# Patient Record
Sex: Female | Born: 1970 | Race: Black or African American | Hispanic: No | Marital: Married | Smoking: Never smoker
Health system: Southern US, Community
[De-identification: ages and names within clinical notes are randomized; demographics above are authoritative.]

## PROBLEM LIST (undated history)

## (undated) DIAGNOSIS — N201 Calculus of ureter: Secondary | ICD-10-CM

## (undated) DIAGNOSIS — F419 Anxiety disorder, unspecified: Secondary | ICD-10-CM

## (undated) DIAGNOSIS — K5900 Constipation, unspecified: Secondary | ICD-10-CM

## (undated) DIAGNOSIS — K589 Irritable bowel syndrome without diarrhea: Secondary | ICD-10-CM

## (undated) DIAGNOSIS — R0600 Dyspnea, unspecified: Secondary | ICD-10-CM

## (undated) DIAGNOSIS — R350 Frequency of micturition: Secondary | ICD-10-CM

## (undated) DIAGNOSIS — Z8619 Personal history of other infectious and parasitic diseases: Secondary | ICD-10-CM

## (undated) DIAGNOSIS — K219 Gastro-esophageal reflux disease without esophagitis: Secondary | ICD-10-CM

## (undated) DIAGNOSIS — T7840XA Allergy, unspecified, initial encounter: Secondary | ICD-10-CM

## (undated) DIAGNOSIS — K5909 Other constipation: Secondary | ICD-10-CM

## (undated) DIAGNOSIS — J302 Other seasonal allergic rhinitis: Secondary | ICD-10-CM

## (undated) DIAGNOSIS — D649 Anemia, unspecified: Secondary | ICD-10-CM

## (undated) DIAGNOSIS — N189 Chronic kidney disease, unspecified: Secondary | ICD-10-CM

## (undated) DIAGNOSIS — D509 Iron deficiency anemia, unspecified: Secondary | ICD-10-CM

## (undated) HISTORY — PX: OTHER SURGICAL HISTORY: SHX169

## (undated) HISTORY — DX: Chronic kidney disease, unspecified: N18.9

## (undated) HISTORY — PX: TUBAL LIGATION: SHX77

## (undated) HISTORY — PX: DILATION AND CURETTAGE OF UTERUS: SHX78

## (undated) HISTORY — DX: Anxiety disorder, unspecified: F41.9

## (undated) HISTORY — DX: Irritable bowel syndrome, unspecified: K58.9

## (undated) HISTORY — DX: Allergy, unspecified, initial encounter: T78.40XA

## (undated) HISTORY — DX: Constipation, unspecified: K59.00

## (undated) HISTORY — DX: Anemia, unspecified: D64.9

## (undated) HISTORY — PX: BREAST LUMPECTOMY: SHX2

## (undated) HISTORY — DX: Gastro-esophageal reflux disease without esophagitis: K21.9

---
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
## 1997-07-23 ENCOUNTER — Other Ambulatory Visit: Admission: RE | Admit: 1997-07-23 | Discharge: 1997-07-23 | Payer: Self-pay | Admitting: Obstetrics and Gynecology

## 1997-07-23 ENCOUNTER — Inpatient Hospital Stay (HOSPITAL_COMMUNITY): Admission: AD | Admit: 1997-07-23 | Discharge: 1997-07-23 | Payer: Self-pay | Admitting: *Deleted

## 1997-08-07 ENCOUNTER — Other Ambulatory Visit: Admission: RE | Admit: 1997-08-07 | Discharge: 1997-08-07 | Payer: Self-pay | Admitting: Obstetrics & Gynecology

## 1997-11-29 ENCOUNTER — Inpatient Hospital Stay (HOSPITAL_COMMUNITY): Admission: AD | Admit: 1997-11-29 | Discharge: 1997-11-29 | Payer: Self-pay | Admitting: Obstetrics and Gynecology

## 1998-01-22 ENCOUNTER — Encounter: Payer: Self-pay | Admitting: Obstetrics & Gynecology

## 1998-01-22 ENCOUNTER — Ambulatory Visit (HOSPITAL_COMMUNITY): Admission: RE | Admit: 1998-01-22 | Discharge: 1998-01-22 | Payer: Self-pay | Admitting: Obstetrics & Gynecology

## 1998-02-16 ENCOUNTER — Inpatient Hospital Stay (HOSPITAL_COMMUNITY): Admission: AD | Admit: 1998-02-16 | Discharge: 1998-02-16 | Payer: Self-pay | Admitting: Obstetrics & Gynecology

## 1998-02-17 ENCOUNTER — Inpatient Hospital Stay (HOSPITAL_COMMUNITY): Admission: AD | Admit: 1998-02-17 | Discharge: 1998-02-19 | Payer: Self-pay | Admitting: Obstetrics & Gynecology

## 1998-03-10 ENCOUNTER — Encounter (HOSPITAL_BASED_OUTPATIENT_CLINIC_OR_DEPARTMENT_OTHER): Payer: Self-pay | Admitting: General Surgery

## 1998-03-12 ENCOUNTER — Ambulatory Visit (HOSPITAL_COMMUNITY): Admission: RE | Admit: 1998-03-12 | Discharge: 1998-03-12 | Payer: Self-pay | Admitting: General Surgery

## 1998-08-19 ENCOUNTER — Emergency Department (HOSPITAL_COMMUNITY): Admission: EM | Admit: 1998-08-19 | Discharge: 1998-08-19 | Payer: Self-pay

## 1998-09-09 ENCOUNTER — Encounter: Payer: Self-pay | Admitting: Emergency Medicine

## 1998-09-09 ENCOUNTER — Emergency Department (HOSPITAL_COMMUNITY): Admission: EM | Admit: 1998-09-09 | Discharge: 1998-09-09 | Payer: Self-pay | Admitting: Emergency Medicine

## 1998-10-15 ENCOUNTER — Encounter: Admission: RE | Admit: 1998-10-15 | Discharge: 1998-10-15 | Payer: Self-pay | Admitting: Family Medicine

## 1998-10-28 ENCOUNTER — Encounter: Admission: RE | Admit: 1998-10-28 | Discharge: 1998-10-28 | Payer: Self-pay | Admitting: Family Medicine

## 1998-12-19 ENCOUNTER — Encounter: Admission: RE | Admit: 1998-12-19 | Discharge: 1998-12-19 | Payer: Self-pay | Admitting: Family Medicine

## 1999-01-13 ENCOUNTER — Encounter: Admission: RE | Admit: 1999-01-13 | Discharge: 1999-01-13 | Payer: Self-pay | Admitting: Sports Medicine

## 1999-02-04 ENCOUNTER — Encounter: Admission: RE | Admit: 1999-02-04 | Discharge: 1999-02-04 | Payer: Self-pay | Admitting: Family Medicine

## 1999-02-04 ENCOUNTER — Encounter: Payer: Self-pay | Admitting: Sports Medicine

## 1999-02-11 ENCOUNTER — Encounter: Admission: RE | Admit: 1999-02-11 | Discharge: 1999-02-11 | Payer: Self-pay | Admitting: Family Medicine

## 1999-08-13 ENCOUNTER — Encounter: Admission: RE | Admit: 1999-08-13 | Discharge: 1999-08-13 | Payer: Self-pay | Admitting: Family Medicine

## 2000-02-26 ENCOUNTER — Encounter: Payer: Self-pay | Admitting: Obstetrics and Gynecology

## 2000-02-26 ENCOUNTER — Inpatient Hospital Stay (HOSPITAL_COMMUNITY): Admission: AD | Admit: 2000-02-26 | Discharge: 2000-02-26 | Payer: Self-pay | Admitting: Obstetrics & Gynecology

## 2000-04-05 ENCOUNTER — Other Ambulatory Visit: Admission: RE | Admit: 2000-04-05 | Discharge: 2000-04-05 | Payer: Self-pay | Admitting: Obstetrics and Gynecology

## 2000-09-05 ENCOUNTER — Inpatient Hospital Stay (HOSPITAL_COMMUNITY): Admission: AD | Admit: 2000-09-05 | Discharge: 2000-09-05 | Payer: Self-pay | Admitting: Obstetrics and Gynecology

## 2000-09-09 ENCOUNTER — Inpatient Hospital Stay (HOSPITAL_COMMUNITY): Admission: AD | Admit: 2000-09-09 | Discharge: 2000-09-11 | Payer: Self-pay | Admitting: Obstetrics and Gynecology

## 2000-09-09 ENCOUNTER — Encounter (INDEPENDENT_AMBULATORY_CARE_PROVIDER_SITE_OTHER): Payer: Self-pay

## 2001-08-21 ENCOUNTER — Encounter: Admission: RE | Admit: 2001-08-21 | Discharge: 2001-08-21 | Payer: Self-pay | Admitting: Family Medicine

## 2001-10-06 ENCOUNTER — Encounter: Admission: RE | Admit: 2001-10-06 | Discharge: 2001-10-06 | Payer: Self-pay | Admitting: Family Medicine

## 2002-01-16 ENCOUNTER — Emergency Department (HOSPITAL_COMMUNITY): Admission: EM | Admit: 2002-01-16 | Discharge: 2002-01-16 | Payer: Self-pay | Admitting: Emergency Medicine

## 2002-01-16 ENCOUNTER — Encounter: Payer: Self-pay | Admitting: *Deleted

## 2002-05-16 ENCOUNTER — Encounter: Admission: RE | Admit: 2002-05-16 | Discharge: 2002-05-16 | Payer: Self-pay | Admitting: Sports Medicine

## 2002-09-03 ENCOUNTER — Encounter: Admission: RE | Admit: 2002-09-03 | Discharge: 2002-09-03 | Payer: Self-pay | Admitting: Family Medicine

## 2002-11-29 ENCOUNTER — Encounter: Admission: RE | Admit: 2002-11-29 | Discharge: 2002-11-29 | Payer: Self-pay | Admitting: Family Medicine

## 2003-03-11 ENCOUNTER — Ambulatory Visit (HOSPITAL_COMMUNITY): Admission: RE | Admit: 2003-03-11 | Discharge: 2003-03-11 | Payer: Self-pay | Admitting: Family Medicine

## 2003-03-11 ENCOUNTER — Encounter: Admission: RE | Admit: 2003-03-11 | Discharge: 2003-03-11 | Payer: Self-pay | Admitting: Family Medicine

## 2003-06-28 ENCOUNTER — Encounter: Admission: RE | Admit: 2003-06-28 | Discharge: 2003-06-28 | Payer: Self-pay | Admitting: Family Medicine

## 2004-03-25 ENCOUNTER — Ambulatory Visit: Payer: Self-pay | Admitting: Sports Medicine

## 2004-05-09 ENCOUNTER — Emergency Department (HOSPITAL_COMMUNITY): Admission: EM | Admit: 2004-05-09 | Discharge: 2004-05-09 | Payer: Self-pay | Admitting: Emergency Medicine

## 2004-05-18 ENCOUNTER — Ambulatory Visit: Payer: Self-pay | Admitting: Family Medicine

## 2005-04-01 ENCOUNTER — Encounter (INDEPENDENT_AMBULATORY_CARE_PROVIDER_SITE_OTHER): Payer: Self-pay | Admitting: *Deleted

## 2005-04-28 ENCOUNTER — Ambulatory Visit: Payer: Self-pay | Admitting: Family Medicine

## 2005-06-01 ENCOUNTER — Ambulatory Visit: Payer: Self-pay | Admitting: Family Medicine

## 2006-02-21 ENCOUNTER — Ambulatory Visit: Payer: Self-pay

## 2006-03-31 DIAGNOSIS — F411 Generalized anxiety disorder: Secondary | ICD-10-CM | POA: Insufficient documentation

## 2006-04-01 ENCOUNTER — Encounter (INDEPENDENT_AMBULATORY_CARE_PROVIDER_SITE_OTHER): Payer: Self-pay | Admitting: *Deleted

## 2006-06-08 ENCOUNTER — Ambulatory Visit: Payer: Self-pay | Admitting: Family Medicine

## 2006-06-08 ENCOUNTER — Encounter: Payer: Self-pay | Admitting: Family Medicine

## 2006-06-08 ENCOUNTER — Telehealth: Payer: Self-pay | Admitting: *Deleted

## 2006-06-08 DIAGNOSIS — N946 Dysmenorrhea, unspecified: Secondary | ICD-10-CM | POA: Insufficient documentation

## 2006-06-08 DIAGNOSIS — E669 Obesity, unspecified: Secondary | ICD-10-CM | POA: Insufficient documentation

## 2006-06-08 LAB — CONVERTED CEMR LAB
Cholesterol: 163 mg/dL (ref 0–200)
HCT: 35.9 %
Hemoglobin: 12 g/dL
MCV: 82.7 fL
Platelets: 212 10*3/uL
RBC: 4.34 M/uL

## 2006-06-15 ENCOUNTER — Encounter: Payer: Self-pay | Admitting: Family Medicine

## 2006-07-06 ENCOUNTER — Telehealth: Payer: Self-pay | Admitting: *Deleted

## 2006-07-11 ENCOUNTER — Ambulatory Visit: Payer: Self-pay | Admitting: Sports Medicine

## 2006-07-11 ENCOUNTER — Encounter: Payer: Self-pay | Admitting: Family Medicine

## 2006-07-11 LAB — CONVERTED CEMR LAB: TSH: 1.621 microintl units/mL (ref 0.350–5.50)

## 2007-06-16 ENCOUNTER — Encounter (INDEPENDENT_AMBULATORY_CARE_PROVIDER_SITE_OTHER): Payer: Self-pay | Admitting: Family Medicine

## 2007-06-16 ENCOUNTER — Encounter: Payer: Self-pay | Admitting: Family Medicine

## 2007-06-16 ENCOUNTER — Ambulatory Visit: Payer: Self-pay | Admitting: Family Medicine

## 2007-06-16 DIAGNOSIS — L659 Nonscarring hair loss, unspecified: Secondary | ICD-10-CM | POA: Insufficient documentation

## 2007-06-16 DIAGNOSIS — N920 Excessive and frequent menstruation with regular cycle: Secondary | ICD-10-CM

## 2007-06-16 LAB — CONVERTED CEMR LAB
Hemoglobin: 12.2 g/dL
TSH: 1.886 microintl units/mL (ref 0.350–5.50)

## 2007-06-20 ENCOUNTER — Encounter: Payer: Self-pay | Admitting: Family Medicine

## 2007-06-20 ENCOUNTER — Ambulatory Visit (HOSPITAL_COMMUNITY): Admission: RE | Admit: 2007-06-20 | Discharge: 2007-06-20 | Payer: Self-pay | Admitting: Family Medicine

## 2007-06-22 ENCOUNTER — Telehealth: Payer: Self-pay | Admitting: Family Medicine

## 2007-10-30 ENCOUNTER — Telehealth: Payer: Self-pay | Admitting: *Deleted

## 2007-11-02 ENCOUNTER — Ambulatory Visit: Payer: Self-pay | Admitting: Family Medicine

## 2008-07-03 ENCOUNTER — Ambulatory Visit: Payer: Self-pay | Admitting: Family Medicine

## 2008-07-03 ENCOUNTER — Encounter (INDEPENDENT_AMBULATORY_CARE_PROVIDER_SITE_OTHER): Payer: Self-pay | Admitting: Family Medicine

## 2008-07-03 ENCOUNTER — Encounter: Payer: Self-pay | Admitting: Family Medicine

## 2008-07-08 ENCOUNTER — Ambulatory Visit (HOSPITAL_COMMUNITY): Admission: RE | Admit: 2008-07-08 | Discharge: 2008-07-08 | Payer: Self-pay | Admitting: Family Medicine

## 2008-07-08 ENCOUNTER — Encounter: Payer: Self-pay | Admitting: Family Medicine

## 2008-07-09 ENCOUNTER — Ambulatory Visit: Payer: Self-pay | Admitting: Family Medicine

## 2008-07-09 ENCOUNTER — Encounter: Payer: Self-pay | Admitting: Family Medicine

## 2008-07-11 ENCOUNTER — Telehealth: Payer: Self-pay | Admitting: *Deleted

## 2008-07-24 ENCOUNTER — Encounter: Admission: RE | Admit: 2008-07-24 | Discharge: 2008-07-24 | Payer: Self-pay | Admitting: Family Medicine

## 2008-08-12 ENCOUNTER — Encounter: Payer: Self-pay | Admitting: Family Medicine

## 2008-08-29 ENCOUNTER — Telehealth (INDEPENDENT_AMBULATORY_CARE_PROVIDER_SITE_OTHER): Payer: Self-pay | Admitting: Family Medicine

## 2009-04-12 ENCOUNTER — Emergency Department (HOSPITAL_COMMUNITY): Admission: EM | Admit: 2009-04-12 | Discharge: 2009-04-12 | Payer: Self-pay | Admitting: Emergency Medicine

## 2009-04-12 ENCOUNTER — Telehealth: Payer: Self-pay | Admitting: Family Medicine

## 2009-04-23 ENCOUNTER — Ambulatory Visit: Payer: Self-pay | Admitting: Family Medicine

## 2009-04-23 ENCOUNTER — Ambulatory Visit (HOSPITAL_COMMUNITY): Admission: RE | Admit: 2009-04-23 | Discharge: 2009-04-23 | Payer: Self-pay | Admitting: Family Medicine

## 2009-04-23 DIAGNOSIS — R002 Palpitations: Secondary | ICD-10-CM | POA: Insufficient documentation

## 2009-04-29 ENCOUNTER — Encounter: Payer: Self-pay | Admitting: Family Medicine

## 2009-07-24 ENCOUNTER — Ambulatory Visit: Payer: Self-pay | Admitting: Family Medicine

## 2009-07-24 ENCOUNTER — Encounter: Payer: Self-pay | Admitting: Family Medicine

## 2009-08-14 ENCOUNTER — Ambulatory Visit: Payer: Self-pay | Admitting: Family Medicine

## 2009-08-14 ENCOUNTER — Encounter: Payer: Self-pay | Admitting: Family Medicine

## 2010-03-05 NOTE — Letter (Signed)
Summary: Generic Letter  [facility]  [address]   [place], [place] [address]   Phone: [phone]  Fax: [phone]    08/14/2009  MERRILLYN ACKERLEY [address] [place], [place]  [address]  To Whom It May Concern Re: Ms Giamarie Bueche Tom-Jonhson:  Ms Laural Benes may return to work at full duty on July 25th, 2011 which is earlier than expected from previous FMLA.  If you need further information please do not hesitate to contact Ms Tom-Johnson or the [facility].     Sincerely,   Ancil Boozer  MD

## 2010-03-05 NOTE — Assessment & Plan Note (Signed)
Summary: fmla   Vital Signs:  Patient profile:   40 year old female Height:      64.5 inches Weight:      196.5 pounds BMI:     33.33 Temp:     98.6 degrees F oral Pulse rate:   83 / minute BP sitting:   108 / 70  (left arm) Cuff size:   regular  Vitals Entered By: Garen Grams LPN (July 24, 2009 8:45 AM) CC: depression, anxiety Is Patient Diabetic? No Pain Assessment Patient in pain? no        Primary Care Provider:  Ancil Boozer  MD  CC:  depression and anxiety.  History of Present Illness: depression/anxiety: has worsened recently with several stressors: husband has had to go back to [place] until end of july due to illness in his father Tamara "Daphne"'s father in law) and she now is home alone with the kids and has no one to watch them when she is suppsed to work.  she also has nursing boards coming up soon.  as a result she's had increase in her panic attacks which consisted of chest pain primarily - this is relieved with xanax use periodically but she prefers to not use it.  she also has noticed down mood, decreased energy, early fatigue, only fair sleep (and requiries a glass of wine to get to sleep) and she has been doing nothing for herself that she is interested in. she denies guilt just feels very overwhelmed.  she denies suicidal or homocidal ideation. she requests FMLA paperwork completed today to get some time off to start medication, potentially get counseling and to rest so that her depression/anxiety can calm down some.   Habits & Providers  Alcohol-Tobacco-Diet     Tobacco Status: never  Current Medications (verified): 1)  Fluticasone Propionate 50 Mcg/act Susp (Fluticasone Propionate) .... 2 Sprays Each Nostril Daily For Allergies/congestion 2)  Cetirizine Hcl 10 Mg Tabs (Cetirizine Hcl) .Marland Kitchen.. 1 By Mouth Once Daily For Allergies. 3)  Alprazolam 0.5 Mg Tabs (Alprazolam) .Marland Kitchen.. 1 By Mouth Three Times A Day As Needed Severe Anxiety/panic Attack 4)  Citalopram  Hydrobromide 40 Mg Tabs (Citalopram Hydrobromide) .... Titrate Up As Directed To Goal 1 Tablet Once Daily  Allergies (verified): No Known Drug Allergies  Past History:  Past Medical History: cervical mass 1X1cm at 3 o`clock 03/07,  h/o anal fissure,  H/o chronic pelvic pain,  SVD x 3, SAB x 2 ALOPECIA (ICD-704.00) MENORRHAGIA (ICD-626.2) OBESITY (ICD-278.00) ANXIETY (ICD-300.00) with panic attack NONSPEC REACT TUBERCULIN SKN TEST W/O ACTV TB (ICD-795.5)   h/o med trials for depression/anxiety: prozac, lexapro, effexor.  discontinued due to side effects  Review of Systems       per HPI  Physical Exam  General:  Well appearing, overweight.  VS noted.  anxious and overwhelmed appearing   Impression & Recommendations:  Problem # 1:  ANXIETY (ICD-300.00) Assessment Deteriorated  FMLA completed for being off until 08/31/09.   start citalopram today encouraged counseling (card given to schedule with Dr Pascal Lux) f/u 3-4 weeks or sooner if worsening  of a 20 minute visit 17 minutes spent on counseling and coordination of care   Her updated medication list for this problem includes:    Alprazolam 0.5 Mg Tabs (Alprazolam) .Marland Kitchen... 1 by mouth three times a day as needed severe anxiety/panic attack    Citalopram Hydrobromide 40 Mg Tabs (Citalopram hydrobromide) .Marland Kitchen... Titrate up as directed to goal 1 tablet once daily  Orders: [facility]- Est  Level  3 ([address]213)  Complete Medication List: 1)  Fluticasone Propionate 50 Mcg/act Susp (Fluticasone propionate) .... 2 sprays each nostril daily for allergies/congestion 2)  Cetirizine Hcl 10 Mg Tabs (Cetirizine hcl) .Marland Kitchen.. 1 by mouth once daily for allergies. 3)  Alprazolam 0.5 Mg Tabs (Alprazolam) .Marland Kitchen.. 1 by mouth three times a day as needed severe anxiety/panic attack 4)  Citalopram Hydrobromide 40 Mg Tabs (Citalopram hydrobromide) .... Titrate up as directed to goal 1 tablet once daily  Patient Instructions: 1)  Please get counseling with Dr Pascal Lux.   Her card is attached.  2)  Start the medicine as prescribed. 3)  Please follow up with me in 3-4 weeks to see how your mood is doing.  Prescriptions: CITALOPRAM HYDROBROMIDE 40 MG TABS (CITALOPRAM HYDROBROMIDE) titrate up as directed to goal 1 tablet once daily  #30 x 2   Entered and Authorized by:   Ancil Boozer  MD   Signed by:   Ancil Boozer  MD on 07/24/2009   Method used:   Handwritten   RxID:   [id number]

## 2010-03-05 NOTE — Assessment & Plan Note (Signed)
Summary: F/U/KH   Vital Signs:  Patient profile:   40 year old female Weight:      197 pounds BMI:     33.41 Temp:     98.4 degrees F oral Pulse rate:   80 / minute BP sitting:   111 / 70  (left arm) Cuff size:   large  Vitals Entered By: Jimmy Footman, CMA (August 14, 2009 9:35 AM) CC: f/u mood Is Patient Diabetic? No Pain Assessment Patient in pain? no        Primary Care Provider:  Ancil Boozer  MD  CC:  f/u mood.  History of Present Illness: depression/anxiety: overall improving.  husband is now back.  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  she never took prescribed medication.  she still is having some problems sleeping but these have been chronic.  she does report that she is often watching TV in bed or sleeping with the TV on.  she denies SI/HI. denies feeling guilty over anything.    Habits & Providers  Alcohol-Tobacco-Diet     Tobacco Status: never  Current Medications (verified): 1)  Fluticasone Propionate 50 Mcg/act Susp (Fluticasone Propionate) .... 2 Sprays Each Nostril Daily For Allergies/congestion 2)  Cetirizine Hcl 10 Mg Tabs (Cetirizine Hcl) .Marland Kitchen.. 1 By Mouth Once Daily For Allergies. 3)  Alprazolam 0.5 Mg Tabs (Alprazolam) .Marland Kitchen.. 1 By Mouth Three Times A Day As Needed Severe Anxiety/panic Attack  Allergies (verified): No Known Drug Allergies  Past History:  Past medical, surgical, family and social histories (including risk factors) reviewed for relevance to current acute and chronic problems.  Past Medical History: cervical mass 1X1cm at 3 o`clock 03/07,  h/o anal fissure,  H/o chronic pelvic pain,  SVD x 3, SAB x 2 ALOPECIA (ICD-704.00) MENORRHAGIA (ICD-626.2) OBESITY (ICD-278.00) ANXIETY (ICD-300.00) with panic attack, palpitations NONSPEC REACT TUBERCULIN SKN TEST W/O ACTV TB (ICD-795.5)   h/o med trials for depression/anxiety: prozac, lexapro, effexor.  discontinued due to side effects.    Past Surgical History: Reviewed  history from 03/31/2006 and no changes required. BTL -, Cr = 0.9 - 03/04/2004, ECG: normal - 03/05/2003, Excision of L breast lump--benign -, Mandible surgery -  Family History: Reviewed history from 03/31/2006 and no changes required. 1 half-brother, 1 half-sister on Mom`s side--healthy, 2 half-brother, 1 half-sister on Dad`s side--healthy, Children--healthy, Dad--glaucoma, Maternal aunts--1 died of breast cancer at 6; 1 died of ovarian cancer at 43, Mom--glaucoma, No FHx of CAD, PGM--died of pancreatic cancer  Social History: Reviewed history from 06/16/2007 and no changes required. Lives with husband, 3 children (born in 2002, 2000, 1998); Works full-time in [employer]; Goes to school full-time for nursing [employer]); From [place] in 1995--Mother now in the states with a visa, no tobacco or illicit drugs.  daily glass of red wine to help with sleep  Review of Systems       per HPI  Physical Exam  General:  Well appearing, overweight.  VS noted.  less anxious appearing.   Psych:  Oriented X3, normally interactive, good eye contact, and mildly anxious   Impression & Recommendations:  Problem # 1:  ANXIETY (ICD-300.00) Assessment Improved  continued to encourage counseling.  discussed good sleep hygiene.    completed note for return to work 1 week earlier than anticipated on previous FMLA paperwork.   of a 15 minute visit entire visit was spent on counseling and coordiation of care.   Her updated medication list for this problem includes:  Alprazolam 0.5 Mg Tabs (Alprazolam) .Marland Kitchen... 1 by mouth three times a day as needed severe anxiety/panic attack  Orders: [facility]- Est Level  3 ([address])  Complete Medication List: 1)  Fluticasone Propionate 50 Mcg/act Susp (Fluticasone propionate) .... 2 sprays each nostril daily for allergies/congestion 2)  Cetirizine Hcl 10 Mg Tabs (Cetirizine hcl) .Marland Kitchen.. 1 by mouth once daily for allergies. 3)  Alprazolam 0.5 Mg Tabs (Alprazolam) .Marland Kitchen.. 1  by mouth three times a day as needed severe anxiety/panic attack  Patient Instructions: 1)  I am glad many of your stressors are winding down.  It will be good for you to get back to work.  2)  For your sleep: be sure you are in a quiet dark room.  No TV or reading in the bedroom. - let your mind rest! 3)  Your new doctor is Dr Alvia Grove.

## 2010-03-05 NOTE — Progress Notes (Signed)
Summary: ED visit   Phone Note Other Incoming   Caller: Irving Burton PA at [facility] ED Summary of Call: Pt presented to ED today with c/o chest pain x 1 week and palpataions.  ED doc thought c/p prob due to anxiety, but pt did have a brief, witnessed (less than 10 sec) run of tachycardia with HR  ~150's.  ED doc did not want to admit pt, but thought f/u and possibly holter monitor was appropriate.  All labs and PE WNL.  Pt insttructed to f/u with PCP.

## 2010-03-05 NOTE — Assessment & Plan Note (Signed)
Summary: chest discomfort,df   Vital Signs:  Patient profile:   40 year old female Height:      64.5 inches Weight:      203.2 pounds BMI:     34.46 Temp:     98.9 degrees F oral Pulse rate:   103 / minute BP sitting:   104 / 68  (right arm) Cuff size:   regular  Vitals Entered By: Garen Grams LPN (April 23, 2009 11:13 AM) CC: chest discomfort on/off Is Patient Diabetic? No Pain Assessment Patient in pain? no        Primary Care Provider:  Ancil Boozer  MD  CC:  chest discomfort on/off.  History of Present Illness: chest discomfort: intermittent.  has been going on now for about 2 months that he is noticed.  has happened about 3-4 times in total.  lasts only a few minutes.  symptoms consist of palpitations, lightheadedness, chest pain that radiates to left scapula and is stabbing in nature, shortness of breath, left arm numbness and tingling.  she denies nausea.  she has noticed palpitations with exercise but no chest pain.  seems to occur with worsening stress - she is under a great deal between work, school and family but states she is safe at home.  to help her pain she has tried aspirin, relaxation techniques such as deep breathing.  she has been seen once in ER and report and lab work has been reviewed - all of which was normal and she was dx with anxiety/panic.  she was prescribed xanax which has helped.  she denies family history of heart attacks or cardiac disease but does endorce family history of anxiety/depression  Habits & Providers  Alcohol-Tobacco-Diet     Tobacco Status: never  Current Medications (verified): 1)  Fluticasone Propionate 50 Mcg/act Susp (Fluticasone Propionate) .... 2 Sprays Each Nostril Daily For Allergies/congestion 2)  Cetirizine Hcl 10 Mg Tabs (Cetirizine Hcl) .Marland Kitchen.. 1 By Mouth Once Daily For Allergies. 3)  Alprazolam 0.5 Mg Tabs (Alprazolam) .Marland Kitchen.. 1 By Mouth Three Times A Day As Needed Severe Anxiety/panic Attack  Allergies (verified): No  Known Drug Allergies  Past History:  Past medical, surgical, family and social histories (including risk factors) reviewed for relevance to current acute and chronic problems.  Review of Systems       per HPI.  no abdominal pain.,  no changes in bowel habits.  no fevers.   Physical Exam  General:  Well appearing, overweight.  VS noted - very mild tachycardia Lungs:  normal respiratory effort and no intercostal retractions.   Heart:  normal rate, regular rhythm, and no gallop.     Impression & Recommendations:  Problem # 1:  PALPITATIONS (ICD-785.1) Assessment New discussed that i think most of her symptoms are related to anxiety and not heart problems.  to complete workup will have patient come in for FLP one morning.  otherwise only risk factor is being overweight.  see #2   Orders: [facility]KG- [facility] (EKG) [facility]- Est  Level 4 ([address]40)  Problem # 2:  ANXIETY (ICD-300.00) Assessment: Deteriorated will plan on continuing the as needed xanax for now.  encouraged her to try to get appt with dr Pascal Lux for some cognitive behavorial therapy but she declined.  see patient instructions.   Her updated medication list for this problem includes:    Alprazolam 0.5 Mg Tabs (Alprazolam) .Marland Kitchen... 1 by mouth three times a day as needed severe anxiety/panic attack  Orders: [facility]- Est  Level 4 (  [address])  Complete Medication List: 1)  Fluticasone Propionate 50 Mcg/act Susp (Fluticasone propionate) .... 2 sprays each nostril daily for allergies/congestion 2)  Cetirizine Hcl 10 Mg Tabs (Cetirizine hcl) .Marland Kitchen.. 1 by mouth once daily for allergies. 3)  Alprazolam 0.5 Mg Tabs (Alprazolam) .Marland Kitchen.. 1 by mouth three times a day as needed severe anxiety/panic attack  Other Orders: Future Orders: Lipid-[facility] ([id number]) ... 04/02/2010  Patient Instructions: 1)  Please make a lab appointment one morning when you have had nothing to eat or drink except water or black coffee to get your choleserol checked.  2)  Please make a  follow up appointment with me in 1 month. 3)  Check your pulse when you have an episode and write it down with the day and time and bring this to your next visit. 4)  Consider calling Dr Pascal Lux for an appointment to learn techniques to help get you through stressful situations. Prescriptions: ALPRAZOLAM 0.5 MG TABS (ALPRAZOLAM) 1 by mouth three times a day as needed severe anxiety/panic attack  #45 x 0   Entered and Authorized by:   Ancil Boozer  MD   Signed by:   Ancil Boozer  MD on 04/23/2009   Method used:   Print then Give to Patient   RxID:   [id number]

## 2010-03-05 NOTE — Letter (Signed)
Summary: FMLA  FMLA   Imported By: Clydell Hakim 07/30/2009 16:53:44  _____________________________________________________________________  External Attachment:    Type:   Image     Comment:   External Document

## 2010-03-13 ENCOUNTER — Encounter: Payer: Self-pay | Admitting: *Deleted

## 2010-04-24 LAB — URINE MICROSCOPIC-ADD ON

## 2010-04-24 LAB — CBC
HCT: 35.7 % — ABNORMAL LOW (ref 36.0–46.0)
Hemoglobin: 12 g/dL (ref 12.0–15.0)
RDW: 14.6 % (ref 11.5–15.5)

## 2010-04-24 LAB — BASIC METABOLIC PANEL
CO2: 23 mEq/L (ref 19–32)
GFR calc non Af Amer: >60 mL/min (ref >60)
Glucose, Bld: 90 mg/dL (ref 70–99)
Potassium: 3.7 mEq/L (ref 3.5–5.1)
Sodium: 134 mEq/L — ABNORMAL LOW (ref 135–145)

## 2010-04-24 LAB — URINALYSIS, ROUTINE W REFLEX MICROSCOPIC
Bilirubin Urine: NEGATIVE
Glucose, UA: NEGATIVE mg/dL
Ketones, ur: NEGATIVE mg/dL
pH: 7 (ref 5.0–8.0)

## 2010-04-24 LAB — POCT CARDIAC MARKERS
CKMB, poc: <1 ng/mL — ABNORMAL LOW (ref 1.0–8.0)
Myoglobin, poc: 60.9 ng/mL (ref 12–200)

## 2010-04-24 LAB — DIFFERENTIAL
Basophils Absolute: 0.1 10*3/uL (ref 0.0–0.1)
Eosinophils Relative: 1 % (ref 0–5)
Lymphocytes Relative: 46 % (ref 12–46)
Monocytes Absolute: 0.5 10*3/uL (ref 0.1–1.0)

## 2010-05-21 ENCOUNTER — Ambulatory Visit: Payer: Self-pay | Admitting: Family Medicine

## 2010-06-19 NOTE — H&P (Signed)
[facility]  Patient:    Tamara, Watson                   MRN: [id number] Adm. Date:  [id number] Disc. Date: [id number] Attending:  Shaune Spittle Dictator:   Nigel Bridgeman, C.N.M.                         History and Physical  HISTORY OF PRESENT ILLNESS:   Ms. Tamara Watson is a 40 year old gravida 5, para 2-0-2-2 at 39-5/7 weeks, who presents with bleeding at home upon awakening this morning.  She reports irregular uterine contractions, positive fetal movement and no leaking.  Cervix has been 1 cm in the office.  She denies any trauma.  PRENATAL LABORATORY DATA:     Blood type is B-positive, Rh-antibody negative. VDRL nonreactive.  Rubella titer positive.  Hepatitis B surface antigen negative.  HIV nonreactive.  Sickle cell test was negative.  GC and Chlamydia cultures were negative in the first trimester.  Pap was normal.  Glucose challenge was normal.  AFP was declined.  Hemoglobin upon entry into practice was 12.7; it was 12.5 at 28 weeks.  EDC of September 11, 2000 was established by ultrasound in the first trimester and confirmed at approximately 18 weeks. Patient did not enter care until approximately 17 weeks.  HISTORY OF PRESENT PREGNANCY:  Patient entered care at 17 weeks.  She had first and second trimester bleeding.  She fell in the shower at 17-1/2 weeks and was evaluated at [facility].  She had been placed on bedrest for bleeding.  She returned to work part-time at 21 weeks.  She had a positive urine culture at 29 weeks.  She was out of work beginning at approximately 28 weeks secondary to preterm labor.  She decided that she would like to have a tubal ligation during the course of this subsequent delivery.  The rest of her pregnancy was uncomplicated.  In 1996, she had a spontaneous miscarriage in the first trimester.  In 1997, she had a spontaneous miscarriage in the first trimester; with both of these, she had a D&C.   In 1998, she had a vaginal delivery of a female infant, weight 6 pounds 6 ounces, at 39-1/[redacted] weeks gestation.  She was in labor 12 hours.  She did have a delayed postpartum hemorrhage.  In January of 2000, she had a vaginal birth of a female infant, weight 6 pounds 3 ounces, at 40-2/7 weeks.  She had an epidural anesthesia with that baby.  She had no complications.  MEDICAL HISTORY:              She had an IUD after her last child until August of 2000.  Her only other hospitalizations were for childbirth x 2.  ALLERGIES:                    She has no known medication allergies.  FAMILY HISTORY:               Unremarkable for any medical problems.  SOCIAL HISTORY:               Patient is married to the father of the baby; he is involved and supportive.  His name is Casimiro Needle Tom-Johnson.  Patient is of African ethnicity.  She has been employed as a Education administrator. Her husband is also employed at ______ [employer].  She has been followed by the certified  nurse midwife service at [facility]  She denies any alcohol, drug or tobacco use during this pregnancy.  PHYSICAL EXAMINATION:  VITAL SIGNS:                  Stable.  Patient is afebrile.  HEENT:                        Within normal limits.  LUNGS:                        Bilateral breath sounds are clear.  HEART:                        Regular rate and rhythm without murmur.  BREASTS:                      Soft and nontender.  ABDOMEN:                      Fundal height is approximately 38 cm.  Estimated fetal weight is 6-1/2 to 7 pounds.  Uterine contractions are very irregular and mild with some irritability in between.  Fetal heart rate is reactive with no decelerations.  PELVIC:                       Speculum exam reveals a moderate amount of bright red blood in the vault.  Cervix is 1 cm, 80%, vertex at a -2 station.  EXTREMITIES:                  Deep tendon reflexes are 2+ without clonus. There is a  trace edema noted.  IMPRESSION:                   1. Intrauterine pregnancy at 39-5/7 weeks.                               2. Early labor with show versus third trimester                                  bleeding.                               3. Reassuring fetal heart rate status.  PLAN:                         1. Admitted to the [facility]                                  [facility] for 23-hour observation per                                  consult with Dr. Janine Limbo secondary                                  to third trimester bleeding.  2. Continuous electronic fetal monitoring.                               3. Will reevaluate for labor and other findings                                  later today or as needed, based on patients                                  circumstances. DD:  09/09/00 TD:  09/09/00 Job: [id number]

## 2010-06-19 NOTE — Discharge Summary (Signed)
[facility]  Patient:    Tamara Watson, Tamara Watson                   MRN: [id number] Adm. Date:  [id number] Disc. Date: 09/11/00 Attending:  Leonard Schwartz Dictator:   Philipp Deputy, C.N.M.                           Discharge Summary  DATE OF BIRTH:                12/13/1970.  ADMITTING DIAGNOSES:          1. Intrauterine pregnancy at term.                               2. Desires postpartum bilateral tubal ligation.  DISCHARGE DIAGNOSES:          1. Intrauterine pregnancy at term.                               2. Normal spontaneous vaginal birth.                               3. Postpartum sterilization.  PROCEDURES:                   1. Normal spontaneous vaginal birth.                               2. Postpartum bilateral tubal ligation.  HISTORY:                      [name] is a 40 year old, gravida 5, para 2-0-2-2, who presented with vaginal bleeding on September 09, 2000. She was initially admitted for observation of early labor with bloody show versus third trimester bleeding. While she was here, she progressed in dilatation from 1 cm to 3-4 cm and was admitted for early labor. Her pregnancy had been remarkable for (1) two SABs, (2) first and second trimester bleeding,      (3) questionable last menstrual period, (4) desires bilateral tubal ligation, (5) preterm labor this pregnancy without cervical change, and (6) group B strep negative.  HOSPITAL COURSE:              The patient progressed to a normal spontaneous vaginal birth over an intact perineum. The infant was a viable female "Melanee Spry". His weight was 7 pounds 7 ounces with Apgars of 9 and 9. The patient was bottle feeding. Hemoglobin was 11.2 on day #1 postpartum. Patient desires postpartum bilateral tubal ligation. This was performed by Dr. Stefano Gaul on day #1 postpartum without complications. By day #2 postpartum the patient was doing well and was deemed to have received the full  benefit of her hospital stay. She was discharged home.  DISCHARGE INSTRUCTIONS:       Instructions are per [facility] OB/GYN handout.  DISCHARGE MEDICATIONS:        1. Motrin 600 mg p.o. q.6h. p.r.n. pain.                               2. Tylox one to two p.o. q.3-4h. p.r.n. pain.  DISCHARGE FOLLOWUP:  Followup will occur at six weeks postpartum at [facility]. DD:  09/11/00 TD:  09/11/00 Job: [id number]

## 2011-05-06 ENCOUNTER — Encounter: Payer: Self-pay | Admitting: Family Medicine

## 2011-05-06 ENCOUNTER — Ambulatory Visit (INDEPENDENT_AMBULATORY_CARE_PROVIDER_SITE_OTHER): Payer: Managed Care, Other (non HMO) | Admitting: Family Medicine

## 2011-05-06 ENCOUNTER — Other Ambulatory Visit (HOSPITAL_COMMUNITY)
Admission: RE | Admit: 2011-05-06 | Discharge: 2011-05-06 | Disposition: A | Discharge status: Home / Self Care | Payer: Managed Care, Other (non HMO) | Source: Ambulatory Visit | Attending: Family Medicine | Admitting: Family Medicine

## 2011-05-06 VITALS — BP 104/63 | HR 101 | Temp 98.5°F | Ht 64.5 in | Wt 214.0 lb

## 2011-05-06 DIAGNOSIS — Z01419 Encounter for gynecological examination (general) (routine) without abnormal findings: Secondary | ICD-10-CM | POA: Insufficient documentation

## 2011-05-06 DIAGNOSIS — Z124 Encounter for screening for malignant neoplasm of cervix: Secondary | ICD-10-CM

## 2011-05-06 DIAGNOSIS — Z0000 Encounter for general adult medical examination without abnormal findings: Secondary | ICD-10-CM

## 2011-05-06 DIAGNOSIS — E669 Obesity, unspecified: Secondary | ICD-10-CM

## 2011-05-06 DIAGNOSIS — D509 Iron deficiency anemia, unspecified: Secondary | ICD-10-CM

## 2011-05-06 DIAGNOSIS — N946 Dysmenorrhea, unspecified: Secondary | ICD-10-CM

## 2011-05-06 NOTE — Progress Notes (Signed)
  Subjective:    Patient ID: Tamara Watson, female    DOB: [birth year], 41 y.o.   MRN: [id number]  HPI  Tamara Watson comes in for her annual exam.  She has not been here since 2010.    Obesity- gaining weight, feeling tired all the time.  Has recently started going to Bariatric clinic, found to be anemic.  Started Iron, as well as phentermine.   Headaches- Patient is a Engineer, civil (consulting), works night shifts.  She says she has headaches at the end of her shift in the morning a few days a week. They are frontal headaches, sometimes associated with nausea and light sensitivity, but not all the time.  No aura.  She does not like to take medications, so she has only taken tylenol for the headaches once or twice.    Dysfunctional Uterine Bleeding- Been worse past few months (also found to be anemic).  She says periods are heavy with clots, and a few days after her period ends she has a sensation of fullness and heaviness in her pelvis.   Review of Systems     Objective:   Physical Exam  BP 104/63  Pulse 101  Temp(Src) 98.5 F (36.9 C) (Oral)  Ht 5' 4.5" (1.638 m)  Wt 214 lb (97.07 kg)  BMI 36.17 kg/m2  LMP 04/15/2011 General appearance: alert, cooperative and no distress Head: Normocephalic, without obvious abnormality, atraumatic Lungs: clear to auscultation bilaterally Heart: regular rate and rhythm, S1, S2 normal, no murmur, click, rub or gallop Abdomen: soft, non-tender; bowel sounds normal; no masses,  no organomegaly Pelvic: cervix normal in appearance, external genitalia normal, no adnexal masses or tenderness, no cervical motion tenderness, rectovaginal septum normal, uterus normal size, shape, and consistency and vagina normal without discharge Extremities: extremities normal, atraumatic, no cyanosis or edema Pulses: 2+ and symmetric Skin: Skin color, texture, turgor normal. No rashes or lesions      Assessment & Plan:

## 2011-05-06 NOTE — Patient Instructions (Signed)
It was nice to meet you.  Please ask the Bariatric clinic to send me their notes.  I will send you a letter with your pap smear.  The office will contact you with the GYN referral.

## 2011-05-07 DIAGNOSIS — D509 Iron deficiency anemia, unspecified: Secondary | ICD-10-CM | POA: Insufficient documentation

## 2011-05-07 DIAGNOSIS — Z0000 Encounter for general adult medical examination without abnormal findings: Secondary | ICD-10-CM | POA: Insufficient documentation

## 2011-05-07 NOTE — Assessment & Plan Note (Signed)
Pap done today, patient up to date on tetanus and flu shots.

## 2011-05-07 NOTE — Assessment & Plan Note (Signed)
Dx by Bariatric clinic, on iron replacement.  Possibly due to heavy menstrual bleeding.

## 2011-05-07 NOTE — Assessment & Plan Note (Signed)
Patient with symptomatic anemia- possibly due to heavy bleeding.  She has had this problem before, but it is worse lately.  Will obtain pelvic US to eval for fibroids and uterine lining.  Will also refer her to her previous OB/GYN as that is her preference for evaluation.

## 2011-05-07 NOTE — Assessment & Plan Note (Signed)
Patient on phentermine per Bariatric clinic- will ask them to send records.

## 2011-05-11 ENCOUNTER — Ambulatory Visit (HOSPITAL_COMMUNITY): Payer: Managed Care, Other (non HMO)

## 2011-05-13 ENCOUNTER — Encounter: Payer: Self-pay | Admitting: Family Medicine

## 2011-05-14 ENCOUNTER — Ambulatory Visit (HOSPITAL_COMMUNITY)
Admission: RE | Admit: 2011-05-14 | Discharge: 2011-05-14 | Disposition: A | Discharge status: Home / Self Care | Payer: Managed Care, Other (non HMO) | Source: Ambulatory Visit | Attending: Family Medicine | Admitting: Family Medicine

## 2011-05-14 DIAGNOSIS — N946 Dysmenorrhea, unspecified: Secondary | ICD-10-CM | POA: Insufficient documentation

## 2011-05-14 DIAGNOSIS — N949 Unspecified condition associated with female genital organs and menstrual cycle: Secondary | ICD-10-CM | POA: Insufficient documentation

## 2011-05-14 DIAGNOSIS — D251 Intramural leiomyoma of uterus: Secondary | ICD-10-CM | POA: Insufficient documentation

## 2011-05-17 ENCOUNTER — Telehealth: Payer: Self-pay | Admitting: Family Medicine

## 2011-05-17 DIAGNOSIS — N946 Dysmenorrhea, unspecified: Secondary | ICD-10-CM

## 2011-05-17 NOTE — Telephone Encounter (Signed)
Called to notify patient that US showed a few small fibroids and some thickening of uterine wall.  She wants to see Dr. Stefano Gaul for further work up for her symptoms.  I let her know I would check with staff about the status of that referral.

## 2011-05-17 NOTE — Telephone Encounter (Signed)
No order placed. Will route back to MD Shaletta Hinostroza, Maryjo Rochester

## 2011-05-18 NOTE — Telephone Encounter (Signed)
Orders completed and referral letter written.

## 2011-05-18 NOTE — Telephone Encounter (Signed)
Addended by: Ardyth Gal on: 05/18/2011 08:37 AM   Modules accepted: Orders

## 2011-06-07 ENCOUNTER — Telehealth: Payer: Self-pay | Admitting: Family Medicine

## 2011-06-07 NOTE — Telephone Encounter (Signed)
Is asking for results of her ultrasound -

## 2011-06-08 NOTE — Telephone Encounter (Signed)
I had called and left message already- but called and discussed with Tamara Watson that her US showed some thickening of her uterus and some small fibroids.  Told her she needs to have endometrial biopsy.  She used to see Dr. Stefano Gaul and we had made referral for this.  When our office made the referral, they said she needed to contact their office before scheduling an appointment. She has not been able to get in touch with his office yet, but says she has left a message.  I told her this may mean she has a balance at his office.  I advised her to try to get a hold of their office, but if she was unable to get into his office, I told her to let us know as she does need an endometrial biopsy.

## 2012-05-04 ENCOUNTER — Ambulatory Visit: Payer: Self-pay

## 2012-05-04 ENCOUNTER — Other Ambulatory Visit: Payer: Self-pay | Admitting: Occupational Medicine

## 2012-05-04 DIAGNOSIS — R7612 Nonspecific reaction to cell mediated immunity measurement of gamma interferon antigen response without active tuberculosis: Secondary | ICD-10-CM

## 2012-12-08 ENCOUNTER — Encounter: Payer: Self-pay | Admitting: Family Medicine

## 2012-12-11 ENCOUNTER — Encounter: Payer: Self-pay | Admitting: Family Medicine

## 2013-10-19 ENCOUNTER — Ambulatory Visit (INDEPENDENT_AMBULATORY_CARE_PROVIDER_SITE_OTHER): Payer: Managed Care, Other (non HMO) | Admitting: *Deleted

## 2013-10-19 DIAGNOSIS — Z23 Encounter for immunization: Secondary | ICD-10-CM

## 2013-12-13 ENCOUNTER — Ambulatory Visit (INDEPENDENT_AMBULATORY_CARE_PROVIDER_SITE_OTHER): Payer: Managed Care, Other (non HMO) | Admitting: Family Medicine

## 2013-12-13 VITALS — BP 95/79 | HR 98 | Temp 99.2°F | Resp 18 | Wt 204.0 lb

## 2013-12-13 DIAGNOSIS — D509 Iron deficiency anemia, unspecified: Secondary | ICD-10-CM

## 2013-12-13 DIAGNOSIS — J069 Acute upper respiratory infection, unspecified: Secondary | ICD-10-CM

## 2013-12-13 MED ORDER — HYDROCODONE-HOMATROPINE 5-1.5 MG/5ML PO SYRP: 5.0000 mL | ORAL_SOLUTION | Freq: Three times a day (TID) | ORAL | Status: DC | PRN

## 2013-12-13 MED ORDER — BENZONATATE 100 MG PO CAPS: 100.0000 mg | ORAL_CAPSULE | Freq: Three times a day (TID) | ORAL | Status: DC | PRN

## 2013-12-13 MED ORDER — FERROUS SULFATE 325 (65 FE) MG PO TABS: 325.0000 mg | ORAL_TABLET | Freq: Three times a day (TID) | ORAL | Status: DC

## 2013-12-13 NOTE — Assessment & Plan Note (Signed)
Patient brought in blood work from 12/07/2013.  Blood work was negative for ferritin of 5 indicative of iron deficiency anemia. Will start patient on iron therapy.  I advised her to follow-up closely with her OB/GYN for discussion regarding treatment options for menorrhagia (this is the the contributing factor of iron deficiency anemia).

## 2013-12-13 NOTE — Assessment & Plan Note (Signed)
Patient symptoms likely viral in origin. Physical exam unremarkable. No indication for antibiotics at this time. Will treat symptomatically with Hycodan/Tessalon Perles. Advised increasing fluid intake and getting adequate sleep.

## 2013-12-13 NOTE — Patient Instructions (Signed)
Upper Respiratory Infection Treatment - Use the cough syrup and/or tessalon as needed. Increase your fluid intake and get plenty of rest. You should be better in: ~ 1 week.  Call us if you have severe shortness of breath, high fever or are not better in 2 weeks  I have also sent in the iron for you.  Take care  Dr. Lacinda Axon

## 2013-12-13 NOTE — Progress Notes (Signed)
   Subjective:    Patient ID: Tamara Watson, female    DOB: Sep 22, 1970, 43 y.o.   MRN: [id number]  CC: URI symptoms  HPI  URI Has been sick for 4 days. Nasal discharge: Yes; Clear, tan in colr.  Medications tried: Robitussin/claritin. Sick contacts: Yes (patient works in [employer]).   Symptoms Fever: No Headache or face pain: No Tooth pain: No Sneezing: Yes Scratchy throat: Yes Allergies: Yes Muscle aches: No Severe fatigue: Yes; patient with Iron deficiency anemia as well.  Stiff neck: No. Shortness of breath: No Rash: No Sore throat or swollen glands: No  ROS see HPI Patient is a non smoker.  Review of Systems Per HPI    Objective:   Physical Exam Filed Vitals:   12/13/13 1405  BP: 95/79  Pulse: 98  Temp: 99.2 F (37.3 C)  Resp: 18   Exam: General: well appearing female in NAD.  HEENT: NCAT. Normal TM's bilateral. Oropharynx mildly erythematous. No exudates.  Neck: No adenopathy.  Cardiovascular: RRR. No murmurs, rubs, or gallops. Respiratory: CTAB. No rales, rhonchi, or wheeze. Skin: No rash.     Assessment & Plan:  See Problem List

## 2014-02-28 ENCOUNTER — Other Ambulatory Visit: Payer: Self-pay | Admitting: Family Medicine

## 2014-02-28 NOTE — Telephone Encounter (Signed)
Pt is requesting refills on hydrocodone and tessalon pearls. Call when ready.

## 2014-03-01 ENCOUNTER — Ambulatory Visit (INDEPENDENT_AMBULATORY_CARE_PROVIDER_SITE_OTHER): Payer: Managed Care, Other (non HMO) | Admitting: Family Medicine

## 2014-03-01 ENCOUNTER — Encounter: Payer: Self-pay | Admitting: Family Medicine

## 2014-03-01 VITALS — BP 123/69 | HR 84 | Temp 97.9°F | Ht 66.0 in | Wt 199.3 lb

## 2014-03-01 DIAGNOSIS — J309 Allergic rhinitis, unspecified: Secondary | ICD-10-CM

## 2014-03-01 MED ORDER — BENZONATATE 100 MG PO CAPS: 100.0000 mg | ORAL_CAPSULE | Freq: Three times a day (TID) | ORAL | Status: DC | PRN

## 2014-03-01 MED ORDER — FLUTICASONE PROPIONATE 50 MCG/ACT NA SUSP: 2.0000 | Freq: Every day | NASAL | Status: DC

## 2014-03-01 MED ORDER — HYDROCODONE-HOMATROPINE 5-1.5 MG/5ML PO SYRP: 5.0000 mL | ORAL_SOLUTION | Freq: Every evening | ORAL | Status: DC | PRN

## 2014-03-01 NOTE — Patient Instructions (Signed)
Nice to meet you. You likely have allergies that are causing your symptoms. Please take the claritin every day. Start using the flonase daily.  You can use the hycodan at night as needed for cough and the tessalon during the day as needed for cough.  If you develop fever or shortness of breath seek medical attention. If you are not improving with the above regimen please follow-up.   Allergic Rhinitis Allergic rhinitis is when the mucous membranes in the nose respond to allergens. Allergens are particles in the air that cause your body to have an allergic reaction. This causes you to release allergic antibodies. Through a chain of events, these eventually cause you to release histamine into the blood stream. Although meant to protect the body, it is this release of histamine that causes your discomfort, such as frequent sneezing, congestion, and an itchy, runny nose.  CAUSES  Seasonal allergic rhinitis (hay fever) is caused by pollen allergens that may come from grasses, trees, and weeds. Year-round allergic rhinitis (perennial allergic rhinitis) is caused by allergens such as house dust mites, pet dander, and mold spores.  SYMPTOMS   Nasal stuffiness (congestion).  Itchy, runny nose with sneezing and tearing of the eyes. DIAGNOSIS  Your health care provider can help you determine the allergen or allergens that trigger your symptoms. If you and your health care provider are unable to determine the allergen, skin or blood testing may be used. TREATMENT  Allergic rhinitis does not have a cure, but it can be controlled by:  Medicines and allergy shots (immunotherapy).  Avoiding the allergen. Hay fever may often be treated with antihistamines in pill or nasal spray forms. Antihistamines block the effects of histamine. There are over-the-counter medicines that may help with nasal congestion and swelling around the eyes. Check with your health care provider before taking or giving this medicine.   If avoiding the allergen or the medicine prescribed do not work, there are many new medicines your health care provider can prescribe. Stronger medicine may be used if initial measures are ineffective. Desensitizing injections can be used if medicine and avoidance does not work. Desensitization is when a patient is given ongoing shots until the body becomes less sensitive to the allergen. Make sure you follow up with your health care provider if problems continue. HOME CARE INSTRUCTIONS It is not possible to completely avoid allergens, but you can reduce your symptoms by taking steps to limit your exposure to them. It helps to know exactly what you are allergic to so that you can avoid your specific triggers. SEEK MEDICAL CARE IF:   You have a fever.  You develop a cough that does not stop easily (persistent).  You have shortness of breath.  You start wheezing.  Symptoms interfere with normal daily activities. Document Released: 10/13/2000 Document Revised: 01/23/2013 Document Reviewed: 09/25/2012 [facility] Patient Information 2015 [place], [place]. This information is not intended to replace advice given to you by your health care provider. Make sure you discuss any questions you have with your health care provider.

## 2014-03-01 NOTE — Assessment & Plan Note (Signed)
Patient with signs and symptoms of allergic rhinitis with post nasal drip. Well appearing at this time. No signs of bacterial infection. Doubt viral URI at this time given lack of congestion. Will treat with claritin, flonase, tessalon during the day, and hycodan at night. Given return precautions.

## 2014-03-01 NOTE — Progress Notes (Signed)
Patient ID: Tamara Watson, female   DOB: Jun 20, 1970, 44 y.o.   MRN: [id number]  Tommi Rumps, MD Phone: [phone]  Tamara Watson is a 44 y.o. female who presents today for same day appointment.  Patient reports cough, dry, itchy, scratchy throat, post nasal drip, and some congestion over the past week. Initially she had some fatigue, though this is improved. She is also sneezing. She has no fever or night sweats or dyspnea. She notes the hycodan and benadryl help with her symptoms. She has also taken claritin for this intermittently.    ROS: Per HPI   Physical Exam Filed Vitals:   03/01/14 1638  BP: 123/69  Pulse: 84  Temp: 97.9 F (36.6 C)    Gen: Well NAD HEENT: PERRL,  MMM, mild erythema of OP, no exudate, normal TM bilaterally, mild erythema of bilateral nasal passages, no cervical LAD Lungs: CTABL Nl WOB Heart: RRR  Exts: Non edematous BL  LE, warm and well perfused.    Assessment/Plan: Please see individual problem list.  Tommi Rumps, MD [place] PGY-3

## 2014-05-12 ENCOUNTER — Emergency Department (HOSPITAL_COMMUNITY): Payer: Managed Care, Other (non HMO)

## 2014-05-12 ENCOUNTER — Encounter (HOSPITAL_COMMUNITY): Payer: Self-pay | Admitting: *Deleted

## 2014-05-12 ENCOUNTER — Emergency Department (HOSPITAL_COMMUNITY)
Admission: EM | Admit: 2014-05-12 | Discharge: 2014-05-13 | Disposition: A | Discharge status: Home / Self Care | Payer: Managed Care, Other (non HMO) | Attending: Emergency Medicine | Admitting: Emergency Medicine

## 2014-05-12 DIAGNOSIS — K2970 Gastritis, unspecified, without bleeding: Secondary | ICD-10-CM | POA: Insufficient documentation

## 2014-05-12 DIAGNOSIS — Z7951 Long term (current) use of inhaled steroids: Secondary | ICD-10-CM | POA: Insufficient documentation

## 2014-05-12 DIAGNOSIS — R1010 Upper abdominal pain, unspecified: Secondary | ICD-10-CM | POA: Diagnosis present

## 2014-05-12 DIAGNOSIS — Z3202 Encounter for pregnancy test, result negative: Secondary | ICD-10-CM | POA: Diagnosis not present

## 2014-05-12 DIAGNOSIS — Z79899 Other long term (current) drug therapy: Secondary | ICD-10-CM | POA: Diagnosis not present

## 2014-05-12 DIAGNOSIS — Z8659 Personal history of other mental and behavioral disorders: Secondary | ICD-10-CM | POA: Diagnosis not present

## 2014-05-12 LAB — URINALYSIS, ROUTINE W REFLEX MICROSCOPIC
BILIRUBIN URINE: NEGATIVE
Glucose, UA: NEGATIVE mg/dL
Hgb urine dipstick: NEGATIVE
Ketones, ur: NEGATIVE mg/dL
Leukocytes, UA: NEGATIVE
NITRITE: NEGATIVE
PH: 7.5 (ref 5.0–8.0)
Protein, ur: NEGATIVE mg/dL
Specific Gravity, Urine: 1.023 (ref 1.005–1.030)
Urobilinogen, UA: 1 mg/dL (ref 0.0–1.0)

## 2014-05-12 LAB — CBC WITH DIFFERENTIAL/PLATELET
BASOS PCT: 1 % (ref 0–1)
Basophils Absolute: 0 10*3/uL (ref 0.0–0.1)
EOS PCT: 1 % (ref 0–5)
Eosinophils Absolute: 0 10*3/uL (ref 0.0–0.7)
HEMATOCRIT: 37 % (ref 36.0–46.0)
Hemoglobin: 12 g/dL (ref 12.0–15.0)
LYMPHS PCT: 50 % — AB (ref 12–46)
Lymphs Abs: 2.5 10*3/uL (ref 0.7–4.0)
MCH: 26.8 pg (ref 26.0–34.0)
MCHC: 32.4 g/dL (ref 30.0–36.0)
MCV: 82.6 fL (ref 78.0–100.0)
MONOS PCT: 6 % (ref 3–12)
Monocytes Absolute: 0.3 10*3/uL (ref 0.1–1.0)
NEUTROS ABS: 2.1 10*3/uL (ref 1.7–7.7)
NEUTROS PCT: 42 % — AB (ref 43–77)
Platelets: 268 10*3/uL (ref 150–400)
RBC: 4.48 MIL/uL (ref 3.87–5.11)
RDW: 14.5 % (ref 11.5–15.5)
WBC: 5 10*3/uL (ref 4.0–10.5)

## 2014-05-12 LAB — COMPREHENSIVE METABOLIC PANEL
ALT: 12 U/L (ref 0–35)
AST: 15 U/L (ref 0–37)
Albumin: 3.6 g/dL (ref 3.5–5.2)
Alkaline Phosphatase: 44 U/L (ref 39–117)
Anion gap: 4 — ABNORMAL LOW (ref 5–15)
BUN: 11 mg/dL (ref 6–23)
CALCIUM: 9 mg/dL (ref 8.4–10.5)
CO2: 30 mmol/L (ref 19–32)
Chloride: 102 mmol/L (ref 96–112)
Creatinine, Ser: 1 mg/dL (ref 0.50–1.10)
GFR calc Af Amer: 80 mL/min — ABNORMAL LOW (ref >90)
GFR calc non Af Amer: 69 mL/min — ABNORMAL LOW (ref >90)
GLUCOSE: 102 mg/dL — AB (ref 70–99)
Potassium: 3.4 mmol/L — ABNORMAL LOW (ref 3.5–5.1)
SODIUM: 136 mmol/L (ref 135–145)
Total Bilirubin: 0.5 mg/dL (ref 0.3–1.2)
Total Protein: 6.8 g/dL (ref 6.0–8.3)

## 2014-05-12 LAB — POC URINE PREG, ED: Preg Test, Ur: NEGATIVE

## 2014-05-12 LAB — LIPASE, BLOOD: LIPASE: 33 U/L (ref 11–59)

## 2014-05-12 MED ORDER — HYDROMORPHONE HCL 1 MG/ML IJ SOLN
0.5000 mg | Freq: Once | INTRAMUSCULAR | Status: AC
  Administered 2014-05-12: 0.5 mg via INTRAVENOUS
  Filled 2014-05-12: qty 1

## 2014-05-12 MED ORDER — PANTOPRAZOLE SODIUM 40 MG IV SOLR
40.0000 mg | Freq: Once | INTRAVENOUS | Status: AC
  Administered 2014-05-12: 40 mg via INTRAVENOUS
  Filled 2014-05-12: qty 40

## 2014-05-12 NOTE — ED Provider Notes (Signed)
CSN: [id number]     Arrival date & time 05/12/14  1903 History   First MD Initiated Contact with Patient 05/12/14 1928     Chief Complaint  Patient presents with  . Abdominal Pain     (Consider location/radiation/quality/duration/timing/severity/associated sxs/prior Treatment) Patient is a 44 y.o. female presenting with abdominal pain. The history is provided by the patient.  Abdominal Pain Associated symptoms: no chest pain, no chills, no cough, no diarrhea, no dysuria, no fever, no shortness of breath, no sore throat and no vomiting   pt c/o epigastric pain and bil upper quadrant pain, that occasionally radiates to mid back for the past day. Pain is constant. Dull. Mod-severe. Waxes and wanes in intensity. Worse w eating. No nv. Having normal bms incl today. No abd distension. Only prior abd surgery is tubal ligation. No lower abd pain. No vaginal discharge or bleeding. No hematuria or dysuria. No hx kidney stones or gallstones. No fam hx gallstones. No hx pud.  Pt notes hx similar, milder pain in past, that she thought was due to gas, would have relief w ibuprofen and maalox. No chest pain or discomfort. No dyspnea or unusual doe or fatigue. No cough or uri c/o. No fever or chills.       Past Medical History  Diagnosis Date  . Anxiety    Past Surgical History  Procedure Laterality Date  . Tubal ligation    . Breast surgery     Family History  Problem Relation Age of Onset  . Hyperlipidemia Mother   . Diabetes Mother   . Hyperlipidemia Father   . Cancer Maternal Aunt   . Cancer Paternal Grandmother    History  Substance Use Topics  . Smoking status: Never Smoker   . Smokeless tobacco: Not on file  . Alcohol Use: 0.6 oz/week    1 Glasses of wine per week   OB History    No data available     Review of Systems  Constitutional: Negative for fever and chills.  HENT: Negative for sore throat.   Eyes: Negative for redness.  Respiratory: Negative for cough and shortness  of breath.   Cardiovascular: Negative for chest pain and leg swelling.  Gastrointestinal: Positive for abdominal pain. Negative for vomiting, diarrhea and abdominal distention.  Endocrine: Negative for polyuria.  Genitourinary: Negative for dysuria and flank pain.  Musculoskeletal: Negative for back pain and neck pain.  Skin: Negative for rash.  Neurological: Negative for headaches.  Hematological: Does not bruise/bleed easily.  Psychiatric/Behavioral: Negative for confusion.      Allergies  Review of patient's allergies indicates not on file.  Home Medications   Prior to Admission medications   Medication Sig Start Date End Date Taking? Authorizing Provider  benzonatate (TESSALON) 100 MG capsule Take 1 capsule (100 mg total) by mouth 3 (three) times daily as needed for cough. 03/01/14   Leone Haven, MD  ferrous sulfate 325 (65 FE) MG tablet Take 1 tablet (325 mg total) by mouth 3 (three) times daily. 12/13/13   Barnie Del Cook, DO  fluticasone (FLONASE) 50 MCG/ACT nasal spray Place 2 sprays into both nostrils daily. 03/01/14   Leone Haven, MD  HYDROcodone-homatropine [facility]) 5-1.5 MG/5ML syrup Take 5 mLs by mouth at bedtime as needed for cough. 03/01/14   Leone Haven, MD  phentermine 15 MG capsule Take 15 mg by mouth every morning.    Historical Provider, MD   BP 123/93 mmHg  Pulse 108  Temp(Src) 99 F (  37.2 C)  Resp 18  SpO2 96%  LMP 04/28/2014 Physical Exam  Constitutional: She appears well-developed and well-nourished. No distress.  HENT:  Mouth/Throat: Oropharynx is clear and moist.  Eyes: Conjunctivae are normal. No scleral icterus.  Neck: Neck supple. No tracheal deviation present.  Cardiovascular: Normal rate, regular rhythm, normal heart sounds and intact distal pulses.  Exam reveals no gallop and no friction rub.   No murmur heard. Pulmonary/Chest: Effort normal and breath sounds normal. No respiratory distress.  Abdominal: Soft. Normal appearance  and bowel sounds are normal. She exhibits no distension and no mass. There is no rebound and no guarding.  Mild upper abd tenderness.   Genitourinary:  No cva tenderness  Musculoskeletal: She exhibits no edema.  Neurological: She is alert.  Skin: Skin is warm and dry. No rash noted. She is not diaphoretic.  Psychiatric: She has a normal mood and affect.  Nursing note and vitals reviewed.   ED Course  Procedures (including critical care time) Labs Review  Results for orders placed or performed during the hospital encounter of 05/12/14  CBC with Differential  Result Value Ref Range   WBC 5.0 4.0 - 10.5 K/uL   RBC 4.48 3.87 - 5.11 MIL/uL   Hemoglobin 12.0 12.0 - 15.0 g/dL   HCT 37.0 36.0 - 46.0 %   MCV 82.6 78.0 - 100.0 fL   MCH 26.8 26.0 - 34.0 pg   MCHC 32.4 30.0 - 36.0 g/dL   RDW 14.5 11.5 - 15.5 %   Platelets 268 150 - 400 K/uL   Neutrophils Relative % 42 (L) 43 - 77 %   Neutro Abs 2.1 1.7 - 7.7 K/uL   Lymphocytes Relative 50 (H) 12 - 46 %   Lymphs Abs 2.5 0.7 - 4.0 K/uL   Monocytes Relative 6 3 - 12 %   Monocytes Absolute 0.3 0.1 - 1.0 K/uL   Eosinophils Relative 1 0 - 5 %   Eosinophils Absolute 0.0 0.0 - 0.7 K/uL   Basophils Relative 1 0 - 1 %   Basophils Absolute 0.0 0.0 - 0.1 K/uL  Comprehensive metabolic panel  Result Value Ref Range   Sodium 136 135 - 145 mmol/L   Potassium 3.4 (L) 3.5 - 5.1 mmol/L   Chloride 102 96 - 112 mmol/L   CO2 30 19 - 32 mmol/L   Glucose, Bld 102 (H) 70 - 99 mg/dL   BUN 11 6 - 23 mg/dL   Creatinine, Ser 1.00 0.50 - 1.10 mg/dL   Calcium 9.0 8.4 - 10.5 mg/dL   Total Protein 6.8 6.0 - 8.3 g/dL   Albumin 3.6 3.5 - 5.2 g/dL   AST 15 0 - 37 U/L   ALT 12 0 - 35 U/L   Alkaline Phosphatase 44 39 - 117 U/L   Total Bilirubin 0.5 0.3 - 1.2 mg/dL   GFR calc non Af Amer 69 (L) >90 mL/min   GFR calc Af Amer 80 (L) >90 mL/min   Anion gap 4 (L) 5 - 15  Lipase, blood  Result Value Ref Range   Lipase 33 11 - 59 U/L  Urinalysis, Routine w reflex  microscopic  Result Value Ref Range   Color, Urine YELLOW YELLOW   APPearance CLEAR CLEAR   Specific Gravity, Urine 1.023 1.005 - 1.030   pH 7.5 5.0 - 8.0   Glucose, UA NEGATIVE NEGATIVE mg/dL   Hgb urine dipstick NEGATIVE NEGATIVE   Bilirubin Urine NEGATIVE NEGATIVE   Ketones, ur NEGATIVE NEGATIVE mg/dL  Protein, ur NEGATIVE NEGATIVE mg/dL   Urobilinogen, UA 1.0 0.0 - 1.0 mg/dL   Nitrite NEGATIVE NEGATIVE   Leukocytes, UA NEGATIVE NEGATIVE  POC Urine Pregnancy, ED  (If Pre-menopausal female) - do not order at [facility]  Result Value Ref Range   Preg Test, Ur NEGATIVE NEGATIVE   US Abdomen Complete  05/13/2014   CLINICAL DATA:  Upper abdominal and epigastric pain for 2 days.  EXAM: ULTRASOUND ABDOMEN COMPLETE  COMPARISON:  None.  FINDINGS: Gallbladder: No gallstones or wall thickening visualized. No sonographic Murphy sign noted.  Common bile duct: Diameter: 5-6 mm, normal.  Liver: No focal lesion identified. Within normal limits in parenchymal echogenicity. Normal directional flow in the main portal vein.  IVC: No abnormality visualized.  Pancreas: Visualized portion unremarkable, distal body and tail obscured.  Spleen: Size and appearance within normal limits.  Right Kidney: Length: 11.4 cm. Echogenicity within normal limits. No mass or hydronephrosis visualized.  Left Kidney: Length: 10.3 cm. Echogenicity within normal limits. No mass or hydronephrosis visualized.  Abdominal aorta: No aneurysm visualized.  Other findings: None.  No ascites.  IMPRESSION: Normal abdominal ultrasound.   Electronically Signed   By: Jeb Levering M.D.   On: 05/13/2014 01:03      MDM   Iv ns. Dilaudid .5 mg iv.  protonix iv.  Labs. Ultrasound.  Reviewed nursing notes and prior charts for additional history.   Recheck pain improved.   U/s neg acute.  abd soft nt.  No abd pain. No cp or sob.   Afeb.  Pt currently appears stable for d/c.   rec close pcp follow up. Will rec acid blocker, maalox  prn.   Return precautions provided.     Lajean Saver, MD 05/13/14 [phone]

## 2014-05-12 NOTE — ED Notes (Signed)
The pt is c/o epigastric pain since last pm no n v or diarrhea.  lmp  2 weeks ago.  The pain was last night went away then came back tonight stronger.  She has a history of constipation

## 2014-05-13 MED ORDER — HYDROCODONE-ACETAMINOPHEN 5-325 MG PO TABS: 1.0000 | ORAL_TABLET | Freq: Four times a day (QID) | ORAL | Status: DC | PRN

## 2014-05-13 MED ORDER — PANTOPRAZOLE SODIUM 40 MG PO TBEC: 40.0000 mg | DELAYED_RELEASE_TABLET | Freq: Every day | ORAL | Status: DC

## 2014-05-13 NOTE — Discharge Instructions (Signed)
It was our pleasure to provide your ER care today - we hope that you feel better.  Rest. Drink plenty of fluids.  Take protonix (acid blocker medication).  You may also try maalox, mylanta, or gas-x as need for symptom relief.   You may take hydrocodone as need for pain. No driving when taking hydrocodone. Also, do not take tylenol or acetaminophen containing medication when taking hydrocodone.  Follow up with primary care doctor/gi doctor in coming week - see referral -  Call office to arrange appointment.   Return to ER if worse, new symptoms, fevers, worsening or severe abdominal pain, persistent vomiting, chest pain, trouble breathing, other concern.  You were given pain medication in the ER - no driving for the next 4 hours.     Abdominal Pain Many things can cause abdominal pain. Usually, abdominal pain is not caused by a disease and will improve without treatment. It can often be observed and treated at home. Your health care provider will do a physical exam and possibly order blood tests and X-rays to help determine the seriousness of your pain. However, in many cases, more time must pass before a clear cause of the pain can be found. Before that point, your health care provider may not know if you need more testing or further treatment. HOME CARE INSTRUCTIONS  Monitor your abdominal pain for any changes. The following actions may help to alleviate any discomfort you are experiencing:  Only take over-the-counter or prescription medicines as directed by your health care provider.  Do not take laxatives unless directed to do so by your health care provider.  Try a clear liquid diet (broth, tea, or water) as directed by your health care provider. Slowly move to a bland diet as tolerated. SEEK MEDICAL CARE IF:  You have unexplained abdominal pain.  You have abdominal pain associated with nausea or diarrhea.  You have pain when you urinate or have a bowel movement.  You  experience abdominal pain that wakes you in the night.  You have abdominal pain that is worsened or improved by eating food.  You have abdominal pain that is worsened with eating fatty foods.  You have a fever. SEEK IMMEDIATE MEDICAL CARE IF:   Your pain does not go away within 2 hours.  You keep throwing up (vomiting).  Your pain is felt only in portions of the abdomen, such as the right side or the left lower portion of the abdomen.  You pass bloody or black tarry stools. MAKE SURE YOU:  Understand these instructions.   Will watch your condition.   Will get help right away if you are not doing well or get worse.  Document Released: 10/28/2004 Document Revised: 01/23/2013 Document Reviewed: 09/27/2012 [facility] Patient Information 2015 [place], [place]. This information is not intended to replace advice given to you by your health care provider. Make sure you discuss any questions you have with your health care provider.    Gastritis, Adult Gastritis is soreness and swelling (inflammation) of the lining of the stomach. Gastritis can develop as a sudden onset (acute) or long-term (chronic) condition. If gastritis is not treated, it can lead to stomach bleeding and ulcers. CAUSES  Gastritis occurs when the stomach lining is weak or damaged. Digestive juices from the stomach then inflame the weakened stomach lining. The stomach lining may be weak or damaged due to viral or bacterial infections. One common bacterial infection is the Helicobacter pylori infection. Gastritis can also result from excessive alcohol  consumption, taking certain medicines, or having too much acid in the stomach.  SYMPTOMS  In some cases, there are no symptoms. When symptoms are present, they may include:  Pain or a burning sensation in the upper abdomen.  Nausea.  Vomiting.  An uncomfortable feeling of fullness after eating. DIAGNOSIS  Your caregiver may suspect you have gastritis based on your  symptoms and a physical exam. To determine the cause of your gastritis, your caregiver may perform the following:  Blood or stool tests to check for the H pylori bacterium.  Gastroscopy. A thin, flexible tube (endoscope) is passed down the esophagus and into the stomach. The endoscope has a light and camera on the end. Your caregiver uses the endoscope to view the inside of the stomach.  Taking a tissue sample (biopsy) from the stomach to examine under a microscope. TREATMENT  Depending on the cause of your gastritis, medicines may be prescribed. If you have a bacterial infection, such as an H pylori infection, antibiotics may be given. If your gastritis is caused by too much acid in the stomach, H2 blockers or antacids may be given. Your caregiver may recommend that you stop taking aspirin, ibuprofen, or other nonsteroidal anti-inflammatory drugs (NSAIDs). HOME CARE INSTRUCTIONS  Only take over-the-counter or prescription medicines as directed by your caregiver.  If you were given antibiotic medicines, take them as directed. Finish them even if you start to feel better.  Drink enough fluids to keep your urine clear or pale yellow.  Avoid foods and drinks that make your symptoms worse, such as:  Caffeine or alcoholic drinks.  Chocolate.  Peppermint or mint flavorings.  Garlic and onions.  Spicy foods.  Citrus fruits, such as oranges, lemons, or limes.  Tomato-based foods such as sauce, chili, salsa, and pizza.  Fried and fatty foods.  Eat small, frequent meals instead of large meals. SEEK IMMEDIATE MEDICAL CARE IF:   You have black or dark red stools.  You vomit blood or material that looks like coffee grounds.  You are unable to keep fluids down.  Your abdominal pain gets worse.  You have a fever.  You do not feel better after 1 week.  You have any other questions or concerns. MAKE SURE YOU:  Understand these instructions.  Will watch your condition.  Will  get help right away if you are not doing well or get worse. Document Released: 01/12/2001 Document Revised: 07/20/2011 Document Reviewed: 03/03/2011 [facility] Patient Information 2015 [place], [place]. This information is not intended to replace advice given to you by your health care provider. Make sure you discuss any questions you have with your health care provider.

## 2016-03-01 ENCOUNTER — Ambulatory Visit (INDEPENDENT_AMBULATORY_CARE_PROVIDER_SITE_OTHER): Payer: Managed Care, Other (non HMO) | Admitting: Internal Medicine

## 2016-03-01 VITALS — BP 120/68 | HR 91 | Temp 98.5°F | Ht 66.0 in | Wt 206.6 lb

## 2016-03-01 DIAGNOSIS — K219 Gastro-esophageal reflux disease without esophagitis: Secondary | ICD-10-CM

## 2016-03-01 DIAGNOSIS — R1013 Epigastric pain: Secondary | ICD-10-CM

## 2016-03-01 DIAGNOSIS — Z862 Personal history of diseases of the blood and blood-forming organs and certain disorders involving the immune mechanism: Secondary | ICD-10-CM

## 2016-03-01 DIAGNOSIS — R05 Cough: Secondary | ICD-10-CM

## 2016-03-01 DIAGNOSIS — D649 Anemia, unspecified: Secondary | ICD-10-CM | POA: Diagnosis not present

## 2016-03-01 DIAGNOSIS — G8929 Other chronic pain: Secondary | ICD-10-CM

## 2016-03-01 DIAGNOSIS — R059 Cough, unspecified: Secondary | ICD-10-CM

## 2016-03-01 DIAGNOSIS — E663 Overweight: Secondary | ICD-10-CM

## 2016-03-01 LAB — COMPLETE METABOLIC PANEL WITH GFR
ALBUMIN: 3.7 g/dL (ref 3.6–5.1)
ALK PHOS: 41 U/L (ref 33–115)
ALT: 10 U/L (ref 6–29)
AST: 12 U/L (ref 10–30)
BILIRUBIN TOTAL: 0.3 mg/dL (ref 0.2–1.2)
BUN: 10 mg/dL (ref 7–25)
CO2: 25 mmol/L (ref 20–31)
CREATININE: 0.9 mg/dL (ref 0.50–1.10)
Calcium: 8.7 mg/dL (ref 8.6–10.2)
Chloride: 107 mmol/L (ref 98–110)
GFR, Est African American: >89 mL/min (ref >=60)
GFR, Est Non African American: 79 mL/min (ref >=60)
GLUCOSE: 98 mg/dL (ref 65–99)
Potassium: 4 mmol/L (ref 3.5–5.3)
Sodium: 139 mmol/L (ref 135–146)
TOTAL PROTEIN: 6.9 g/dL (ref 6.1–8.1)

## 2016-03-01 LAB — CBC
HCT: 30.3 % — ABNORMAL LOW (ref 35.0–45.0)
HEMOGLOBIN: 9 g/dL — AB (ref 11.7–15.5)
MCH: 19.9 pg — ABNORMAL LOW (ref 27.0–33.0)
MCHC: 29.7 g/dL — ABNORMAL LOW (ref 32.0–36.0)
MCV: 67 fL — ABNORMAL LOW (ref 80.0–100.0)
MPV: 9.9 fL (ref 7.5–12.5)
Platelets: 342 10*3/uL (ref 140–400)
RBC: 4.52 MIL/uL (ref 3.80–5.10)
RDW: 18 % — ABNORMAL HIGH (ref 11.0–15.0)
WBC: 3.8 10*3/uL (ref 3.8–10.8)

## 2016-03-01 LAB — LIPID PANEL
Cholesterol: 151 mg/dL (ref <200)
HDL: 43 mg/dL — ABNORMAL LOW (ref >50)
LDL CALC: 97 mg/dL (ref <100)
Total CHOL/HDL Ratio: 3.5 Ratio (ref <5.0)
Triglycerides: 57 mg/dL (ref <150)
VLDL: 11 mg/dL (ref <30)

## 2016-03-01 LAB — POCT H PYLORI SCREEN: H PYLORI SCREEN, POC: NEGATIVE

## 2016-03-01 MED ORDER — ESOMEPRAZOLE MAGNESIUM 40 MG PO CPDR: 40.0000 mg | DELAYED_RELEASE_CAPSULE | Freq: Every day | ORAL | Status: DC

## 2016-03-01 MED ORDER — BENZONATATE 100 MG PO CAPS: 100.0000 mg | ORAL_CAPSULE | Freq: Three times a day (TID) | ORAL | 0 refills | Status: DC | PRN

## 2016-03-01 NOTE — Patient Instructions (Signed)
Tamara Watson,  I would recommend trying double the dose of nexium if you are only taking 20 mg daily (increase to 40 mg). If this does not help, please let our clinic know, and I will refer you to Gastroenterology.  Please make an appointment for pap smear at your earliest convenience.  I will call  You with your lab results.  Best, Dr. Ola Spurr   Food Choices for Gastroesophageal Reflux Disease, Adult When you have gastroesophageal reflux disease (GERD), the foods you eat and your eating habits are very important. Choosing the right foods can help ease your discomfort. What guidelines do I need to follow?  Choose fruits, vegetables, whole grains, and low-fat dairy products.  Choose low-fat meat, fish, and poultry.  Limit fats such as oils, salad dressings, butter, nuts, and avocado.  Keep a food diary. This helps you identify foods that cause symptoms.  Avoid foods that cause symptoms. These may be different for everyone.  Eat small meals often instead of 3 large meals a day.  Eat your meals slowly, in a place where you are relaxed.  Limit fried foods.  Cook foods using methods other than frying.  Avoid drinking alcohol.  Avoid drinking large amounts of liquids with your meals.  Avoid bending over or lying down until 2-3 hours after eating. What foods are not recommended? These are some foods and drinks that may make your symptoms worse: Vegetables  Tomatoes. Tomato juice. Tomato and spaghetti sauce. Chili peppers. Onion and garlic. Horseradish. Fruits  Oranges, grapefruit, and lemon (fruit and juice). Meats  High-fat meats, fish, and poultry. This includes hot dogs, ribs, ham, sausage, salami, and bacon. Dairy  Whole milk and chocolate milk. Sour cream. Cream. Butter. Ice cream. Cream cheese. Drinks  Coffee and tea. Bubbly (carbonated) drinks or energy drinks. Condiments  Hot sauce. Barbecue sauce. Sweets/Desserts  Chocolate and cocoa. Donuts. Peppermint  and spearmint. Fats and Oils  High-fat foods. This includes [place] fries and potato chips. Other  Vinegar. Strong spices. This includes black pepper, white pepper, red pepper, cayenne, curry powder, cloves, ginger, and chili powder. The items listed above may not be a complete list of foods and drinks to avoid. Contact your dietitian for more information.  This information is not intended to replace advice given to you by your health care provider. Make sure you discuss any questions you have with your health care provider. Document Released: 07/20/2011 Document Revised: 06/26/2015 Document Reviewed: 11/22/2012 Elsevier Interactive Patient Education  2017 [employer].

## 2016-03-01 NOTE — Progress Notes (Signed)
[name] Progress Note  Subjective:  Tamara Watson is a 46 y.o. female who presents for check-up and concern for abdominal pain.  Abdominal pain: - Occurs most days. Ongoing for about 1 year. Mostly located in epigastric area. Is worsened by certain foods like cabbage and beans. Not worsened by position.  - Improved some by nexium and gas-x. Tried protonix for about 2 weeks after being seen for this in the ED 05/2014 but thinks nexium works better. Also thinks drinking sprite helps some.  - No stones or thickening of gallbladder wall on U/S last April and normal lipase and LFTs at that time - No decreased appetite - Has BMs about every day to every other day. Drinks green tea and takes senna if she feels she is constipated.  - Did have emesis for about 1.5 days 2 weeks ago that resolved on its own. Vomitus was clear with some food.  ROS: No nausea, no diarrhea, no dark stools, complains of "tickle" in her throat  Hx of anemia: - Used to take OTC liquid iron supplementation for iron deficiency anemia but stopped a few months ago - Still with heavy, painful periods but says are tolerable. Says she was seen by Gynecology and told she had fibroids. Read of pelvic U/S from 2013 showed two small intramural fibroids and suspected adenomyosis.  ROS: increased fatigue  Health Maintenance: Due for pap smear, HIV screening. 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   Social: Does not exercise regularly. Never smoker. Drinks EtOH about 2-4 times a month, and has only 1-2 drinks at a time. Denies > 6 drinks at a time. Never has used drugs. Feels safe in her relationship.    Past Medical History:  Diagnosis Date  . Anxiety     No Known Allergies  Objective: Blood pressure 120/68, pulse 91, temperature 98.5 F (36.9 C), temperature source Oral, height 5\' 6"  (1.676 m), weight 206 lb 9.6 oz (93.7 kg), last menstrual period 02/16/2016, SpO2 100 %. Body mass  index is 33.35 kg/m. Constitutional: Obese female in NAD.  HENT: Mildly swollen nasal turbinates, normal posterior oropharynx. No goiter noted.  Cardiovascular: RRR, S1, S2, no m/r/g.  Pulmonary/Chest: Effort normal and breath sounds normal. No respiratory distress.  Abdominal: Soft. +BS, mild epigastric TTP, ND, no rebound or guarding.  Musculoskeletal: No LE edema Neurological: AOx3, no focal deficits. Skin: Skin is warm and dry. No rash noted. No erythema.  Psychiatric: Normal mood and affect.  Vitals reviewed  Assessment/Plan: Abdominal pain, chronic, epigastric - Suspect GERD - Recommended trialing increased dose of nexium (40 mg) - Will obtain CMP to check for normal liver function - Will obtain POC H. pylori testing - If no improvement with increased nexium, would refer to GI - Provided handout about food choices with GERD  History of anemia - Will obtain CBC, as patient feeling increased fatigue - Pt wishes to defer hysterectomy until symptoms become worse  Health maintenance: Pt wished to defer pap smear. Declined HIV screening. Obtained lipid panel.   Follow-up at earliest convenience for pap smear.   [name], MD [place], PGY-2

## 2016-03-03 DIAGNOSIS — G8929 Other chronic pain: Secondary | ICD-10-CM | POA: Insufficient documentation

## 2016-03-03 DIAGNOSIS — Z862 Personal history of diseases of the blood and blood-forming organs and certain disorders involving the immune mechanism: Secondary | ICD-10-CM | POA: Insufficient documentation

## 2016-03-03 DIAGNOSIS — R1013 Epigastric pain: Secondary | ICD-10-CM

## 2016-03-03 NOTE — Assessment & Plan Note (Signed)
-   Will obtain CBC, as patient feeling increased fatigue - Pt wishes to defer hysterectomy until symptoms become worse

## 2016-03-03 NOTE — Assessment & Plan Note (Addendum)
-   Suspect GERD - Recommended trialing increased dose of nexium (40 mg) - Will obtain CMP to check for normal liver function - Will obtain POC H. pylori testing - If no improvement with increased nexium, would refer to GI - Provided handout about food choices with GERD

## 2016-03-04 ENCOUNTER — Encounter: Payer: Self-pay | Admitting: Internal Medicine

## 2016-07-02 ENCOUNTER — Telehealth: Payer: Self-pay | Admitting: Internal Medicine

## 2016-07-02 ENCOUNTER — Encounter: Payer: Self-pay | Admitting: Nurse Practitioner

## 2016-07-02 DIAGNOSIS — G8929 Other chronic pain: Secondary | ICD-10-CM

## 2016-07-02 DIAGNOSIS — R1013 Epigastric pain: Principal | ICD-10-CM

## 2016-07-02 NOTE — Telephone Encounter (Signed)
Pt is calling because she continues to have the lower stomach pain. It is not getting better. She was told to call back if this doesn't improve and we would refer her to a GI doctor. Please let patient know when this is done. jw

## 2016-07-02 NOTE — Telephone Encounter (Signed)
Called patient, no answer, no voicemail

## 2016-07-02 NOTE — Telephone Encounter (Signed)
Placed GI referral. Please let patient know she should expect a call to make an appointment in about a week.

## 2016-07-21 ENCOUNTER — Ambulatory Visit (INDEPENDENT_AMBULATORY_CARE_PROVIDER_SITE_OTHER): Payer: Managed Care, Other (non HMO) | Admitting: Nurse Practitioner

## 2016-07-21 ENCOUNTER — Encounter: Payer: Self-pay | Admitting: Nurse Practitioner

## 2016-07-21 ENCOUNTER — Encounter: Payer: Self-pay | Admitting: Gastroenterology

## 2016-07-21 ENCOUNTER — Other Ambulatory Visit (INDEPENDENT_AMBULATORY_CARE_PROVIDER_SITE_OTHER): Payer: Managed Care, Other (non HMO)

## 2016-07-21 ENCOUNTER — Encounter (INDEPENDENT_AMBULATORY_CARE_PROVIDER_SITE_OTHER): Payer: Self-pay

## 2016-07-21 VITALS — BP 118/72 | Ht 66.0 in | Wt 213.0 lb

## 2016-07-21 DIAGNOSIS — R140 Abdominal distension (gaseous): Secondary | ICD-10-CM

## 2016-07-21 DIAGNOSIS — D509 Iron deficiency anemia, unspecified: Secondary | ICD-10-CM | POA: Diagnosis not present

## 2016-07-21 DIAGNOSIS — K5900 Constipation, unspecified: Secondary | ICD-10-CM | POA: Diagnosis not present

## 2016-07-21 DIAGNOSIS — R1013 Epigastric pain: Secondary | ICD-10-CM

## 2016-07-21 LAB — CBC WITH DIFFERENTIAL/PLATELET
BASOS ABS: 0.1 10*3/uL (ref 0.0–0.1)
Basophils Relative: 1.4 % (ref 0.0–3.0)
Eosinophils Absolute: 0 10*3/uL (ref 0.0–0.7)
Eosinophils Relative: 0.7 % (ref 0.0–5.0)
HEMATOCRIT: 31.5 % — AB (ref 36.0–46.0)
Hemoglobin: 9.6 g/dL — ABNORMAL LOW (ref 12.0–15.0)
LYMPHS PCT: 41.9 % (ref 12.0–46.0)
Lymphs Abs: 2.4 10*3/uL (ref 0.7–4.0)
MCHC: 30.5 g/dL (ref 30.0–36.0)
MONOS PCT: 9.2 % (ref 3.0–12.0)
Monocytes Absolute: 0.5 10*3/uL (ref 0.1–1.0)
NEUTROS ABS: 2.6 10*3/uL (ref 1.4–7.7)
Neutrophils Relative %: 46.8 % (ref 43.0–77.0)
Platelets: 290 10*3/uL (ref 150.0–400.0)
RBC: 4.86 Mil/uL (ref 3.87–5.11)
RDW: 18.8 % — ABNORMAL HIGH (ref 11.5–15.5)
WBC: 5.6 10*3/uL (ref 4.0–10.5)

## 2016-07-21 LAB — COMPREHENSIVE METABOLIC PANEL
ALBUMIN: 4 g/dL (ref 3.5–5.2)
ALK PHOS: 46 U/L (ref 39–117)
ALT: 7 U/L (ref 0–35)
AST: 12 U/L (ref 0–37)
BILIRUBIN TOTAL: 0.4 mg/dL (ref 0.2–1.2)
BUN: 7 mg/dL (ref 6–23)
CO2: 28 meq/L (ref 19–32)
CREATININE: 0.76 mg/dL (ref 0.40–1.20)
Calcium: 9.3 mg/dL (ref 8.4–10.5)
Chloride: 103 mEq/L (ref 96–112)
GFR: 106.58 mL/min (ref >60.00)
Glucose, Bld: 94 mg/dL (ref 70–99)
POTASSIUM: 3.5 meq/L (ref 3.5–5.1)
SODIUM: 138 meq/L (ref 135–145)
Total Protein: 7.1 g/dL (ref 6.0–8.3)

## 2016-07-21 LAB — VITAMIN B12: Vitamin B-12: 477 pg/mL (ref 211–911)

## 2016-07-21 LAB — FERRITIN: Ferritin: 3.8 ng/mL — ABNORMAL LOW (ref 10.0–291.0)

## 2016-07-21 LAB — IBC PANEL
Iron: 12 ug/dL — ABNORMAL LOW (ref 42–145)
Saturation Ratios: 2.1 % — ABNORMAL LOW (ref 20.0–50.0)
TRANSFERRIN: 403 mg/dL — AB (ref 212.0–360.0)

## 2016-07-21 LAB — FOLATE: Folate: 8.9 ng/mL (ref >5.9)

## 2016-07-21 LAB — LIPASE: Lipase: 33 U/L (ref 11.0–59.0)

## 2016-07-21 MED ORDER — POLYETHYLENE GLYCOL 3350 17 GM/SCOOP PO POWD: 1.0000 | Freq: Every day | ORAL | 3 refills | Status: DC

## 2016-07-21 NOTE — Patient Instructions (Signed)
If you are age 45 or older, your body mass index should be between 23-30. Your Body mass index is 34.38 kg/m. If this is out of the aforementioned range listed, please consider follow up with your Primary Care Provider.  If you are age 28 or younger, your body mass index should be between 19-25. Your Body mass index is 34.38 kg/m. If this is out of the aformentioned range listed, please consider follow up with your Primary Care Provider.   You have been scheduled for an endoscopy. Please follow written instructions given to you at your visit today. If you use inhalers (even only as needed), please bring them with you on the day of your procedure. Your physician has requested that you go to www.startemmi.com and enter the access code given to you at your visit today. This web site gives a general overview about your procedure. However, you should still follow specific instructions given to you by our office regarding your preparation for the procedure.  We have sent the following medications to your pharmacy for you to pick up at your convenience: [facility] has requested that you go to the basement for lab work before leaving today.  Try to avoid Advil and ALL NSAIDS until endoscopy.  Thank you for choosing me and [facility].   Tye Savoy, NP

## 2016-07-21 NOTE — Progress Notes (Signed)
HPI:  Patient is a 46 year old female, referred by PCP, Dr. Ola Spurr for evaluation of abdominal pain and vomiting. Her symptoms started several months ago, were initially infrequent (maybe once a month). Episodes start with a sensation of abdominal fullness this is quickly followed by belching, distention, and epigastric pain. Pain radiates through to her back, sometimes around both sides of upper abdomen, other times into right scapula. Symptoms eventually culminate with 2-3 episodes of projectile vomiting then she feels better.  These episodes are not related to eating and are happening several times a month now. Started on Protonix which did not help.  Changed to Nexium which works better. She recently doubled her Nexium dose and symptoms seem to be a little less severe. No hematemesis, no black stools. Patient takes ibuprofen 2-3 days out of the month for menstrual cramps. She takes stool softeners on a regular basis but still does not feel adequately evacuated. No rectal bleeding. Her weight is stable. Of note, she had an unremarkable abdominal u/s April 2016, done for upper abdominal pain   Most recent labs done in late January of this year: H. pylori screen negative . Hemoglobin 9, down from baseline of 12 ( 2016). MCV 67. CMET unremarkable.  Past Medical History:  Diagnosis Date  . Anxiety     Past Surgical History:  Procedure Laterality Date  . BREAST SURGERY    . TUBAL LIGATION     Family History  Problem Relation Age of Onset  . Cancer Maternal Aunt   . Ovarian cancer Maternal Aunt   . Cancer Paternal Grandmother   . Pancreatic cancer Paternal Grandmother   . Hyperlipidemia Mother   . Diabetes Mother   . Hyperlipidemia Father   . Breast cancer Maternal Aunt    Social History  Substance Use Topics  . Smoking status: Never Smoker  . Smokeless tobacco: Never Used  . Alcohol use 0.6 oz/week    1 Glasses of wine per week   Current Outpatient Prescriptions    Medication Sig Dispense Refill  . esomeprazole (NEXIUM) 40 MG capsule Take 1 capsule (40 mg total) by mouth daily at 12 noon.    . fluticasone (FLONASE) 50 MCG/ACT nasal spray Place 2 sprays into both nostrils daily. 16 g 1  . ibuprofen (ADVIL,MOTRIN) 200 MG tablet Take 600 mg by mouth every 6 (six) hours as needed for mild pain.    . benzonatate (TESSALON) 100 MG capsule Take 1 capsule (100 mg total) by mouth 3 (three) times daily as needed for cough. (Patient not taking: Reported on 07/21/2016) 30 capsule 0   No current facility-administered medications for this visit.    No Known Allergies   Review of Systems: Positive for anxiety, vision changes, and menstrual pain. All other systems reviewed and negative except where noted in HPI.    Physical Exam: BP 118/72   Ht 5\' 6"  (1.676 m)   Wt 213 lb (96.6 kg)   BMI 34.38 kg/m  Constitutional:  Well-developed, black female in no acute distress. Psychiatric: Normal mood and affect. Behavior is normal. EENT: Pupils normal.  Conjunctivae are normal. No scleral icterus. Neck supple.  Cardiovascular: Normal rate, regular rhythm. No edema Pulmonary/chest: Effort normal and breath sounds normal. No wheezing, rales or rhonchi. Abdominal: Soft, nondistended. Mild epigastric tenderness. Bowel sounds active throughout. There are no masses palpable. No hepatomegaly. Lymphadenopathy: No cervical adenopathy noted. Neurological: Alert and oriented to person place and time. Skin: Skin is warm and dry. No rashes  noted.   ASSESSMENT AND PLAN:  22. 46 year old female with several month history (possibly longer) of episodic epigastric pain and distention relieved with vomiting. Feels okay in between episodes. No excessive NSAID use.  Unremarkable abdominal u/s in 2016. Review of labs show that she had microcytic anemia in January 2019 (see #2) She does have heavy bleeding with cycles but only 2.5 days of month.  -For further evaluation of progressive  epigastric pain, nausea and vomiting patient will be scheduled for EGD. The risks and benefits of EGD were discussed and the patient agrees to proceed.  -continue Nexium -avoid NSAIDS until PUD excluded by EGD -cmet, lipase, cbc  2. Microcytic anemia with 3 g drop in hemoglobin between April 2016 and January of this year.  -Last labs were done in January. Will repeat CBC today.  -ferritin, tibc, b12  3. Chronic constipation. She takes stool softeners but never feels adequately evacuated  -Trial of MiraLAX 1 capful daily   Tye Savoy, NP  07/21/2016, 1:41 PM  Cc:  Larey Seat*

## 2016-07-23 NOTE — Progress Notes (Signed)
Reviewed and agree with documentation and assessment and plan. K. Veena Jahara Dail , MD   

## 2016-07-28 ENCOUNTER — Ambulatory Visit (AMBULATORY_SURGERY_CENTER): Payer: Managed Care, Other (non HMO) | Admitting: Gastroenterology

## 2016-07-28 ENCOUNTER — Encounter: Payer: Self-pay | Admitting: Gastroenterology

## 2016-07-28 VITALS — BP 113/74 | HR 85 | Temp 99.3°F | Resp 16 | Ht 66.0 in | Wt 213.0 lb

## 2016-07-28 DIAGNOSIS — R1013 Epigastric pain: Secondary | ICD-10-CM | POA: Diagnosis not present

## 2016-07-28 DIAGNOSIS — R140 Abdominal distension (gaseous): Secondary | ICD-10-CM

## 2016-07-28 LAB — HELICOBACTER PYLORI SCREEN-BIOPSY: UREASE: POSITIVE — AB

## 2016-07-28 MED ORDER — SODIUM CHLORIDE 0.9 % IV SOLN: 500.0000 mL | INTRAVENOUS | Status: DC

## 2016-07-28 NOTE — Progress Notes (Signed)
A and O x3. Report to RN. Tolerated MAC anesthesia well.Teeth unchanged after procedure. 

## 2016-07-28 NOTE — Op Note (Addendum)
[facility] Patient Name: Tamara Watson Procedure Date: 07/28/2016 3:17 PM MRN: [id number] Endoscopist: Mauri Pole , MD Age: 46 Referring MD:  Date of Birth: 25-Sep-1970 Gender: Female Account #: [id number] Procedure:                Upper GI endoscopy Indications:              Dyspepsia, Suspected gastro-esophageal reflux                            disease Medicines:                Monitored Anesthesia Care Procedure:                Pre-Anesthesia Assessment:                           - Prior to the procedure, a History and Physical                            was performed, and patient medications and                            allergies were reviewed. The patient's tolerance of                            previous anesthesia was also reviewed. The risks                            and benefits of the procedure and the sedation                            options and risks were discussed with the patient.                            All questions were answered, and informed consent                            was obtained. Prior Anticoagulants: The patient has                            taken no previous anticoagulant or antiplatelet                            agents. ASA Grade Assessment: II - A patient with                            mild systemic disease. After reviewing the risks                            and benefits, the patient was deemed in                            satisfactory condition to undergo the procedure.  After obtaining informed consent, the endoscope was                            passed under direct vision. Throughout the                            procedure, the patient's blood pressure, pulse, and                            oxygen saturations were monitored continuously. The                            Endoscope was introduced through the mouth, and                            advanced to the second part of duodenum.  The upper                            GI endoscopy was accomplished without difficulty.                            The patient tolerated the procedure well. Scope In: Scope Out: Findings:                 The esophagus was normal.                           No gross lesions were noted in the entire examined                            stomach. Biopsies were taken with a cold forceps                            for Helicobacter pylori testing using CLOtest.                           The examined duodenum was normal. Complications:            No immediate complications. Estimated Blood Loss:     Estimated blood loss was minimal. Impression:               - Normal esophagus.                           - No gross lesions in the stomach. Biopsied.                           - Normal examined duodenum. Recommendation:           - Patient has a contact number available for                            emergencies. The signs and symptoms of potential                            delayed complications were discussed with the  patient. Return to normal activities tomorrow.                            Written discharge instructions were provided to the                            patient.                           - Resume previous diet.                           - Continue present medications.                           - Return to GI clinic PRN.                           - Will need to consider colonoscopy for further                            evaluation if continues to have persistent iron                            deficiency anemia                           -Follow up CBC and ferritin in 3 months Mauri Pole, MD 07/28/2016 3:32:28 PM This report has been signed electronically.

## 2016-07-28 NOTE — Patient Instructions (Signed)
YOU HAD AN ENDOSCOPIC PROCEDURE TODAY AT [facility]:   Refer to the procedure report that was given to you for any specific questions about what was found during the examination.  If the procedure report does not answer your questions, please call your gastroenterologist to clarify.  If you requested that your care partner not be given the details of your procedure findings, then the procedure report has been included in a sealed envelope for you to review at your convenience later.  YOU SHOULD EXPECT: Some feelings of bloating in the abdomen. Passage of more gas than usual.  Walking can help get rid of the air that was put into your GI tract during the procedure and reduce the bloating. If you had a lower endoscopy (such as a colonoscopy or flexible sigmoidoscopy) you may notice spotting of blood in your stool or on the toilet paper. If you underwent a bowel prep for your procedure, you may not have a normal bowel movement for a few days.  Please Note:  You might notice some irritation and congestion in your nose or some drainage.  This is from the oxygen used during your procedure.  There is no need for concern and it should clear up in a day or so.  SYMPTOMS TO REPORT IMMEDIATELY:   Following upper endoscopy (EGD)  Vomiting of blood or coffee ground material  New chest pain or pain under the shoulder blades  Painful or persistently difficult swallowing  New shortness of breath  Fever of 100F or higher  Black, tarry-looking stools  For urgent or emergent issues, a gastroenterologist can be reached at any hour by calling [phone].   DIET:  We do recommend a small meal at first, but then you may proceed to your regular diet.  Drink plenty of fluids but you should avoid alcoholic beverages for 24 hours.  MEDICATIONS: Continue present medications.  Return to GI clinic as needed.  ACTIVITY:  You should plan to take it easy for the rest of today and you should NOT DRIVE  or use heavy machinery until tomorrow (because of the sedation medicines used during the test).    FOLLOW UP: Our staff will call the number listed on your records the next business day following your procedure to check on you and address any questions or concerns that you may have regarding the information given to you following your procedure. If we do not reach you, we will leave a message.  However, if you are feeling well and you are not experiencing any problems, there is no need to return our call.  We will assume that you have returned to your regular daily activities without incident.  If any biopsies were taken you will be contacted by phone or by letter within the next 1-3 weeks.  Please call us at [phone] if you have not heard about the biopsies in 3 weeks.   Thank you for allowing [place] to provide for your healthcare needs today.  SIGNATURES/CONFIDENTIALITY: You and/or your care partner have signed paperwork which will be entered into your electronic medical record.  These signatures attest to the fact that that the information above on your After Visit Summary has been reviewed and is understood.  Full responsibility of the confidentiality of this discharge information lies with you and/or your care-partner.

## 2016-07-28 NOTE — Progress Notes (Signed)
Called to room to assist during endoscopic procedure.  Patient ID and intended procedure confirmed with present staff. Received instructions for my participation in the procedure from the performing physician.  

## 2016-07-29 ENCOUNTER — Telehealth: Payer: Self-pay

## 2016-07-29 NOTE — Telephone Encounter (Signed)
  Follow up Call-  Call back number 07/28/2016  Post procedure Call Back phone  # [phone]  Permission to leave phone message Yes  Some recent data might be hidden     Patient questions:  Do you have a fever, pain , or abdominal swelling? No. Pain Score  0 *  Have you tolerated food without any problems? Yes.    Have you been able to return to your normal activities? Yes.    Do you have any questions about your discharge instructions: Diet   No. Medications  No. Follow up visit  No.  Do you have questions or concerns about your Care? No.  Actions: * If pain score is 4 or above: No action needed, pain <4.  No problems noted per pt. maw

## 2016-07-30 ENCOUNTER — Other Ambulatory Visit: Payer: Self-pay

## 2016-07-30 DIAGNOSIS — A048 Other specified bacterial intestinal infections: Secondary | ICD-10-CM

## 2016-07-30 MED ORDER — ESOMEPRAZOLE MAGNESIUM 40 MG PO PACK: 40.0000 mg | PACK | Freq: Every day | ORAL | 0 refills | Status: DC

## 2016-07-30 MED ORDER — BIS SUBCIT-METRONID-TETRACYC 140-125-125 MG PO CAPS: 3.0000 | ORAL_CAPSULE | Freq: Three times a day (TID) | ORAL | 0 refills | Status: DC

## 2016-10-11 ENCOUNTER — Other Ambulatory Visit: Payer: Self-pay

## 2016-10-11 ENCOUNTER — Telehealth: Payer: Self-pay | Admitting: Gastroenterology

## 2016-10-11 NOTE — Telephone Encounter (Signed)
Left a message to confirm pharmacy.

## 2016-10-11 NOTE — Telephone Encounter (Signed)
Yes, can re-try Dexilant once stool is submitted Followup with [place]

## 2016-10-11 NOTE — Telephone Encounter (Signed)
  Doc of Day Patient treated for H Pylori in June. She did not have her follow up testing for eradication of H Pylori. She is having bloating and stomach discomfort. She is not on any PPI. She will turn in her stool specimen for testing. She asks if she can try Dexilant after she submits her specimen. Please advise.

## 2016-10-12 ENCOUNTER — Other Ambulatory Visit: Payer: Self-pay

## 2016-10-12 MED ORDER — DEXLANSOPRAZOLE 60 MG PO CPDR: 60.0000 mg | DELAYED_RELEASE_CAPSULE | Freq: Every day | ORAL | 3 refills | Status: DC

## 2016-10-12 NOTE — Telephone Encounter (Signed)
rx transmitted to [facility]

## 2016-10-13 ENCOUNTER — Telehealth: Payer: Self-pay | Admitting: Gastroenterology

## 2016-10-13 NOTE — Telephone Encounter (Signed)
Dexilant approved through Cover My Meds today

## 2016-10-13 NOTE — Telephone Encounter (Signed)
Working on prior auth through cover my meds

## 2016-11-08 ENCOUNTER — Encounter: Payer: Self-pay | Admitting: Internal Medicine

## 2016-11-08 ENCOUNTER — Ambulatory Visit (INDEPENDENT_AMBULATORY_CARE_PROVIDER_SITE_OTHER): Payer: Managed Care, Other (non HMO) | Admitting: Internal Medicine

## 2016-11-08 VITALS — BP 108/62 | HR 86 | Temp 98.6°F | Ht 66.0 in | Wt 209.4 lb

## 2016-11-08 DIAGNOSIS — D500 Iron deficiency anemia secondary to blood loss (chronic): Secondary | ICD-10-CM

## 2016-11-08 DIAGNOSIS — R1310 Dysphagia, unspecified: Secondary | ICD-10-CM

## 2016-11-08 DIAGNOSIS — G8929 Other chronic pain: Secondary | ICD-10-CM | POA: Diagnosis not present

## 2016-11-08 DIAGNOSIS — A048 Other specified bacterial intestinal infections: Secondary | ICD-10-CM

## 2016-11-08 DIAGNOSIS — R1013 Epigastric pain: Secondary | ICD-10-CM | POA: Diagnosis not present

## 2016-11-08 MED ORDER — POLYETHYLENE GLYCOL 3350 17 GM/SCOOP PO POWD: 17.0000 g | Freq: Every day | ORAL | 3 refills | Status: DC

## 2016-11-08 MED ORDER — FLUTICASONE PROPIONATE 50 MCG/ACT NA SUSP: 2.0000 | Freq: Every day | NASAL | 1 refills | Status: DC

## 2016-11-08 MED ORDER — LORATADINE 10 MG PO TABS: 10.0000 mg | ORAL_TABLET | Freq: Every day | ORAL | 11 refills | Status: DC

## 2016-11-08 NOTE — Patient Instructions (Signed)
Ms. Altman,  For your sore throat, I think you have post-nasal drip from allergies.  Use 2 sprays of flonase daily and restart daily claritin. Honey and chloraseptic spray can help with sore throat. Robitussin-DM can help with cough and mucus. Drink plenty of water to help clear up mucus.  Please bring your stool sample to help [place] find out if you still have the H. Pylori infection, which causes ulcers. If still positive, I will prescribe another round of antibiotics. In the meantime, please get an appointment with gastroenterology.  Best, Dr. Ola Spurr

## 2016-11-09 LAB — CBC
HEMOGLOBIN: 8.6 g/dL — AB (ref 11.1–15.9)
Hematocrit: 29.5 % — ABNORMAL LOW (ref 34.0–46.6)
MCH: 18.8 pg — AB (ref 26.6–33.0)
MCHC: 29.2 g/dL — AB (ref 31.5–35.7)
MCV: 65 fL — ABNORMAL LOW (ref 79–97)
PLATELETS: 256 10*3/uL (ref 150–379)
RBC: 4.57 x10E6/uL (ref 3.77–5.28)
RDW: 17.4 % — ABNORMAL HIGH (ref 12.3–15.4)
WBC: 3.7 10*3/uL (ref 3.4–10.8)

## 2016-11-09 LAB — FERRITIN: FERRITIN: 5 ng/mL — AB (ref 15–150)

## 2016-11-09 NOTE — Progress Notes (Signed)
[name] Progress Note  Subjective:  Tamara Watson is a 46 y.o. female with history of iron deficiency anemia 2/2 menorrhagia (has not adhered to supplementation in past) and allergic rhinitis who presents for continued GI upset and new trouble swallowing.  #Trouble swallowing: - Present for 1 week - has noticed feeling like bread and meat gets stuck - has also noticed sensation of mucus building up in her upper chest - not regularly using flonase; ran out of claritin - symptoms not present before cold symptoms ROS: no fevers; reports cough  #GI upset: - Continues to struggle with feeling full early and having bloating and belching problems.  - Taking 2 prilosec at night and dexilant; does not feel this helps much - Had normal EGD this summer but positive biopsy for H. pylori; GI recommended repeating CBC and ferritin and if still low continuing work-up with colonoscopy and also recommended checking stool with antigen test for clearance of H. Pylori - Completed treatment course for H pylori with bismuth-metronidazole-tetracycline pill and protonix. Denies trouble taking medications.  - Patient not able to complete stool test yet - Needs to "take something" in order to have BM; using a green tea product. Otherwise goes a couple days between BMs - Denies dark stools   No Known Allergies  Objective: Blood pressure 108/62, pulse 86, temperature 98.6 F (37 C), temperature source Oral, height 5\' 6"  (1.676 m), weight 209 lb 6.4 oz (95 kg), SpO2 100 %. Body mass index is 33.8 kg/m. Constitutional: Obese female, pleasant, in NAD HENT: Swelling and erythema of nasal turbinates, mild erythema of posterior oropharynx Cardiovascular: RRR, S1, S2, no m/r/g.  Pulmonary/Chest: Effort normal and breath sounds normal. No respiratory distress.  Abdominal: +BS, soft, NT, ND Musculoskeletal: Normal tone and bulk Neurological: AOx3, no focal deficits. Normal elevation of  palate.  Skin: Skin is warm and dry. No rash noted.  Psychiatric: Normal mood and affect.  Vitals reviewed  Assessment/Plan: Dysphagia - Patient reports subjective trouble swallowing. Onset with nasal congestion. Recent normal EGD and no weight loss reassuring. Suspect symptoms from throat irritation due to post-nasal drip. - Recommended restarting claritin and increasing flonase to 2 sprays daily. Honey and chloraseptic spray to help with sore throat. Suggested robitussin-DM to help with cough and mucus, as well as staying well hydrated  Abdominal pain, chronic, epigastric - Will obtain labs recommended by GI (repeat CBC and ferritin; stool ag test for H. Pylori) - Recommended patient make follow-up with GI, as colonoscopy was to be next step of evaluation - If stool ag test still positive for H. pylori would repeat treatment - Recommended daily miralax to promote more regular BMs  Follow-up with GI.  [name], MD [place], PGY-3

## 2016-11-10 DIAGNOSIS — R1310 Dysphagia, unspecified: Secondary | ICD-10-CM | POA: Insufficient documentation

## 2016-11-10 NOTE — Assessment & Plan Note (Signed)
-   Patient reports subjective trouble swallowing. Onset with nasal congestion. Recent normal EGD and no weight loss reassuring. Suspect symptoms from throat irritation due to post-nasal drip. - Recommended restarting claritin and increasing flonase to 2 sprays daily. Honey and chloraseptic spray to help with sore throat. Suggested robitussin-DM to help with cough and mucus, as well as staying well hydrated

## 2016-11-10 NOTE — Assessment & Plan Note (Signed)
-   Will obtain labs recommended by GI (repeat CBC and ferritin; stool ag test for H. Pylori) - Recommended patient make follow-up with GI, as colonoscopy was to be next step of evaluation - If stool ag test still positive for H. pylori would repeat treatment - Recommended daily miralax to promote more regular BMs

## 2016-11-11 ENCOUNTER — Other Ambulatory Visit: Payer: Self-pay | Admitting: Internal Medicine

## 2016-11-11 DIAGNOSIS — R059 Cough, unspecified: Secondary | ICD-10-CM

## 2016-11-11 DIAGNOSIS — R05 Cough: Secondary | ICD-10-CM

## 2016-11-11 MED ORDER — BENZONATATE 100 MG PO CAPS: 100.0000 mg | ORAL_CAPSULE | Freq: Three times a day (TID) | ORAL | 0 refills | Status: DC | PRN

## 2016-11-11 MED ORDER — SENNA 8.6 MG PO TABS: 2.0000 | ORAL_TABLET | Freq: Every day | ORAL | 1 refills | Status: DC

## 2016-11-11 MED ORDER — SENNA 8.6 MG PO TABS
2.0000 | ORAL_TABLET | Freq: Every day | ORAL | 1 refills | Status: DC
Start: 2016-11-11 — End: 2016-11-17

## 2016-11-16 LAB — H. PYLORI ANTIGEN, STOOL

## 2016-11-17 ENCOUNTER — Other Ambulatory Visit: Payer: Self-pay | Admitting: *Deleted

## 2016-11-17 DIAGNOSIS — R059 Cough, unspecified: Secondary | ICD-10-CM

## 2016-11-17 DIAGNOSIS — R05 Cough: Secondary | ICD-10-CM

## 2016-11-17 MED ORDER — BENZONATATE 100 MG PO CAPS: 100.0000 mg | ORAL_CAPSULE | Freq: Three times a day (TID) | ORAL | 0 refills | Status: DC | PRN

## 2016-11-17 MED ORDER — SENNA 8.6 MG PO TABS: 2.0000 | ORAL_TABLET | Freq: Every day | ORAL | 1 refills | Status: DC

## 2016-11-17 NOTE — Telephone Encounter (Signed)
Pt states [facility] never received meds sent on 11/11/16.  Attempted to call but was on hold for 15 minutes.  Explained to patient that I resent electronically and to give [place] a call tomorrow if they still do not have. Nakeitha Milligan, Salome Spotted, CMA

## 2016-11-30 ENCOUNTER — Other Ambulatory Visit: Payer: Self-pay | Admitting: *Deleted

## 2016-11-30 DIAGNOSIS — R1013 Epigastric pain: Principal | ICD-10-CM

## 2016-11-30 DIAGNOSIS — G8929 Other chronic pain: Secondary | ICD-10-CM

## 2016-12-03 LAB — SPECIMEN STATUS REPORT

## 2016-12-07 LAB — H. PYLORI ANTIGEN, STOOL: H pylori Ag, Stl: NEGATIVE

## 2016-12-09 ENCOUNTER — Telehealth: Payer: Self-pay | Admitting: *Deleted

## 2016-12-09 MED ORDER — DEXLANSOPRAZOLE 60 MG PO CPDR: 60.0000 mg | DELAYED_RELEASE_CAPSULE | Freq: Every day | ORAL | 3 refills | Status: DC

## 2016-12-09 NOTE — Telephone Encounter (Signed)
Sent in 47 day supply of Dexilant as requested from pharmacy

## 2016-12-10 ENCOUNTER — Encounter: Payer: Self-pay | Admitting: Internal Medicine

## 2016-12-22 ENCOUNTER — Telehealth: Payer: Self-pay | Admitting: *Deleted

## 2016-12-22 MED ORDER — DEXLANSOPRAZOLE 60 MG PO CPDR: 60.0000 mg | DELAYED_RELEASE_CAPSULE | Freq: Every day | ORAL | 3 refills | Status: DC

## 2016-12-22 NOTE — Telephone Encounter (Signed)
Sent in request for 90 day supply of Dexilant as requested from [facility]

## 2017-02-03 ENCOUNTER — Other Ambulatory Visit: Payer: Self-pay | Admitting: *Deleted

## 2017-02-03 DIAGNOSIS — R05 Cough: Secondary | ICD-10-CM

## 2017-02-03 DIAGNOSIS — R059 Cough, unspecified: Secondary | ICD-10-CM

## 2017-02-03 MED ORDER — BENZONATATE 100 MG PO CAPS: 100.0000 mg | ORAL_CAPSULE | Freq: Three times a day (TID) | ORAL | 0 refills | Status: DC | PRN

## 2017-03-02 ENCOUNTER — Encounter: Payer: Self-pay | Admitting: Physician Assistant

## 2017-03-02 ENCOUNTER — Ambulatory Visit (INDEPENDENT_AMBULATORY_CARE_PROVIDER_SITE_OTHER): Payer: Managed Care, Other (non HMO) | Admitting: Physician Assistant

## 2017-03-02 VITALS — BP 90/58 | HR 100 | Ht 65.0 in | Wt 208.8 lb

## 2017-03-02 DIAGNOSIS — R140 Abdominal distension (gaseous): Secondary | ICD-10-CM | POA: Diagnosis not present

## 2017-03-02 DIAGNOSIS — R1310 Dysphagia, unspecified: Secondary | ICD-10-CM

## 2017-03-02 DIAGNOSIS — K219 Gastro-esophageal reflux disease without esophagitis: Secondary | ICD-10-CM

## 2017-03-02 MED ORDER — DEXLANSOPRAZOLE 60 MG PO CPDR: 60.0000 mg | DELAYED_RELEASE_CAPSULE | Freq: Every day | ORAL | 11 refills | Status: DC

## 2017-03-02 NOTE — Progress Notes (Signed)
Reviewed and agree with documentation and assessment and plan. K. Veena Nandigam , MD   

## 2017-03-02 NOTE — Patient Instructions (Addendum)
We have sent the following medications to your pharmacy for you to pick up at your convenience: [place].  Try FD Donald Prose, over the counter. Take 1 tab daily for bloating and gas.  W sent refills for Dexilant 60 mg.   You have been scheduled for a Barium Esophogram at [facility] (1st floor of the hospital) on Wednesday 03-16-2017 at 9:30 am. Please arrive 15 minutes prior to your appointment for registration. Make certain not to have anything to eat or drink 6 hours prior to your test. If you need to reschedule for any reason, please contact radiology at [phone] to do so. __________________________________________________________________ A barium swallow is an examination that concentrates on views of the esophagus. This tends to be a double contrast exam (barium and two liquids which, when combined, create a gas to distend the wall of the oesophagus) or single contrast (non-ionic iodine based). The study is usually tailored to your symptoms so a good history is essential. Attention is paid during the study to the form, structure and configuration of the esophagus, looking for functional disorders (such as aspiration, dysphagia, achalasia, motility and reflux) EXAMINATION You may be asked to change into a gown, depending on the type of swallow being performed. A radiologist and radiographer will perform the procedure. The radiologist will advise you of the type of contrast selected for your procedure and direct you during the exam. You will be asked to stand, sit or lie in several different positions and to hold a small amount of fluid in your mouth before being asked to swallow while the imaging is performed .In some instances you may be asked to swallow barium coated marshmallows to assess the motility of a solid food bolus. The exam can be recorded as a digital or video fluoroscopy procedure. POST PROCEDURE It will take 1-2 days for the barium to pass through your system. To  facilitate this, it is important, unless otherwise directed, to increase your fluids for the next 24-48hrs and to resume your normal diet.  This test typically takes about 30 minutes to perform. __________________________________________________________________________________

## 2017-03-02 NOTE — Progress Notes (Signed)
Subjective:    Patient ID: Tamara Watson, female    DOB: Feb 25, 1970, 47 y.o.   MRN: [id number]  HPI Tamara Watson is a pleasant 47 year old African female, known to Dr. Silverio Decamp with history of chronic GERD. She was last seen in our office in June 2018 and at that time underwent endoscopy with finding of a normal esophagus and otherwise normal exam. Biopsies were taken for H. Pylori, these were positive and she was subsequently treated with a course of Pylera. She has had H. Pylori stool antigen done as of October 2018 which was negative. Patient is currently on DEXA want 60 mg by mouth every morning and also taking omeprazole as needed at bedtime.She has no current complaints of heartburn but continues to complain of bloating and gas and belching. She has also had several month history of which she describes as difficulty swallowing and a feeling of fullness in her throat. She says the dysphagia symptoms, and go sometimes worse in the early mornings. She has had some difficulty with solids and liquids but more so with solids. She says food feels as if it sits in her esophagus for a while then gradually goes down. She has not had any episodes requiring regurgitation. She has been eating very small bites. Her appetite is been fine and weight has been stable. She does have mild to moderate problems with constipation and uses Senokot when necessary though doesn't require daily. She says she has adjusted her diet and cut back on spicy foods and stopped drinking wine but continues to have problems with gas and belching. She has tried Gas-X with some relief. Patient also has history of iron deficiency anemia which has been attributed to menorrhagia. She continues to have very heavy periods and says she's not ready for hysterectomy. Last labs done in October 2018 hemoglobin 8.6 hematocrit of 29.5 and ferritin of 5. She cannot tolerate oral iron because of constipation, is currently taking an iron tonic in  liquid form but admits she's not taking it daily.  Review of Systems Pertinent positive and negative review of systems were noted in the above HPI section.  All other review of systems was otherwise negative.  Outpatient Encounter Medications as of 03/02/2017  Medication Sig  . benzonatate (TESSALON) 100 MG capsule Take 1 capsule (100 mg total) by mouth 3 (three) times daily as needed for cough.  . dexlansoprazole (DEXILANT) 60 MG capsule Take 1 capsule (60 mg total) by mouth daily before breakfast.  . fluticasone (FLONASE) 50 MCG/ACT nasal spray Place 2 sprays into both nostrils daily.  Marland Kitchen ibuprofen (ADVIL,MOTRIN) 200 MG tablet Take 600 mg by mouth every 6 (six) hours as needed for mild pain.  Marland Kitchen loratadine (CLARITIN) 10 MG tablet Take 1 tablet (10 mg total) by mouth daily.  Marland Kitchen omeprazole (PRILOSEC) 40 MG capsule Take 40 mg by mouth at bedtime.  . polyethylene glycol powder (GLYCOLAX/MIRALAX) powder Take 17 g by mouth daily. One capful daily in 8 ounces of water.  . senna (SENOKOT) 8.6 MG TABS tablet Take 2 tablets (17.2 mg total) by mouth daily.  . [DISCONTINUED] dexlansoprazole (DEXILANT) 60 MG capsule Take 1 capsule (60 mg total) by mouth daily before breakfast.   Facility-Administered Encounter Medications as of 03/02/2017  Medication  . 0.9 %  sodium chloride infusion   No Known Allergies Patient Active Problem List   Diagnosis Date Noted  . Dysphagia 11/10/2016  . Abdominal pain, chronic, epigastric 03/03/2016  . Allergic rhinitis 03/01/2014  . Iron deficiency  anemia 05/07/2011  . PALPITATIONS 04/23/2009  . NONSPEC REACT TUBERCULIN SKN TEST W/O ACTV TB 07/03/2008  . MENORRHAGIA 06/16/2007  . ALOPECIA 06/16/2007  . OBESITY 06/08/2006  . DYSMENORRHEA 06/08/2006  . ANXIETY 03/31/2006   Social History   Socioeconomic History  . Marital status: Married    Spouse name: Not on file  . Number of children: 3  . Years of education: Not on file  . Highest education level: Not on file   Social Needs  . Financial resource strain: Not on file  . Food insecurity - worry: Not on file  . Food insecurity - inability: Not on file  . Transportation needs - medical: Not on file  . Transportation needs - non-medical: Not on file  Occupational History  . Not on file  Tobacco Use  . Smoking status: Never Smoker  . Smokeless tobacco: Never Used  Substance and Sexual Activity  . Alcohol use: Yes    Alcohol/week: 0.6 oz    Types: 1 Glasses of wine per week  . Drug use: No  . Sexual activity: Yes  Other Topics Concern  . Not on file  Social History Narrative  . Not on file    Tamara Watson's family history includes Breast cancer in her maternal aunt; Cancer in her maternal aunt and paternal grandmother; Diabetes in her mother; Hyperlipidemia in her father and mother; Ovarian cancer in her maternal aunt; Pancreatic cancer in her paternal grandmother.      Objective:    Vitals:   03/02/17 1405  BP: (!) 90/58  Pulse: 100    Physical Exam;well-developed African female in no acute distress, pleasant blood pressure 90/50 pulse 100, weight 208, height 5 foot 5, BMI 34.7. HEENT; nontraumatic normocephalic EOMI PERRLA sclera anicteric, Cardiovascular ;regular rate and rhythm with S1-S2 no murmur rub or gallop, Pulmonary; clear bilaterally, Abdomen soft, nontender; nondistended ;no palpable mass or hepatosplenomegaly bowel sounds are present, Rectal ;exam not done, Ext; no clubbing cyanosis or edema skin warm and dry, Neuropsych; mood and affect appropriate       Assessment & Plan:   #31 47 year old female with chronic GERD on Dexilant who comes in with several month history of dysphagia to solids and liquids and a constant sensation of fullness in the throat. Patient had negative EGD in June 2018. Doubt esophageal stricture or ring Will rule out esophageal dysmotility. Her symptoms may be manifestation of LPR. #2 dyspepsia with gas bloating and belching #3 lactose  intolerance #4 constipation #5 chronic iron deficiency anemia felt secondary to menorrhagia  Plan; continue strict antireflux regimen Continue Dexilant  60 mg by mouth every morning Patient will be scheduled for barium swallow with tablet She will continue Senokot on a when necessary basis Advise she try Gas-X with meals and also try FD guard 1 by mouth twice a day. Lactose avoidance. Patient has not been taking an iron supplement on a regular basis, advise she increase her liquid iron supplement to at least 1 dose daily (100 mg elemental iron ) Further plans pending results of barium swallow.  Tamara Lumbert S Keileigh Vahey PA-C 03/02/2017   Cc: Larey Seat*

## 2017-03-16 ENCOUNTER — Ambulatory Visit (HOSPITAL_COMMUNITY)
Admission: RE | Admit: 2017-03-16 | Discharge: 2017-03-16 | Disposition: A | Discharge status: Home / Self Care | Payer: Managed Care, Other (non HMO) | Source: Ambulatory Visit | Attending: Physician Assistant | Admitting: Physician Assistant

## 2017-03-16 DIAGNOSIS — K224 Dyskinesia of esophagus: Secondary | ICD-10-CM | POA: Insufficient documentation

## 2017-03-16 DIAGNOSIS — R140 Abdominal distension (gaseous): Secondary | ICD-10-CM | POA: Diagnosis present

## 2017-03-16 DIAGNOSIS — K219 Gastro-esophageal reflux disease without esophagitis: Secondary | ICD-10-CM | POA: Insufficient documentation

## 2017-03-16 DIAGNOSIS — R1310 Dysphagia, unspecified: Secondary | ICD-10-CM | POA: Diagnosis present

## 2017-03-30 ENCOUNTER — Encounter: Payer: Self-pay | Admitting: Gastroenterology

## 2017-03-30 ENCOUNTER — Other Ambulatory Visit: Payer: Self-pay

## 2017-03-30 ENCOUNTER — Ambulatory Visit (AMBULATORY_SURGERY_CENTER): Payer: Self-pay | Admitting: *Deleted

## 2017-03-30 VITALS — Ht 65.5 in | Wt 208.0 lb

## 2017-03-30 DIAGNOSIS — R1310 Dysphagia, unspecified: Secondary | ICD-10-CM

## 2017-03-30 NOTE — Progress Notes (Signed)
No egg or soy allergy known to patient  No issues with past sedation with any surgeries  or procedures, no intubation problems  No diet pills per patient No home 02 use per patient  No blood thinners per patient  Pt  issues with constipation - uses sennacot and miralax  No A fib or A flutter but has tachycardia  EMMI video sent to pt's e mail -

## 2017-04-05 ENCOUNTER — Encounter: Payer: Self-pay | Admitting: Gastroenterology

## 2017-04-13 ENCOUNTER — Ambulatory Visit (AMBULATORY_SURGERY_CENTER): Payer: Managed Care, Other (non HMO) | Admitting: Gastroenterology

## 2017-04-13 ENCOUNTER — Encounter: Payer: Self-pay | Admitting: Gastroenterology

## 2017-04-13 VITALS — BP 134/72 | HR 86 | Temp 98.6°F | Resp 14 | Ht 65.0 in | Wt 208.0 lb

## 2017-04-13 DIAGNOSIS — R1310 Dysphagia, unspecified: Secondary | ICD-10-CM

## 2017-04-13 DIAGNOSIS — K208 Other esophagitis: Secondary | ICD-10-CM

## 2017-04-13 DIAGNOSIS — R1319 Other dysphagia: Secondary | ICD-10-CM

## 2017-04-13 MED ORDER — SODIUM CHLORIDE 0.9 % IV SOLN: 500.0000 mL | Freq: Once | INTRAVENOUS | Status: DC

## 2017-04-13 MED ORDER — FLUCONAZOLE 100 MG PO TABS: 100.0000 mg | ORAL_TABLET | Freq: Every day | ORAL | 0 refills | Status: DC

## 2017-04-13 NOTE — Progress Notes (Signed)
Report to PACU, RN, vss, BBS= Clear.  

## 2017-04-13 NOTE — Patient Instructions (Signed)
YOU HAD AN ENDOSCOPIC PROCEDURE TODAY AT THE [facility]:   Refer to the procedure report that was given to you for any specific questions about what was found during the examination.  If the procedure report does not answer your questions, please call your gastroenterologist to clarify.  If you requested that your care partner not be given the details of your procedure findings, then the procedure report has been included in a sealed envelope for you to review at your convenience later.  YOU SHOULD EXPECT: Some feelings of bloating in the abdomen. Passage of more gas than usual.  Walking can help get rid of the air that was put into your GI tract during the procedure and reduce the bloating. If you had a lower endoscopy (such as a colonoscopy or flexible sigmoidoscopy) you may notice spotting of blood in your stool or on the toilet paper. If you underwent a bowel prep for your procedure, you may not have a normal bowel movement for a few days.  Please Note:  You might notice some irritation and congestion in your nose or some drainage.  This is from the oxygen used during your procedure.  There is no need for concern and it should clear up in a day or so.  SYMPTOMS TO REPORT IMMEDIATELY:   Following lower endoscopy (colonoscopy or flexible sigmoidoscopy):  Excessive amounts of blood in the stool  Significant tenderness or worsening of abdominal pains  Swelling of the abdomen that is new, acute  Fever of 100F or higher   Following upper endoscopy (EGD)  Vomiting of blood or coffee ground material  New chest pain or pain under the shoulder blades  Painful or persistently difficult swallowing  New shortness of breath  Fever of 100F or higher  Black, tarry-looking stools  For urgent or emergent issues, a gastroenterologist can be reached at any hour by calling [phone].   DIET:  We do recommend a small meal at first, but then you may proceed to your regular diet.  Drink  plenty of fluids but you should avoid alcoholic beverages for 24 hours.  ACTIVITY:  You should plan to take it easy for the rest of today and you should NOT DRIVE or use heavy machinery until tomorrow (because of the sedation medicines used during the test).    FOLLOW UP: Our staff will call the number listed on your records the next business day following your procedure to check on you and address any questions or concerns that you may have regarding the information given to you following your procedure. If we do not reach you, we will leave a message.  However, if you are feeling well and you are not experiencing any problems, there is no need to return our call.  We will assume that you have returned to your regular daily activities without incident.  If any biopsies were taken you will be contacted by phone or by letter within the next 1-3 weeks.  Please call us at [phone] if you have not heard about the biopsies in 3 weeks.    SIGNATURES/CONFIDENTIALITY: You and/or your care partner have signed paperwork which will be entered into your electronic medical record.  These signatures attest to the fact that that the information above on your After Visit Summary has been reviewed and is understood.  Full responsibility of the confidentiality of this discharge information lies with you and/or your care-partner. 

## 2017-04-13 NOTE — Op Note (Signed)
[place] Patient Name: Tamara Watson Procedure Date: 04/13/2017 3:32 PM MRN: [id number] Endoscopist: Mauri Pole , MD Age: 47 Referring MD:  Date of Birth: 1970/04/24 Gender: Female Account #: [id number] Procedure:                Upper GI endoscopy Indications:              Dysphagia Medicines:                Monitored Anesthesia Care Procedure:                Pre-Anesthesia Assessment:                           - Prior to the procedure, a History and Physical                            was performed, and patient medications and                            allergies were reviewed. The patient's tolerance of                            previous anesthesia was also reviewed. The risks                            and benefits of the procedure and the sedation                            options and risks were discussed with the patient.                            All questions were answered, and informed consent                            was obtained. Prior Anticoagulants: The patient has                            taken no previous anticoagulant or antiplatelet                            agents. ASA Grade Assessment: II - A patient with                            mild systemic disease. After reviewing the risks                            and benefits, the patient was deemed in                            satisfactory condition to undergo the procedure.                           After obtaining informed consent, the endoscope was  passed under direct vision. Throughout the                            procedure, the patient's blood pressure, pulse, and                            oxygen saturations were monitored continuously. The                            Endoscope was introduced through the mouth, and                            advanced to the second part of duodenum. The upper                            GI endoscopy was accomplished  without difficulty.                            The patient tolerated the procedure well. Scope In: Scope Out: Findings:                 Diffuse white plaques with denuded mucosa                            suggestive of candidiasis was found in the entire                            esophagus. Biopsies were taken with a cold forceps                            for histology.                           Esophagogastric landmarks were identified: the                            Z-line was found at 36 cm and the gastroesophageal                            junction was found at 38 cm from the incisors.                           The stomach was normal.                           The examined duodenum was normal. Complications:            No immediate complications. Estimated Blood Loss:     Estimated blood loss was minimal. Impression:               - Monilial esophagitis. Biopsied.                           - Esophagogastric landmarks identified.                           -  Normal stomach.                           - Normal examined duodenum. Recommendation:           - Resume previous diet.                           - Continue present medications.                           - Await pathology results.                           - Diflucan (fluconazole) 100 mg PO daily for 10                            days.                           - If continues to have persistent dysphagia, will                            consider esophageal manometry to exclude any major                            esophageal dysmotility Mauri Pole, MD 04/13/2017 3:45:26 PM This report has been signed electronically.

## 2017-04-13 NOTE — Progress Notes (Signed)
Called to room to assist during endoscopic procedure.  Patient ID and intended procedure confirmed with present staff. Received instructions for my participation in the procedure from the performing physician.  

## 2017-04-14 ENCOUNTER — Telehealth: Payer: Self-pay | Admitting: *Deleted

## 2017-04-14 ENCOUNTER — Telehealth: Payer: Self-pay

## 2017-04-14 NOTE — Telephone Encounter (Signed)
  Follow up Call-  Call back number 04/13/2017 07/28/2016  Post procedure Call Back phone  # [phone]  Permission to leave phone message Yes Yes  Some recent data might be hidden     Patient questions:  Do you have a fever, pain , or abdominal swelling? No. Pain Score  0 *  Have you tolerated food without any problems? Yes.    Have you been able to return to your normal activities? Yes.    Do you have any questions about your discharge instructions: Diet   No. Medications  No. Follow up visit  No.  Do you have questions or concerns about your Care? No.  Actions: * If pain score is 4 or above: No action needed, pain <4.

## 2017-04-14 NOTE — Telephone Encounter (Signed)
NO ANSWER, MESSAGE LEFT FOR PATIENT. 

## 2017-04-14 NOTE — Telephone Encounter (Signed)
Patient calling back to follow up and states she has questions and concerns. Patient would like a call back to discuss.

## 2017-05-23 ENCOUNTER — Telehealth: Payer: Self-pay

## 2017-05-23 NOTE — Telephone Encounter (Signed)
Patient agrees with this plan. Appointment 06/01/17 to evaluate.

## 2017-05-23 NOTE — Telephone Encounter (Signed)
Patient had her procedure 3/13/199omfortable after the procedure. She iced the area. 2 Days later she began applying heat. She calls to be certain it is documented she continues to have pain. It is her right wrist. Bending creates discomfort. More discomfort with bending up. She reports a knot beneath the skin is visible.

## 2017-05-23 NOTE — Telephone Encounter (Signed)
It is very unusual for patient to start having symptoms at the IV site almost 6 weeks later.  Please schedule office visit with extender to evaluate and see if she has thrombophlebitis.

## 2017-06-01 ENCOUNTER — Encounter: Payer: Self-pay | Admitting: Nurse Practitioner

## 2017-06-01 ENCOUNTER — Ambulatory Visit (INDEPENDENT_AMBULATORY_CARE_PROVIDER_SITE_OTHER): Payer: Self-pay | Admitting: Nurse Practitioner

## 2017-06-01 VITALS — BP 116/68 | HR 80 | Ht 65.5 in | Wt 206.5 lb

## 2017-06-01 DIAGNOSIS — I808 Phlebitis and thrombophlebitis of other sites: Secondary | ICD-10-CM

## 2017-06-01 DIAGNOSIS — M25539 Pain in unspecified wrist: Secondary | ICD-10-CM

## 2017-06-01 NOTE — Progress Notes (Signed)
      IMPRESSION and PLAN:    47 yo female with pain / swelling at site of peripheral IV placed at time of upper endoscopy mid March. Swelling / discoloration improved but still some intermittent discomfort when bending right wrist. On exam a vein on the dorsal aspect of right wrist has a segment of dark discoloration / slightly palpable cord, possibly resolving superficial thrombophlebitis -continue heat compresses prn pain -should continue to improve but if not then will arrange for u/s of RUE        HPI:    Chief Complaint:  Pain at IV site following EGD   Patient is a 47 yo female known to Dr. Silverio Decamp. She had an EGD in mid March for evaluation of dysphagia. Patient called the office with complains of pain and swelling at site peripheral IV. The procedure was nearly 6 weeks ago and discomfort was improving but we asked patient to come in anyway to have site looked at. She has no numbness of tingling in fingers. No RUE swelling or pain except occasionally when bending wrist. No fevers.    Physical Exam:     BP 116/68   Pulse 80   Ht 5' 5.5" (1.664 m)   Wt 206 lb 8 oz (93.7 kg)   BMI 33.84 kg/m   GENERAL:  Pleasant female in NAD PSYCH:  cooperative, normal affect Extremities: dorsal side of right wrist exam:  approx 1.5 " segment of vein which is a dark discoloration and with a slight cordlike texture. Site mildly tender. Normal radial pulse. No edema proximal or distal to affected area.   Tye Savoy , NP 06/01/2017, 3:18 PM

## 2017-06-01 NOTE — Patient Instructions (Addendum)
If you are age 45 or older, your body mass index should be between 23-30. Your Body mass index is 33.84 kg/m. If this is out of the aforementioned range listed, please consider follow up with your Primary Care Provider.  If you are age 47 or younger, your body mass index should be between 19-25. Your Body mass index is 33.84 kg/m. If this is out of the aformentioned range listed, please consider follow up with your Primary Care Provider.   Continue heat compresses throughout the day.  Thank you for choosing me and [facility].   Tye Savoy, NP

## 2017-06-04 ENCOUNTER — Encounter: Payer: Self-pay | Admitting: Nurse Practitioner

## 2017-06-06 NOTE — Progress Notes (Signed)
Reviewed and agree with documentation and assessment and plan. K. Veena Maly Lemarr , MD   

## 2017-06-28 ENCOUNTER — Ambulatory Visit: Payer: Self-pay | Admitting: Gastroenterology

## 2017-07-18 ENCOUNTER — Telehealth: Payer: Self-pay | Admitting: Nurse Practitioner

## 2017-07-18 ENCOUNTER — Other Ambulatory Visit: Payer: Self-pay

## 2017-07-18 MED ORDER — FLUCONAZOLE 100 MG PO TABS: 100.0000 mg | ORAL_TABLET | Freq: Every day | ORAL | 0 refills | Status: DC

## 2017-07-18 NOTE — Telephone Encounter (Signed)
Ok to send RX for fluconazole 100mg  daily X 10 days, no refills. Follow up in office on 6/19

## 2017-07-18 NOTE — Telephone Encounter (Signed)
Patient advised of Rx sent to her pharmacy and to keep appointment for 07/20/17.

## 2017-07-18 NOTE — Telephone Encounter (Signed)
Patient feels like esophageal yeast infection is coming back, for last week has had difficulty swallowing. She was supposed to follow up in May, had to cancel that appointment, needed a Wednesday. I have scheduled her follow up appointment for this Wednesday as appointment was available. Patient wanting to know if she needed another course of diflucan, which helped last time.

## 2017-07-20 ENCOUNTER — Encounter: Payer: Self-pay | Admitting: Gastroenterology

## 2017-07-20 ENCOUNTER — Ambulatory Visit (INDEPENDENT_AMBULATORY_CARE_PROVIDER_SITE_OTHER): Payer: Managed Care, Other (non HMO) | Admitting: Gastroenterology

## 2017-07-20 VITALS — BP 100/70 | HR 66 | Ht 65.5 in | Wt 209.4 lb

## 2017-07-20 DIAGNOSIS — R1310 Dysphagia, unspecified: Secondary | ICD-10-CM | POA: Diagnosis not present

## 2017-07-20 DIAGNOSIS — B3781 Candidal esophagitis: Secondary | ICD-10-CM | POA: Diagnosis not present

## 2017-07-20 MED ORDER — LINACLOTIDE 72 MCG PO CAPS: 72.0000 ug | ORAL_CAPSULE | Freq: Every day | ORAL | 3 refills | Status: DC

## 2017-07-20 NOTE — Progress Notes (Signed)
Tamara Watson    [id number]    01/18/1971  Primary Care Physician:Fitzgerald, Sharman Cheek, MD  Referring Physician: Rogue Bussing, MD [address] [place],  [id number]  Chief complaint: Dysphagia HPI:  47 year old female with history of Candida esophagitis here with complaints of recurrent dysphagia and pain on swallowing.  Patient was started on fluconazole 2 days ago with improvement of her symptoms.  She had similar symptoms prior to EGD in March 2019, showed diffuse white plaques throughout the esophagus, biopsies positive for Candida esophagitis.  Patient was treated with 10-day course of fluconazole with improvement of symptoms she was doing well until last week when she noticed recurrence of her symptoms She does have difficulty swallowing intermittently and has sensation of something back of her throat and has to clear phlegm constantly.  Denies any nausea, vomiting, loss of appetite or weight loss.  She is not on oral steroids or inhaled steroids.  She is taking Dexilant daily. Review of system positive for intermittent constipation, she is taking senna but has to take magnesium citrate occasionally due to worsening constipation.  Outpatient Encounter Medications as of 07/20/2017  Medication Sig  . benzonatate (TESSALON) 100 MG capsule Take 1 capsule (100 mg total) by mouth 3 (three) times daily as needed for cough.  . dexlansoprazole (DEXILANT) 60 MG capsule Take 1 capsule (60 mg total) by mouth daily before breakfast.  . fluconazole (DIFLUCAN) 100 MG tablet Take 1 tablet (100 mg total) by mouth daily.  . fluticasone (FLONASE) 50 MCG/ACT nasal spray Place 2 sprays into both nostrils daily.  Marland Kitchen ibuprofen (ADVIL,MOTRIN) 200 MG tablet Take 600 mg by mouth every 6 (six) hours as needed for mild pain.  Marland Kitchen loratadine (CLARITIN) 10 MG tablet Take 1 tablet (10 mg total) by mouth daily. (Patient taking differently: Take 10 mg by mouth daily as needed. )   . omeprazole (PRILOSEC) 40 MG capsule Take 40 mg by mouth at bedtime as needed.   . senna (SENOKOT) 8.6 MG TABS tablet Take 2 tablets (17.2 mg total) by mouth daily. (Patient taking differently: Take 2 tablets by mouth daily as needed. )  . [DISCONTINUED] polyethylene glycol powder (GLYCOLAX/MIRALAX) powder Take 17 g by mouth daily. One capful daily in 8 ounces of water.   Facility-Administered Encounter Medications as of 07/20/2017  Medication  . 0.9 %  sodium chloride infusion  . 0.9 %  sodium chloride infusion    Allergies as of 07/20/2017  . (No Known Allergies)    Past Medical History:  Diagnosis Date  . Allergy   . Anemia   . Anxiety    in school only   . Constipation    sennocot and miralax   . GERD (gastroesophageal reflux disease)     Past Surgical History:  Procedure Laterality Date  . BREAST LUMPECTOMY     20 yrs ago- benign   . DILATION AND CURETTAGE OF UTERUS     x2  . NSVD     x3  . TUBAL LIGATION      Family History  Problem Relation Age of Onset  . Cancer Maternal Aunt   . Ovarian cancer Maternal Aunt   . Cancer Paternal Grandmother   . Pancreatic cancer Paternal Grandmother   . Hyperlipidemia Mother   . Diabetes Mother   . Hyperlipidemia Father   . Breast cancer Maternal Aunt   . Colon cancer Neg Hx   . Esophageal cancer Neg  Hx   . Stomach cancer Neg Hx   . Rectal cancer Neg Hx   . Colon polyps Neg Hx     Social History   Socioeconomic History  . Marital status: Married    Spouse name: Not on file  . Number of children: 3  . Years of education: Not on file  . Highest education level: Not on file  Occupational History  . Not on file  Social Needs  . Financial resource strain: Not on file  . Food insecurity:    Worry: Not on file    Inability: Not on file  . Transportation needs:    Medical: Not on file    Non-medical: Not on file  Tobacco Use  . Smoking status: Never Smoker  . Smokeless tobacco: Never Used  Substance and  Sexual Activity  . Alcohol use: No    Frequency: Never    Comment: stopped drinking mid 1-19  . Drug use: No  . Sexual activity: Yes  Lifestyle  . Physical activity:    Days per week: Not on file    Minutes per session: Not on file  . Stress: Not on file  Relationships  . Social connections:    Talks on phone: Not on file    Gets together: Not on file    Attends religious service: Not on file    Active member of club or organization: Not on file    Attends meetings of clubs or organizations: Not on file    Relationship status: Not on file  . Intimate partner violence:    Fear of current or ex partner: Not on file    Emotionally abused: Not on file    Physically abused: Not on file    Forced sexual activity: Not on file  Other Topics Concern  . Not on file  Social History Narrative  . Not on file      Review of systems: Review of Systems  Constitutional: Negative for fever and chills.  HENT: Negative.   Eyes: Negative for blurred vision.  Respiratory: Negative for cough, shortness of breath and wheezing.   Cardiovascular: Negative for chest pain and palpitations.  Gastrointestinal: as per HPI Genitourinary: Negative for dysuria, urgency, frequency and hematuria.  Musculoskeletal: Negative for myalgias, back pain and joint pain.  Skin: Negative for itching and rash.  Neurological: Negative for dizziness, tremors, focal weakness, seizures and loss of consciousness.  Endo/Heme/Allergies: Positive for seasonal allergies.  Psychiatric/Behavioral: Negative for depression, suicidal ideas and hallucinations.  All other systems reviewed and are negative.   Physical Exam: Vitals:   07/20/17 1020  BP: 100/70  Pulse: 66   Body mass index is 34.31 kg/m. Gen:      No acute distress HEENT:  EOMI, sclera anicteric Neck:     No masses; no thyromegaly Lungs:    Clear to auscultation bilaterally; normal respiratory effort CV:         Regular rate and rhythm; no murmurs Abd:       + bowel sounds; soft, non-tender; no palpable masses, no distension Ext:    No edema; adequate peripheral perfusion Skin:      Warm and dry; no rash Neuro: alert and oriented x 3 Psych: normal mood and affect  Data Reviewed:  Reviewed labs, radiology imaging, old records and pertinent past GI work up   Assessment and Plan/Recommendations:  47 year old female with history of GERD, Candida esophagitis with complaints of recurrent odynophagia and dysphagia concerning for recurrent Candida esophagitis  Complete the 10-day course of fluconazole Repeat EGD in 2 weeks to evaluate and check healing, if continues to have persistent Candida esophagitis may need prolonged treatment for additional 2 to 4 weeks.  If has evidence of esophageal stricture and no active esophagitis will consider esophageal dilation. May also need to consider esophageal manometry to exclude dysmotility  The risks and benefits as well as alternatives of endoscopic procedure(s) have been discussed and reviewed. All questions answered. The patient agrees to proceed.   Greater than 50% of the time used for counseling as well as treatment plan and follow-up. She had multiple questions which were answered to her satisfaction  K. Denzil Magnuson , MD [phone]    CC: Larey Seat*

## 2017-07-20 NOTE — Patient Instructions (Signed)
You have been scheduled for an endoscopy. Please follow written instructions given to you at your visit today. If you use inhalers (even only as needed), please bring them with you on the day of your procedure. Your physician has requested that you go to www.startemmi.com and enter the access code given to you at your visit today. This web site gives a general overview about your procedure. However, you should still follow specific instructions given to you by our office regarding your preparation for the procedure.  Complete your course of fluconazole  If you are age 25 or older, your body mass index should be between 23-30. Your Body mass index is 34.31 kg/m. If this is out of the aforementioned range listed, please consider follow up with your Primary Care Provider.  If you are age 88 or younger, your body mass index should be between 19-25. Your Body mass index is 34.31 kg/m. If this is out of the aformentioned range listed, please consider follow up with your Primary Care Provider.

## 2017-07-28 ENCOUNTER — Encounter: Payer: Self-pay | Admitting: Gastroenterology

## 2017-07-29 ENCOUNTER — Encounter: Payer: Self-pay | Admitting: Gastroenterology

## 2017-07-29 ENCOUNTER — Other Ambulatory Visit: Payer: Self-pay

## 2017-07-29 ENCOUNTER — Ambulatory Visit (AMBULATORY_SURGERY_CENTER): Payer: Managed Care, Other (non HMO) | Admitting: Gastroenterology

## 2017-07-29 VITALS — BP 117/74 | HR 85 | Temp 98.9°F | Resp 17 | Ht 65.5 in | Wt 209.0 lb

## 2017-07-29 DIAGNOSIS — R1310 Dysphagia, unspecified: Secondary | ICD-10-CM

## 2017-07-29 MED ORDER — SODIUM CHLORIDE 0.9 % IV SOLN: 500.0000 mL | Freq: Once | INTRAVENOUS | Status: DC

## 2017-07-29 NOTE — Patient Instructions (Signed)
YOU HAD AN ENDOSCOPIC PROCEDURE TODAY AT [facility]:   Refer to the procedure report that was given to you for any specific questions about what was found during the examination.  If the procedure report does not answer your questions, please call your gastroenterologist to clarify.  If you requested that your care partner not be given the details of your procedure findings, then the procedure report has been included in a sealed envelope for you to review at your convenience later.  YOU SHOULD EXPECT: Some feelings of bloating in the abdomen. Passage of more gas than usual.  Walking can help get rid of the air that was put into your GI tract during the procedure and reduce the bloating. I  Please Note:  You might notice some irritation and congestion in your nose or some drainage.  This is from the oxygen used during your procedure.  There is no need for concern and it should clear up in a day or so.  SYMPTOMS TO REPORT IMMEDIATELY:    Following upper endoscopy (EGD)  Vomiting of blood or coffee ground material  New chest pain or pain under the shoulder blades  Painful or persistently difficult swallowing  New shortness of breath  Fever of 100F or higher  Black, tarry-looking stools  For urgent or emergent issues, a gastroenterologist can be reached at any hour by calling [phone].   DIET:  We do recommend a small meal at first, but then you may proceed to your regular diet.  Drink plenty of fluids but you should avoid alcoholic beverages for 24 hours.  ACTIVITY:  You should plan to take it easy for the rest of today and you should NOT DRIVE or use heavy machinery until tomorrow (because of the sedation medicines used during the test).    FOLLOW UP: Our staff will call the number listed on your records the next business day following your procedure to check on you and address any questions or concerns that you may have regarding the information given to you  following your procedure. If we do not reach you, we will leave a message.  However, if you are feeling well and you are not experiencing any problems, there is no need to return our call.  We will assume that you have returned to your regular daily activities without incident.  If any biopsies were taken you will be contacted by phone or by letter within the next 1-3 weeks.  Please call us at [phone] if you have not heard about the biopsies in 3 weeks.    SIGNATURES/CONFIDENTIALITY: You and/or your care partner have signed paperwork which will be entered into your electronic medical record.  These signatures attest to the fact that that the information above on your After Visit Summary has been reviewed and is understood.  Full responsibility of the confidentiality of this discharge information lies with you and/or your care-partner.

## 2017-07-29 NOTE — Progress Notes (Signed)
I have reviewed the patient's medical history in detail and updated the computerized patient record.

## 2017-07-29 NOTE — Progress Notes (Signed)
Called to room to assist during endoscopic procedure.  Patient ID and intended procedure confirmed with present staff. Received instructions for my participation in the procedure from the performing physician.  

## 2017-07-29 NOTE — Progress Notes (Signed)
To recovery, report to RN, VSS. 

## 2017-07-29 NOTE — Op Note (Signed)
[facility] Patient Name: Tamara Watson Procedure Date: 07/29/2017 10:10 AM MRN: [id number] Endoscopist: Mauri Pole , MD Age: 47 Referring MD:  Date of Birth: [birth year] Gender: Female Account #: [id number] Procedure:                Upper GI endoscopy Indications:              Dysphagia, Follow-up of recurrent candida                            esophagitis Medicines:                Monitored Anesthesia Care Procedure:                Pre-Anesthesia Assessment:                           - Prior to the procedure, a History and Physical                            was performed, and patient medications and                            allergies were reviewed. The patient's tolerance of                            previous anesthesia was also reviewed. The risks                            and benefits of the procedure and the sedation                            options and risks were discussed with the patient.                            All questions were answered, and informed consent                            was obtained. Prior Anticoagulants: The patient has                            taken no previous anticoagulant or antiplatelet                            agents. ASA Grade Assessment: II - A patient with                            mild systemic disease. After reviewing the risks                            and benefits, the patient was deemed in                            satisfactory condition to undergo the procedure.  After obtaining informed consent, the endoscope was                            passed under direct vision. Throughout the                            procedure, the patient's blood pressure, pulse, and                            oxygen saturations were monitored continuously. The                            Model GIF-HQ190 [id number]) scope was introduced                            through the mouth, and advanced to  the second part                            of duodenum. The upper GI endoscopy was                            accomplished without difficulty. The patient                            tolerated the procedure well. Stomach retroflexion                            photo not saved in error. Scope In: Scope Out: Findings:                 The Z-line was regular.                           No endoscopic abnormality was evident in the                            esophagus to explain the patient's complaint of                            dysphagia. No evidence of candida esophagitis.                            Biopsies were obtained from the proximal and distal                            esophagus with cold forceps for histology of                            suspected eosinophilic esophagitis.                           The stomach was normal.                           The examined duodenum was normal. Complications:  No immediate complications. Estimated Blood Loss:     Estimated blood loss was minimal. Impression:               - Z-line regular.                           - No endoscopic esophageal abnormality to explain                            patient's dysphagia. Biopsied.                           - Normal stomach.                           - Normal examined duodenum. Recommendation:           - Patient has a contact number available for                            emergencies. The signs and symptoms of potential                            delayed complications were discussed with the                            patient. Return to normal activities tomorrow.                            Written discharge instructions were provided to the                            patient.                           - Resume previous diet.                           - Continue present medications.                           - Await pathology results.                           - Perform routine esophageal  manometry at                            appointment to be scheduled. Mauri Pole, MD 07/29/2017 10:30:25 AM This report has been signed electronically.

## 2017-08-01 ENCOUNTER — Telehealth: Payer: Self-pay

## 2017-08-01 ENCOUNTER — Other Ambulatory Visit: Payer: Self-pay

## 2017-08-01 NOTE — Telephone Encounter (Signed)
  LMOM; will try again this afternoon Mylik Pro/ follow-up call [place]

## 2017-08-01 NOTE — Telephone Encounter (Signed)
Contacted the patient. Agrees to schedule the esophageal manometry.  Appt 08/17/17 at 10:30 am Tamara Watson. Details about the appointment mailed to the patient.

## 2017-08-05 ENCOUNTER — Encounter: Payer: Self-pay | Admitting: Gastroenterology

## 2017-08-15 ENCOUNTER — Telehealth: Payer: Self-pay | Admitting: Gastroenterology

## 2017-08-15 NOTE — Telephone Encounter (Signed)
Left message for the patient. Will try to reach her tomorrow before I cancel.

## 2017-08-16 NOTE — Telephone Encounter (Signed)
Left another message for the patient. Cancelled the esophageal manometry per her request.

## 2017-08-16 NOTE — Telephone Encounter (Signed)
ok 

## 2017-08-17 ENCOUNTER — Ambulatory Visit (HOSPITAL_COMMUNITY)
Admission: RE | Admit: 2017-08-17 | Payer: Managed Care, Other (non HMO) | Source: Ambulatory Visit | Admitting: Gastroenterology

## 2017-08-17 ENCOUNTER — Encounter (HOSPITAL_COMMUNITY): Admission: RE | Payer: Self-pay | Source: Ambulatory Visit

## 2017-08-17 SURGERY — MANOMETRY, ESOPHAGUS

## 2017-11-03 ENCOUNTER — Other Ambulatory Visit: Payer: Self-pay | Admitting: Gastroenterology

## 2018-01-09 ENCOUNTER — Ambulatory Visit (INDEPENDENT_AMBULATORY_CARE_PROVIDER_SITE_OTHER): Payer: Managed Care, Other (non HMO) | Admitting: Family Medicine

## 2018-01-09 ENCOUNTER — Encounter: Payer: Self-pay | Admitting: Family Medicine

## 2018-01-09 VITALS — BP 118/64 | HR 96 | Temp 98.1°F | Ht 65.0 in | Wt 209.0 lb

## 2018-01-09 DIAGNOSIS — J3089 Other allergic rhinitis: Secondary | ICD-10-CM

## 2018-01-09 DIAGNOSIS — Z0000 Encounter for general adult medical examination without abnormal findings: Secondary | ICD-10-CM | POA: Diagnosis not present

## 2018-01-09 DIAGNOSIS — K219 Gastro-esophageal reflux disease without esophagitis: Secondary | ICD-10-CM

## 2018-01-09 MED ORDER — BENZONATATE 100 MG PO CAPS: 100.0000 mg | ORAL_CAPSULE | Freq: Three times a day (TID) | ORAL | 0 refills | Status: DC | PRN

## 2018-01-09 MED ORDER — DEXLANSOPRAZOLE 60 MG PO CPDR: 60.0000 mg | DELAYED_RELEASE_CAPSULE | Freq: Every day | ORAL | 11 refills | Status: DC

## 2018-01-09 NOTE — Progress Notes (Addendum)
   Subjective   Patient ID: Tamara Watson    DOB: 03-02-1970, 47 y.o. female   MRN: [id number]  CC: "Physical exam"  HPI: Tamara Watson is a 47 y.o. female who presents to clinic today for the following:  Physical exam: Patient here today for annual physical exam.  She continues to have an occasional cough which occurs usually during the daytime.  She has had extensive work-up with her GI doctor regarding her GERD and recently started Allegra approximately 1 month ago with some improvement.  She is a non-smoker.  She is due for her Pap smear but is scheduled to see her OB/GYN for her routine care along with her known adenomyosis which has contributed to her chronic microcytic anemia.  Chronic cough: Patient currently on Dexilant for GERD with significant improvement over the last year.  She was previously treated for H. pylori and had a subsequent fungal infection which was also treated.  Patient has received endoscopy and was scheduled to follow-up with a esophageal manometry but was canceled back in July due to patient's request.  She continues to have an occasional cough but denies any dysphagia regarding solids or liquids.  Patient has no history of weight loss.  ROS: see HPI for pertinent.  [place]: Dysphasia, GERD, adenomyosis, allergies.  Surgical history D&C, breast lumpectomy, tubal.  Family history HLD, DM.  Smoking status reviewed. Medications reviewed.  Objective   BP 118/64 (BP Location: Right Arm, Patient Position: Sitting, Cuff Size: Normal)   Pulse 96   Temp 98.1 F (36.7 C) (Oral)   Ht 5\' 5"  (1.651 m)   Wt 209 lb (94.8 kg)   SpO2 98%   BMI 34.78 kg/m  Vitals and nursing note reviewed.  General: well nourished, well developed, NAD with non-toxic appearance HEENT: normocephalic, atraumatic, moist mucous membranes, no signs of postnasal drip or pharyngeal erythema Neck: supple, non-tender without lymphadenopathy Cardiovascular: regular rate and rhythm  without murmurs, rubs, or gallops Lungs: clear to auscultation bilaterally with normal work of breathing Abdomen: soft, non-tender, non-distended, normoactive bowel sounds Skin: warm, dry, no rashes or lesions, cap refill < 2 seconds Extremities: warm and well perfused, normal tone, no edema  Assessment & Plan   GERD (gastroesophageal reflux disease) Chronic.  Has history of extensive work-up with GI.  Symptoms seem to be improved with PPI. - Given refill for Dexilant 60 mg daily with directions to follow-up with GI to reschedule esophageal manometry  Encounter for annual physical exam Due for Pap smear.  Up-to-date on vaccinations.  Non-smoker. - Instructed to follow-up with gynecologist for Pap smear - Discussed lifestyle modifications including diet and exercise - Checking CBC and BMET - Given prescription for Tessalon Perles per patient request for chronic cough  Allergic rhinitis Chronic.  Improved with Allegra. - Continue antihistamine given known chronic cough  Orders Placed This Encounter  Procedures  . CBC  . Basic metabolic panel   Meds ordered this encounter  Medications  . benzonatate (TESSALON) 100 MG capsule    Sig: Take 1 capsule (100 mg total) by mouth 3 (three) times daily as needed for cough.    Dispense:  30 capsule    Refill:  0  . dexlansoprazole (DEXILANT) 60 MG capsule    Sig: Take 1 capsule (60 mg total) by mouth daily before breakfast.    Dispense:  3 capsule    Refill:  [place], DO [place], PGY-3 01/09/2018, 5:35 PM

## 2018-01-09 NOTE — Assessment & Plan Note (Addendum)
Due for Pap smear.  Up-to-date on vaccinations.  Non-smoker. - Instructed to follow-up with gynecologist for Pap smear - Discussed lifestyle modifications including diet and exercise - Checking CBC and BMET - Given prescription for Tessalon Perles per patient request for chronic cough

## 2018-01-09 NOTE — Addendum Note (Signed)
Addended byYisroel Ramming, DAVID J on: 01/09/2018 05:35 PM   Modules accepted: Orders

## 2018-01-09 NOTE — Assessment & Plan Note (Addendum)
Chronic.  Has history of extensive work-up with GI.  Symptoms seem to be improved with PPI. - Given refill for Dexilant 60 mg daily with directions to follow-up with GI to reschedule esophageal manometry

## 2018-01-09 NOTE — Patient Instructions (Signed)
Thank you for coming in to see [place] today. Please see below to review our plan for today's visit.  Call your GI doctor to schedule an appointment to follow-up regarding the esophageal manometry.  If you continue having symptoms, he may benefit from further work-up.  I did send in a refill for your Ladona Ridgel which you can take as needed for your coughing flares.  Continue taking the Allegra in addition to your Dexilant.  I will check your blood work today and call you if there is anything abnormal.  We should expect you to be anemic with your known adenomyosis.  Follow-up with your OB/GYN regarding this and get your Pap smear done.  Please call the clinic at [phone] if your symptoms worsen or you have any concerns. It was our pleasure to serve you.  Harriet Butte, [place], PGY-3

## 2018-01-09 NOTE — Assessment & Plan Note (Signed)
Chronic.  Improved with Allegra. - Continue antihistamine given known chronic cough

## 2018-01-10 ENCOUNTER — Telehealth: Payer: Self-pay | Admitting: Family Medicine

## 2018-01-10 LAB — BASIC METABOLIC PANEL
BUN / CREAT RATIO: 10 (ref 9–23)
BUN: 10 mg/dL (ref 6–24)
CHLORIDE: 102 mmol/L (ref 96–106)
CO2: 20 mmol/L (ref 20–29)
Calcium: 8.8 mg/dL (ref 8.7–10.2)
Creatinine, Ser: 0.98 mg/dL (ref 0.57–1.00)
GFR calc non Af Amer: 70 mL/min/{1.73_m2} (ref >59)
GFR, EST AFRICAN AMERICAN: 81 mL/min/{1.73_m2} (ref >59)
Glucose: 95 mg/dL (ref 65–99)
Potassium: 4.1 mmol/L (ref 3.5–5.2)
SODIUM: 136 mmol/L (ref 134–144)

## 2018-01-10 LAB — CBC
HEMOGLOBIN: 7.2 g/dL — AB (ref 11.1–15.9)
Hematocrit: 27.1 % — ABNORMAL LOW (ref 34.0–46.6)
MCH: 16 pg — ABNORMAL LOW (ref 26.6–33.0)
MCHC: 26.6 g/dL — AB (ref 31.5–35.7)
MCV: 60 fL — AB (ref 79–97)
PLATELETS: 315 10*3/uL (ref 150–450)
RBC: 4.51 x10E6/uL (ref 3.77–5.28)
RDW: 20.2 % — AB (ref 12.3–15.4)
WBC: 4.3 10*3/uL (ref 3.4–10.8)

## 2018-01-10 NOTE — Telephone Encounter (Signed)
Stated to contact patient regarding worsening chronic anemia based on CBC from visit on 01/09/2018.  Patient did report diagnosis for adenomyosis and endorsed follow-up with her OB/GYN.  Unfortunately, I do not see any follow-up regarding this since 2013.  Left voicemail requesting patient return called the clinic discuss when follow-up is planned regarding this issue.  Patient did not report symptomatic anemia during her visit.  Would benefit from iron supplementation in the interim until issue is resolved.  Will forward to team.  Harriet [place], [place], PGY-3

## 2018-01-10 NOTE — Telephone Encounter (Signed)
Pt returned call to clinic- I attempted to call her to discuss, I had to leave VM.

## 2018-01-20 ENCOUNTER — Ambulatory Visit (HOSPITAL_COMMUNITY): Admission: RE | Admit: 2018-01-20 | Payer: Managed Care, Other (non HMO) | Source: Ambulatory Visit

## 2018-01-20 ENCOUNTER — Other Ambulatory Visit: Payer: Self-pay | Admitting: Internal Medicine

## 2018-01-20 ENCOUNTER — Other Ambulatory Visit (HOSPITAL_COMMUNITY): Payer: Self-pay | Admitting: Internal Medicine

## 2018-01-20 ENCOUNTER — Encounter (HOSPITAL_COMMUNITY): Payer: Self-pay

## 2018-01-20 DIAGNOSIS — R51 Headache: Principal | ICD-10-CM

## 2018-01-20 DIAGNOSIS — R519 Headache, unspecified: Secondary | ICD-10-CM

## 2018-01-23 ENCOUNTER — Other Ambulatory Visit: Payer: Self-pay | Admitting: Gastroenterology

## 2018-03-31 ENCOUNTER — Encounter: Payer: Self-pay | Admitting: Internal Medicine

## 2018-03-31 ENCOUNTER — Telehealth: Payer: Self-pay | Admitting: Hematology

## 2018-03-31 ENCOUNTER — Encounter: Payer: Self-pay | Admitting: Hematology

## 2018-03-31 NOTE — Telephone Encounter (Signed)
Has been scheduled to see Dr. Irene Limbo on 3/25 at 1pm. Pt aware to arrive 30 minutes early. She was originally scheduled on 3/12 w/Dr. Walden Field but had to reschedule. Letter mailed.

## 2018-04-13 ENCOUNTER — Encounter: Payer: Self-pay | Admitting: Internal Medicine

## 2018-04-20 ENCOUNTER — Other Ambulatory Visit: Payer: Self-pay | Admitting: Physician Assistant

## 2018-04-20 DIAGNOSIS — K219 Gastro-esophageal reflux disease without esophagitis: Secondary | ICD-10-CM

## 2018-04-26 ENCOUNTER — Telehealth: Payer: Self-pay | Admitting: Hematology

## 2018-04-26 ENCOUNTER — Encounter: Payer: Self-pay | Admitting: Hematology

## 2018-04-26 NOTE — Telephone Encounter (Signed)
Patient has been rescheduled to see Dr. Irene Limbo on 4/22 at 1pm.

## 2018-05-24 ENCOUNTER — Inpatient Hospital Stay: Payer: Managed Care, Other (non HMO) | Attending: Internal Medicine | Admitting: Hematology

## 2018-05-24 ENCOUNTER — Other Ambulatory Visit: Payer: Self-pay

## 2018-05-24 ENCOUNTER — Telehealth: Payer: Self-pay | Admitting: Hematology

## 2018-05-24 ENCOUNTER — Inpatient Hospital Stay: Payer: Managed Care, Other (non HMO)

## 2018-05-24 VITALS — BP 127/79 | HR 84 | Temp 97.9°F | Resp 18 | Ht 65.0 in | Wt 214.0 lb

## 2018-05-24 DIAGNOSIS — N920 Excessive and frequent menstruation with regular cycle: Secondary | ICD-10-CM | POA: Diagnosis not present

## 2018-05-24 DIAGNOSIS — D259 Leiomyoma of uterus, unspecified: Secondary | ICD-10-CM | POA: Insufficient documentation

## 2018-05-24 DIAGNOSIS — Z803 Family history of malignant neoplasm of breast: Secondary | ICD-10-CM | POA: Insufficient documentation

## 2018-05-24 DIAGNOSIS — Z809 Family history of malignant neoplasm, unspecified: Secondary | ICD-10-CM | POA: Diagnosis not present

## 2018-05-24 DIAGNOSIS — D500 Iron deficiency anemia secondary to blood loss (chronic): Secondary | ICD-10-CM | POA: Insufficient documentation

## 2018-05-24 DIAGNOSIS — Z8041 Family history of malignant neoplasm of ovary: Secondary | ICD-10-CM | POA: Diagnosis not present

## 2018-05-24 DIAGNOSIS — D509 Iron deficiency anemia, unspecified: Secondary | ICD-10-CM | POA: Insufficient documentation

## 2018-05-24 DIAGNOSIS — Z808 Family history of malignant neoplasm of other organs or systems: Secondary | ICD-10-CM | POA: Diagnosis not present

## 2018-05-24 LAB — CMP (CANCER CENTER ONLY)
ALT: 13 U/L (ref 0–44)
AST: 18 U/L (ref 15–41)
Albumin: 3.8 g/dL (ref 3.5–5.0)
Alkaline Phosphatase: 63 U/L (ref 38–126)
Anion gap: 6 (ref 5–15)
BUN: 9 mg/dL (ref 6–20)
CO2: 27 mmol/L (ref 22–32)
Calcium: 9.1 mg/dL (ref 8.9–10.3)
Chloride: 106 mmol/L (ref 98–111)
Creatinine: 0.82 mg/dL (ref 0.44–1.00)
GFR, Est AFR Am: >60 mL/min (ref >60)
GFR, Estimated: >60 mL/min (ref >60)
Glucose, Bld: 72 mg/dL (ref 70–99)
Potassium: 4.3 mmol/L (ref 3.5–5.1)
Sodium: 139 mmol/L (ref 135–145)
Total Bilirubin: 0.3 mg/dL (ref 0.3–1.2)
Total Protein: 7.9 g/dL (ref 6.5–8.1)

## 2018-05-24 LAB — IRON AND TIBC
Iron: 20 ug/dL — ABNORMAL LOW (ref 41–142)
Saturation Ratios: 4 % — ABNORMAL LOW (ref 21–57)
TIBC: 444 ug/dL (ref 236–444)
UIBC: 424 ug/dL — ABNORMAL HIGH (ref 120–384)

## 2018-05-24 LAB — CBC WITH DIFFERENTIAL/PLATELET
Abs Immature Granulocytes: 0.02 10*3/uL (ref 0.00–0.07)
Basophils Absolute: 0 10*3/uL (ref 0.0–0.1)
Basophils Relative: 1 %
Eosinophils Absolute: 0 10*3/uL (ref 0.0–0.5)
Eosinophils Relative: 1 %
HCT: 40.5 % (ref 36.0–46.0)
Hemoglobin: 11.4 g/dL — ABNORMAL LOW (ref 12.0–15.0)
Immature Granulocytes: 0 %
Lymphocytes Relative: 49 %
Lymphs Abs: 2.4 10*3/uL (ref 0.7–4.0)
MCH: 20.8 pg — ABNORMAL LOW (ref 26.0–34.0)
MCHC: 28.1 g/dL — ABNORMAL LOW (ref 30.0–36.0)
MCV: 74 fL — ABNORMAL LOW (ref 80.0–100.0)
Monocytes Absolute: 0.5 10*3/uL (ref 0.1–1.0)
Monocytes Relative: 11 %
Neutro Abs: 1.9 10*3/uL (ref 1.7–7.7)
Neutrophils Relative %: 38 %
Platelets: 245 10*3/uL (ref 150–400)
RBC: 5.47 MIL/uL — ABNORMAL HIGH (ref 3.87–5.11)
RDW: 25.8 % — ABNORMAL HIGH (ref 11.5–15.5)
WBC: 4.9 10*3/uL (ref 4.0–10.5)
nRBC: 0 % (ref 0.0–0.2)

## 2018-05-24 LAB — FERRITIN: Ferritin: 8 ng/mL — ABNORMAL LOW (ref 11–307)

## 2018-05-24 LAB — SAMPLE TO BLOOD BANK

## 2018-05-24 LAB — VITAMIN B12: Vitamin B-12: 624 pg/mL (ref 180–914)

## 2018-05-24 NOTE — Progress Notes (Signed)
HEMATOLOGY/ONCOLOGY CONSULTATION NOTE  Date of Service: 05/24/2018  Patient Care Team: Rutledge Bing, DO as PCP - General (Family Medicine)  Dr. Domenick Gong at [facility] as PCP  CHIEF COMPLAINTS/PURPOSE OF CONSULTATION:  Iron Deficiency Anemia  HISTORY OF PRESENTING ILLNESS:   Tamara Watson is a wonderful 48 y.o. female who has been referred to [place] by Dr. Domenick Gong at [facility] for evaluation and management of Iron Deficiency Anemia. The pt reports that she is doing well overall.   The pt reports that she has had anemia for more than 5 years, which she notes is believed to be related to her heavy periods. She notes that she has fibroids and adenomyosis, has had two D&Cs, her last was in 1997. She has 2 children, notes her youngest child is 75, oldest is 29. The pt endorses heavy periods for the first 3 days, and her periods are regular. She notes that her only option is a hysterectomy to resolve her adenomyosis. She notes that she sees Dr. Tommi Emery at [facility].  The pt notes that her baseline HGB has been between 8 and 9 for the last 5 years, but normalized at one point to 12 after iron supplements. She has tried PO iron supplements which have been difficult for her to tolerate. She is currently taking Fusion Plus which she tolerates better, but notes she hasn't taken this consistently. The pt denies ever receiving blood transfusion nor IV iron.  The pt notes that she has been feeling light headed, some SOB, some muscle aches into her legs and buttocks.   The pt notes that she has taken [place] for the last year.   Most recent lab results (03/14/18) of CBC is as follows: all values are WNL except for WBC at 4.00k, HGB at 7.1, HCT at 26.6%, MCV at 59.0, MCH at 15.8, MCHC at 26.7, RDW at 15.6. 03/14/18 Iron Panel revealed TIBC at 469, UIBC at 457, Iron at 12 and a 3% Saturation Ratio. 03/14/18 Ferritin at <2.5  On review  of systems, pt reports heavy periods, light headedness, muscle aches, some SOB, lower abdominal tenderness, and denies blood in the stools, black stools, gum bleeds, nose bleeds, abdominal pain, back pain, and any other symptoms.   On PMHx the pt reports Uterine fibroids, Iron deficiency anemia. 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 Denies medication allergies or food or environmental allergies.   MEDICAL HISTORY:  Past Medical History:  Diagnosis Date  . Allergy   . Anemia   . Anxiety    in school only   . Constipation    sennocot and miralax   . GERD (gastroesophageal reflux disease)     SURGICAL HISTORY: Past Surgical History:  Procedure Laterality Date  . BREAST LUMPECTOMY     20 yrs ago- benign   . DILATION AND CURETTAGE OF UTERUS     x2  . NSVD     x3  . TUBAL LIGATION      SOCIAL HISTORY: Social History   Socioeconomic History  . Marital status: Married    Spouse name: Not on file  . Number of children: 3  . Years of education: Not on file  . Highest education level: Not on file  Occupational History  . Not on file  Social Needs  . Financial resource strain: Not on file  . Food insecurity:    Worry: Not on file    Inability: Not on  file  . Transportation needs:    Medical: Not on file    Non-medical: Not on file  Tobacco Use  . Smoking status: Never Smoker  . Smokeless tobacco: Never Used  Substance and Sexual Activity  . Alcohol use: No    Frequency: Never    Comment: stopped drinking mid 1-19  . Drug use: No  . Sexual activity: Yes  Lifestyle  . Physical activity:    Days per week: Not on file    Minutes per session: Not on file  . Stress: Not on file  Relationships  . Social connections:    Talks on phone: Not on file    Gets together: Not on file    Attends religious service: Not on file    Active member of club or organization: Not on file    Attends meetings of clubs or organizations: Not on file    Relationship status: Not  on file  . Intimate partner violence:    Fear of current or ex partner: Not on file    Emotionally abused: Not on file    Physically abused: Not on file    Forced sexual activity: Not on file  Other Topics Concern  . Not on file  Social History Narrative  . Not on file    FAMILY HISTORY: Family History  Problem Relation Age of Onset  . Cancer Maternal Aunt   . Ovarian cancer Maternal Aunt   . Cancer Paternal Grandmother   . Pancreatic cancer Paternal Grandmother   . Hyperlipidemia Mother   . Diabetes Mother   . Hyperlipidemia Father   . Breast cancer Maternal Aunt   . Colon cancer Neg Hx   . Esophageal cancer Neg Hx   . Stomach cancer Neg Hx   . Rectal cancer Neg Hx   . Colon polyps Neg Hx     ALLERGIES:  has No Known Allergies.  MEDICATIONS:  Current Outpatient Medications  Medication Sig Dispense Refill  . benzonatate (TESSALON) 100 MG capsule Take 1 capsule (100 mg total) by mouth 3 (three) times daily as needed for cough. 30 capsule 0  . DEXILANT 60 MG capsule TAKE 1 CAPSULE BY MOUTH DAILY BEFORE BREAKFAST. 90 capsule 0  . fluconazole (DIFLUCAN) 100 MG tablet Take 1 tablet (100 mg total) by mouth daily. 10 tablet 0  . fluticasone (FLONASE) 50 MCG/ACT nasal spray Place 2 sprays into both nostrils daily. 16 g 1  . ibuprofen (ADVIL,MOTRIN) 200 MG tablet Take 600 mg by mouth every 6 (six) hours as needed for mild pain.    Marland Kitchen LINZESS 72 MCG capsule TAKE 1 CAPSULE (72 MCG TOTAL) BY MOUTH DAILY BEFORE BREAKFAST. 90 capsule 2   No current facility-administered medications for this visit.     REVIEW OF SYSTEMS:    10 Point review of Systems was done is negative except as noted above.  PHYSICAL EXAMINATION:  . Vitals:   05/24/18 1325  BP: 127/79  Pulse: 84  Resp: 18  Temp: 97.9 F (36.6 C)  SpO2: 100%   Filed Weights   05/24/18 1325  Weight: 214 lb (97.1 kg)   .Body mass index is 35.61 kg/m.  GENERAL:alert, in no acute distress and comfortable SKIN: no  acute rashes, no significant lesions EYES: conjunctiva are pink and non-injected, sclera anicteric OROPHARYNX: MMM, no exudates, no oropharyngeal erythema or ulceration NECK: supple, no JVD LYMPH:  no palpable lymphadenopathy in the cervical, axillary or inguinal regions LUNGS: clear to auscultation b/l with normal respiratory  effort HEART: regular rate & rhythm ABDOMEN:  normoactive bowel sounds , non tender, not distended. Extremity: no pedal edema PSYCH: alert & oriented x 3 with fluent speech NEURO: no focal motor/sensory deficits  LABORATORY DATA:  I have reviewed the data as listed  . CBC Latest Ref Rng & Units 01/09/2018 11/08/2016 07/21/2016  WBC 3.4 - 10.8 x10E3/uL 4.3 3.7 5.6  Hemoglobin 11.1 - 15.9 g/dL 7.2(L) 8.6(L) 9.6(L)  Hematocrit 34.0 - 46.6 % 27.1(L) 29.5(L) 31.5(L)  Platelets 150 - 450 x10E3/uL 315 256 290.0    . CMP Latest Ref Rng & Units 01/09/2018 07/21/2016 03/01/2016  Glucose 65 - 99 mg/dL 95 94 98  BUN 6 - 24 mg/dL 10 7 10   Creatinine 0.57 - 1.00 mg/dL 0.98 0.76 0.90  Sodium 134 - 144 mmol/L 136 138 139  Potassium 3.5 - 5.2 mmol/L 4.1 3.5 4.0  Chloride 96 - 106 mmol/L 102 103 107  CO2 20 - 29 mmol/L 20 28 25   Calcium 8.7 - 10.2 mg/dL 8.8 9.3 8.7  Total Protein 6.0 - 8.3 g/dL - 7.1 6.9  Total Bilirubin 0.2 - 1.2 mg/dL - 0.4 0.3  Alkaline Phos 39 - 117 U/L - 46 41  AST 0 - 37 U/L - 12 12  ALT 0 - 35 U/L - 7 10    03/14/18 CBC, Ferritin, Iron Panel:      RADIOGRAPHIC STUDIES: I have personally reviewed the radiological images as listed and agreed with the findings in the report. No results found.  ASSESSMENT & PLAN:  48 y.o. female with  1. Iron deficiency anemia - likely due to heavy menstrual losses PLAN -Discussed patient's most recent labs from 03/14/18, HGB at 7.1, Ferritin <2.5, a 3% Saturation ratio -Iron deficiency anemia attributable to menorrhagia  -Dexilant can also cause iron and vitamin B12 deficiency -Discussed that pt could  benefit from IV Iron replacement which would offer much quicker symptomatic relief than PO replacement alone and to avoid needing a blood transfusion -Recommend discussing further options to control menorrhagia with OBGYN: lupron vs endometrial ablation vs hysterectomy. -Will set pt up for IV Injectafer x weekly, for 2 doses. Informed consent obtained. Recommend OTC Claritin and Tylenol 30 minutes prior to infusion. -Recommend PO 150mg  Iron polysaccharide daily for maintenance -Recommend Vitamin B complex daily to support accelerated hematopoiesis -Will order labs today -Will see the pt back in 2 months    Labs today IV Injectafer weekly x 2 doses to start ASAP within 5 days RTC with Dr Irene Limbo with labs in 2 months   All of the patients questions were answered with apparent satisfaction. The patient knows to call the clinic with any problems, questions or concerns.  The total time spent in the appt was 45 minutes and more than 50% was on counseling and direct patient cares.    Sullivan Lone MD MS AAHIVMS [facility] Hematology/Oncology Physician [facility]  (Office):       [phone] (Work cell):  [phone] (Fax):           [phone]  05/24/2018 2:10 PM  I, Baldwin Jamaica, am acting as a scribe for Dr. Sullivan Lone.   .I have reviewed the above documentation for accuracy and completeness, and I agree with the above. Brunetta Genera MD

## 2018-05-24 NOTE — Telephone Encounter (Signed)
Called and scheduled appt per 4/22 los.  Patient aware of scheduled appts.

## 2018-05-25 ENCOUNTER — Inpatient Hospital Stay: Payer: Managed Care, Other (non HMO)

## 2018-05-25 VITALS — BP 138/81 | HR 75 | Temp 98.8°F | Resp 16

## 2018-05-25 DIAGNOSIS — D500 Iron deficiency anemia secondary to blood loss (chronic): Secondary | ICD-10-CM

## 2018-05-25 DIAGNOSIS — D509 Iron deficiency anemia, unspecified: Secondary | ICD-10-CM | POA: Diagnosis not present

## 2018-05-25 MED ORDER — SODIUM CHLORIDE 0.9 % IV SOLN
750.0000 mg | Freq: Once | INTRAVENOUS | Status: AC
  Administered 2018-05-25: 750 mg via INTRAVENOUS
  Filled 2018-05-25: qty 15

## 2018-05-25 MED ORDER — SODIUM CHLORIDE 0.9 % IV SOLN
Freq: Once | INTRAVENOUS | Status: AC
  Administered 2018-05-25: 16:00:00 via INTRAVENOUS
  Filled 2018-05-25: qty 250

## 2018-05-25 NOTE — Patient Instructions (Addendum)
Ferric carboxymaltose injection What is this medicine? FERRIC CARBOXYMALTOSE (ferr-ik car-box-ee-mol-toes) is an iron complex. Iron is used to make healthy red blood cells, which carry oxygen and nutrients throughout the body. This medicine is used to treat anemia in people with chronic kidney disease or people who cannot take iron by mouth. This medicine may be used for other purposes; ask your health care provider or pharmacist if you have questions. COMMON BRAND NAME(S): Injectafer What should I tell my health care provider before I take this medicine? They need to know if you have any of these conditions: -high levels of iron in the blood -liver disease -an unusual or allergic reaction to iron, other medicines, foods, dyes, or preservatives -pregnant or trying to get pregnant -breast-feeding How should I use this medicine? This medicine is for infusion into a vein. It is given by a health care professional in a hospital or clinic setting. Talk to your pediatrician regarding the use of this medicine in children. Special care may be needed. Overdosage: If you think you have taken too much of this medicine contact a poison control center or emergency room at once. NOTE: This medicine is only for you. Do not share this medicine with others. What if I miss a dose? It is important not to miss your dose. Call your doctor or health care professional if you are unable to keep an appointment. What may interact with this medicine? Do not take this medicine with any of the following medications: -deferoxamine -dimercaprol -other iron products This list may not describe all possible interactions. Give your health care provider a list of all the medicines, herbs, non-prescription drugs, or dietary supplements you use. Also tell them if you smoke, drink alcohol, or use illegal drugs. Some items may interact with your medicine. What should I watch for while using this medicine? Visit your doctor or  health care professional regularly. Tell your doctor if your symptoms do not start to get better or if they get worse. You may need blood work done while you are taking this medicine. You may need to follow a special diet. Talk to your doctor. Foods that contain iron include: whole grains/cereals, dried fruits, beans, or peas, leafy green vegetables, and organ meats (liver, kidney). What side effects may I notice from receiving this medicine? Side effects that you should report to your doctor or health care professional as soon as possible: -allergic reactions like skin rash, itching or hives, swelling of the face, lips, or tongue -dizziness -facial flushing Side effects that usually do not require medical attention (report to your doctor or health care professional if they continue or are bothersome): -changes in taste -constipation -headache -nausea, vomiting -pain, redness, or irritation at site where injected This list may not describe all possible side effects. Call your doctor for medical advice about side effects. You may report side effects to FDA at 1-800-FDA-1088. Where should I keep my medicine? This drug is given in a hospital or clinic and will not be stored at home. NOTE: This sheet is a summary. It may not cover all possible information. If you have questions about this medicine, talk to your doctor, pharmacist, or health care provider.  2019 Elsevier/Gold Standard (2016-03-04 09:40:29)   Coronavirus (COVID-19) Are you at risk?  Are you at risk for the Coronavirus (COVID-19)?  To be considered HIGH RISK for Coronavirus (COVID-19), you have to meet the following criteria:  . Traveled to [place], [place], [place], [place] or [place]; or in the  [place] States to [place], [place], [place], or [place]; and have fever, cough, and shortness of breath within the last 2 weeks of travel OR . Been in close contact with a person diagnosed with COVID-19 within the last 2 weeks and  have fever, cough, and shortness of breath . IF YOU DO NOT MEET THESE CRITERIA, YOU ARE CONSIDERED LOW RISK FOR COVID-19.  What to do if you are HIGH RISK for COVID-19?  Marland Kitchen If you are having a medical emergency, call 911. . Seek medical care right away. Before you go to a doctor's office, urgent care or emergency department, call ahead and tell them about your recent travel, contact with someone diagnosed with COVID-19, and your symptoms. You should receive instructions from your physician's office regarding next steps of care.  . When you arrive at healthcare provider, tell the healthcare staff immediately you have returned from visiting [place], [place], [place], [place] or [place]; or traveled in the [place] to [place], [place], [place], or [place]; in the last two weeks or you have been in close contact with a person diagnosed with COVID-19 in the last 2 weeks.   . Tell the health care staff about your symptoms: fever, cough and shortness of breath. . After you have been seen by a medical provider, you will be either: o Tested for (COVID-19) and discharged home on quarantine except to seek medical care if symptoms worsen, and asked to  - Stay home and avoid contact with others until you get your results (4-5 days)  - Avoid travel on public transportation if possible (such as bus, train, or airplane) or o Sent to the Emergency Department by EMS for evaluation, COVID-19 testing, and possible admission depending on your condition and test results.  What to do if you are LOW RISK for COVID-19?  Reduce your risk of any infection by using the same precautions used for avoiding the common cold or flu:  Marland Kitchen Wash your hands often with soap and warm water for at least 20 seconds.  If soap and water are not readily available, use an alcohol-based hand sanitizer with at least 60% alcohol.  . If coughing or sneezing, cover your mouth and nose by coughing or sneezing into the elbow areas of your  shirt or coat, into a tissue or into your sleeve (not your hands). . Avoid shaking hands with others and consider head nods or verbal greetings only. . Avoid touching your eyes, nose, or mouth with unwashed hands.  . Avoid close contact with people who are sick. . Avoid places or events with large numbers of people in one location, like concerts or sporting events. . Carefully consider travel plans you have or are making. . If you are planning any travel outside or inside the [place], visit the CDC's Travelers' Health webpage for the latest health notices. . If you have some symptoms but not all symptoms, continue to monitor at home and seek medical attention if your symptoms worsen. . If you are having a medical emergency, call 911.   [place] / e-Visit: [email or web address]         [facility]: [facility]: [phone]                   [facility]: [phone]

## 2018-05-26 ENCOUNTER — Ambulatory Visit: Payer: Self-pay

## 2018-06-01 ENCOUNTER — Inpatient Hospital Stay: Payer: Managed Care, Other (non HMO)

## 2018-06-02 ENCOUNTER — Ambulatory Visit: Payer: Self-pay

## 2018-06-08 ENCOUNTER — Encounter (HOSPITAL_COMMUNITY): Payer: Self-pay | Admitting: *Deleted

## 2018-06-08 ENCOUNTER — Other Ambulatory Visit: Payer: Self-pay | Admitting: Obstetrics & Gynecology

## 2018-06-08 ENCOUNTER — Telehealth: Payer: Self-pay | Admitting: *Deleted

## 2018-06-08 NOTE — Telephone Encounter (Signed)
Patient called to ask about appointment she thought she had scheduled for an Iron infusion today. Advised her that her second iron infusion was scheduled for 4/30. She apologized and said she had the date wrong. She asked to come in before 5/13 to have it (having surgery) if ok with Dr. Irene Limbo. Dr. Irene Limbo in agreement for her to receive 2nd unit before 5/13. Attempted to contact patient - LVM for patient with this information and to inform that scheduling will contact her. Sent schedule msg.

## 2018-06-12 ENCOUNTER — Other Ambulatory Visit: Payer: Self-pay

## 2018-06-12 ENCOUNTER — Other Ambulatory Visit (HOSPITAL_COMMUNITY)
Admission: RE | Admit: 2018-06-12 | Discharge: 2018-06-12 | Disposition: A | Discharge status: Home / Self Care | Payer: Managed Care, Other (non HMO) | Source: Ambulatory Visit | Attending: Obstetrics & Gynecology | Admitting: Obstetrics & Gynecology

## 2018-06-12 ENCOUNTER — Inpatient Hospital Stay: Payer: Managed Care, Other (non HMO) | Attending: Internal Medicine

## 2018-06-12 VITALS — BP 126/77 | HR 67 | Temp 97.7°F | Resp 16

## 2018-06-12 DIAGNOSIS — Z1159 Encounter for screening for other viral diseases: Secondary | ICD-10-CM | POA: Diagnosis present

## 2018-06-12 DIAGNOSIS — D500 Iron deficiency anemia secondary to blood loss (chronic): Secondary | ICD-10-CM

## 2018-06-12 MED ORDER — SODIUM CHLORIDE 0.9 % IV SOLN
Freq: Once | INTRAVENOUS | Status: AC
  Administered 2018-06-12: 09:00:00 via INTRAVENOUS
  Filled 2018-06-12: qty 250

## 2018-06-12 MED ORDER — SODIUM CHLORIDE 0.9 % IV SOLN
750.0000 mg | Freq: Once | INTRAVENOUS | Status: AC
  Administered 2018-06-12: 750 mg via INTRAVENOUS
  Filled 2018-06-12: qty 15

## 2018-06-12 NOTE — Patient Instructions (Signed)
Ferric carboxymaltose injection What is this medicine? FERRIC CARBOXYMALTOSE (ferr-ik car-box-ee-mol-toes) is an iron complex. Iron is used to make healthy red blood cells, which carry oxygen and nutrients throughout the body. This medicine is used to treat anemia in people with chronic kidney disease or people who cannot take iron by mouth. This medicine may be used for other purposes; ask your health care provider or pharmacist if you have questions. COMMON BRAND NAME(S): Injectafer What should I tell my health care provider before I take this medicine? They need to know if you have any of these conditions: -high levels of iron in the blood -liver disease -an unusual or allergic reaction to iron, other medicines, foods, dyes, or preservatives -pregnant or trying to get pregnant -breast-feeding How should I use this medicine? This medicine is for infusion into a vein. It is given by a health care professional in a hospital or clinic setting. Talk to your pediatrician regarding the use of this medicine in children. Special care may be needed. Overdosage: If you think you have taken too much of this medicine contact a poison control center or emergency room at once. NOTE: This medicine is only for you. Do not share this medicine with others. What if I miss a dose? It is important not to miss your dose. Call your doctor or health care professional if you are unable to keep an appointment. What may interact with this medicine? Do not take this medicine with any of the following medications: -deferoxamine -dimercaprol -other iron products This list may not describe all possible interactions. Give your health care provider a list of all the medicines, herbs, non-prescription drugs, or dietary supplements you use. Also tell them if you smoke, drink alcohol, or use illegal drugs. Some items may interact with your medicine. What should I watch for while using this medicine? Visit your doctor or  health care professional regularly. Tell your doctor if your symptoms do not start to get better or if they get worse. You may need blood work done while you are taking this medicine. You may need to follow a special diet. Talk to your doctor. Foods that contain iron include: whole grains/cereals, dried fruits, beans, or peas, leafy green vegetables, and organ meats (liver, kidney). What side effects may I notice from receiving this medicine? Side effects that you should report to your doctor or health care professional as soon as possible: -allergic reactions like skin rash, itching or hives, swelling of the face, lips, or tongue -dizziness -facial flushing Side effects that usually do not require medical attention (report to your doctor or health care professional if they continue or are bothersome): -changes in taste -constipation -headache -nausea, vomiting -pain, redness, or irritation at site where injected This list may not describe all possible side effects. Call your doctor for medical advice about side effects. You may report side effects to FDA at 1-800-FDA-1088. Where should I keep my medicine? This drug is given in a hospital or clinic and will not be stored at home. NOTE: This sheet is a summary. It may not cover all possible information. If you have questions about this medicine, talk to your doctor, pharmacist, or health care provider.  2019 Elsevier/Gold Standard (2016-03-04 09:40:29)  Coronavirus (COVID-19) Are you at risk?  Are you at risk for the Coronavirus (COVID-19)?  To be considered HIGH RISK for Coronavirus (COVID-19), you have to meet the following criteria:  . Traveled to [place], [place], [place], [place] or [place]; or in [place]  States to [place], [place], [place], or [place]; and have fever, cough, and shortness of breath within the last 2 weeks of travel OR . Been in close contact with a person diagnosed with COVID-19 within the last 2 weeks and  have fever, cough, and shortness of breath . IF YOU DO NOT MEET THESE CRITERIA, YOU ARE CONSIDERED LOW RISK FOR COVID-19.  What to do if you are HIGH RISK for COVID-19?  Marland Kitchen If you are having a medical emergency, call 911. . Seek medical care right away. Before you go to a doctor's office, urgent care or emergency department, call ahead and tell them about your recent travel, contact with someone diagnosed with COVID-19, and your symptoms. You should receive instructions from your physician's office regarding next steps of care.  . When you arrive at healthcare provider, tell the healthcare staff immediately you have returned from visiting [place], [place], [place], [place] or [place]; or traveled in the [place] to [place], [place], [place], or [place]; in the last two weeks or you have been in close contact with a person diagnosed with COVID-19 in the last 2 weeks.   . Tell the health care staff about your symptoms: fever, cough and shortness of breath. . After you have been seen by a medical provider, you will be either: o Tested for (COVID-19) and discharged home on quarantine except to seek medical care if symptoms worsen, and asked to  - Stay home and avoid contact with others until you get your results (4-5 days)  - Avoid travel on public transportation if possible (such as bus, train, or airplane) or o Sent to the Emergency Department by EMS for evaluation, COVID-19 testing, and possible admission depending on your condition and test results.  What to do if you are LOW RISK for COVID-19?  Reduce your risk of any infection by using the same precautions used for avoiding the common cold or flu:  Marland Kitchen Wash your hands often with soap and warm water for at least 20 seconds.  If soap and water are not readily available, use an alcohol-based hand sanitizer with at least 60% alcohol.  . If coughing or sneezing, cover your mouth and nose by coughing or sneezing into the elbow areas of your  shirt or coat, into a tissue or into your sleeve (not your hands). . Avoid shaking hands with others and consider head nods or verbal greetings only. . Avoid touching your eyes, nose, or mouth with unwashed hands.  . Avoid close contact with people who are sick. . Avoid places or events with large numbers of people in one location, like concerts or sporting events. . Carefully consider travel plans you have or are making. . If you are planning any travel outside or inside the [place], visit the CDC's Travelers' Health webpage for the latest health notices. . If you have some symptoms but not all symptoms, continue to monitor at home and seek medical attention if your symptoms worsen. . If you are having a medical emergency, call 911.   [place] / e-Visit: [email or web address]         [facility]: [facility]: [phone]                   [facility]: [phone]

## 2018-06-13 ENCOUNTER — Encounter (HOSPITAL_COMMUNITY): Payer: Self-pay | Admitting: *Deleted

## 2018-06-13 ENCOUNTER — Other Ambulatory Visit: Payer: Self-pay

## 2018-06-13 LAB — NOVEL CORONAVIRUS, NAA (HOSP ORDER, SEND-OUT TO REF LAB; TAT 18-24 HRS): SARS-CoV-2, NAA: NOT DETECTED

## 2018-06-13 NOTE — Progress Notes (Signed)
Mrs Tamara Watson denies chest pain or shortness of breath. Patient had COVID test on May 12, she then went home until MD appointment today. Patient denies that she or her family has experienced any of the following: Cough Fever >100.4 Runny Nose Sore Throat Difficulty breathing/ shortness of breath Travel in past 14 days- no

## 2018-06-13 NOTE — H&P (Addendum)
Tamara Watson is a 48 y.o. female,  P: 3-0-3-3 who presents for hysterectomy because of menorrhagia, anemia,  symptomatic uterine fibroids and adenomyosis.  For over 5 years the patient reports a 5 day menstrual flow that has required her to use "pull-ups" every 2 hours to prevent soiling her clothing.  Additionally she has had cramping that she rates 10/10 on a 10 point pain scale that is decreased to 3/10 with Ibuprofen 800 mg.  Over the past several months,  she has also noted that the cramping will last 1 week after her menstrual flow accompanied by a clear, odor free vaginal discharge that requires her to wear a pantyliner.  She denies any intermenstrual bleeding or vaginitis symptoms but admits to dyspareunia, chronic constipation, flatulence, sensation of  incomplete emptying of stool, urinary frequency, urgency and pelvic pressure prior to the need to void.  Prior to the past 2 months the patient had not taken anything  to manage her bleeding. She was prescribed Lysteda in March,  which allowed her to change her "pull-ups" every 3 instead of every 2 hours but her cramping remained the same. A hemoglobin of  7.1 in February  caused the patient to undergo #2 iron infusions along with taking her iron supplement daily.  Before that treatment she reports having  fatigue, headache and dizziness. Her most recent hemoglobin was 11.4 which has made her feel much better.  A pelvic ultrasound in March 2020 revealed an anteverted uterus: (12.8 cm from fundus to external os) 9.90 x 9.11 x 9.10 cm, endometrium: 1.39 cm with myometrium that is heterogenous making delineation of fibroid borders difficult.  Two measurable fibroids: anterior right intramural-6.6 cm and posterior right sub-serosal-6.7 cm; right ovary- 3.2 cm and left ovary-2.7 cm.   The patient was given both medical and surgical management options for the management of her symptoms however, given the debilitating and disruptive nature of her  symptoms, she has chosen definitive therapy in the form of hysterectomy.  Past Medical History  OB History: G: 6;  P: 3-0-3-3;  SVB: 1998, 2000 and 2002  GYN History: menarche:48 YO    LMP: 06/05/2018;    Contraception: Tubal Sterilization Denies history of abnormal PAP smear or STD testing   Last PAP smear: 2020 normal with negative HPV  Medical History: GERD, Anemia,  Anxiety and Vitamin D Deficiency   Surgical History: 1996 D & C;  1997  D & C;  2000 Left Breast Lumpectomy (benign) and 2002 Tubal Sterilization Denies problems with anesthesia or history of blood transfusions  Family History: Diabetes Mellitus, Ovarian Cancer, Breast Cancer, Pancreatic Cancer, Hypertension and  Hyperlipidemia  Social History: 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   ROS: Admits to flatulence, chronic constipation, post nasal drainage, but  denies headache, vision changes, dysphagia, tinnitus, dizziness, hoarseness,  chest pain, shortness of breath, nausea, vomiting, diarrhea, hematuria, vaginitis symptoms, pelvic pain, swelling of joints,easy bruising,  myalgias, arthralgias, skin rashes, unexplained weight loss and except as  is mentioned in the history of present illness, patient's review of systems is otherwise negative.  Physical Exam  Bp:  118/70      P: 99 bpm    O2Sat. 100% (room air)      Temperature: 98.2 degrees F orally       Weight:   214 lbs.   Height: 5' 4.5"  BMI: 36.2   BMI:  Neck: supple without masses or thyromegaly Lungs: clear to  auscultation Heart: regular rate and rhythm Abdomen: soft, non-tender and no organomegaly Pelvic:EGBUS- wnl; vagina-normal rugae; uterus-12-14-weeks size, cervix without lesions or motion tenderness; adnexae-no tenderness or masses Extremities:  no clubbing, cyanosis or edema   Assesment: Menorrhagia                      Anemia                      Symptomatic Uterine Fibroids                      Adenomyosis   Disposition:  A discussion was held with patient regarding the indication for her procedure(s) along with the risks, which include but are not limited to: reaction to anesthesia, damage to adjacent organs, infection and excessive bleeding. The patient verbalized understanding of these risks and has consented to proceed with a Total Abdominal Hysterectomy with Bilateral Salpingectomy at [facility] on Jun 14, 2018.   CSN# [id number]   [place] J. Florene Glen, PA-C  for Dr.Ezella Kell [facility]

## 2018-06-14 ENCOUNTER — Encounter (HOSPITAL_COMMUNITY)
Admission: RE | Disposition: A | Discharge status: Home / Self Care | Payer: Self-pay | Source: Home / Self Care | Attending: Obstetrics & Gynecology

## 2018-06-14 ENCOUNTER — Inpatient Hospital Stay (HOSPITAL_COMMUNITY)
Admission: RE | Admit: 2018-06-14 | Discharge: 2018-06-16 | DRG: 742 | Disposition: A | Discharge status: Home / Self Care | Payer: Managed Care, Other (non HMO) | Attending: Obstetrics & Gynecology | Admitting: Obstetrics & Gynecology

## 2018-06-14 ENCOUNTER — Encounter (HOSPITAL_COMMUNITY): Payer: Self-pay

## 2018-06-14 ENCOUNTER — Inpatient Hospital Stay (HOSPITAL_COMMUNITY): Payer: Managed Care, Other (non HMO) | Admitting: Vascular Surgery

## 2018-06-14 DIAGNOSIS — R110 Nausea: Secondary | ICD-10-CM | POA: Diagnosis not present

## 2018-06-14 DIAGNOSIS — N941 Unspecified dyspareunia: Secondary | ICD-10-CM | POA: Diagnosis present

## 2018-06-14 DIAGNOSIS — Z79899 Other long term (current) drug therapy: Secondary | ICD-10-CM

## 2018-06-14 DIAGNOSIS — D62 Acute posthemorrhagic anemia: Secondary | ICD-10-CM | POA: Diagnosis not present

## 2018-06-14 DIAGNOSIS — Z8041 Family history of malignant neoplasm of ovary: Secondary | ICD-10-CM | POA: Diagnosis not present

## 2018-06-14 DIAGNOSIS — N946 Dysmenorrhea, unspecified: Secondary | ICD-10-CM | POA: Diagnosis present

## 2018-06-14 DIAGNOSIS — Z803 Family history of malignant neoplasm of breast: Secondary | ICD-10-CM

## 2018-06-14 DIAGNOSIS — K219 Gastro-esophageal reflux disease without esophagitis: Secondary | ICD-10-CM | POA: Diagnosis present

## 2018-06-14 DIAGNOSIS — Z8249 Family history of ischemic heart disease and other diseases of the circulatory system: Secondary | ICD-10-CM | POA: Diagnosis not present

## 2018-06-14 DIAGNOSIS — Z833 Family history of diabetes mellitus: Secondary | ICD-10-CM | POA: Diagnosis not present

## 2018-06-14 DIAGNOSIS — D252 Subserosal leiomyoma of uterus: Secondary | ICD-10-CM | POA: Diagnosis present

## 2018-06-14 DIAGNOSIS — N854 Malposition of uterus: Secondary | ICD-10-CM | POA: Diagnosis present

## 2018-06-14 DIAGNOSIS — N920 Excessive and frequent menstruation with regular cycle: Secondary | ICD-10-CM | POA: Diagnosis present

## 2018-06-14 DIAGNOSIS — Z800 Family history of malignant neoplasm of digestive organs: Secondary | ICD-10-CM | POA: Diagnosis not present

## 2018-06-14 DIAGNOSIS — D251 Intramural leiomyoma of uterus: Principal | ICD-10-CM | POA: Diagnosis present

## 2018-06-14 DIAGNOSIS — F419 Anxiety disorder, unspecified: Secondary | ICD-10-CM | POA: Diagnosis present

## 2018-06-14 DIAGNOSIS — D259 Leiomyoma of uterus, unspecified: Secondary | ICD-10-CM | POA: Diagnosis present

## 2018-06-14 DIAGNOSIS — D500 Iron deficiency anemia secondary to blood loss (chronic): Secondary | ICD-10-CM | POA: Diagnosis present

## 2018-06-14 DIAGNOSIS — E559 Vitamin D deficiency, unspecified: Secondary | ICD-10-CM | POA: Diagnosis present

## 2018-06-14 DIAGNOSIS — K5909 Other constipation: Secondary | ICD-10-CM | POA: Diagnosis present

## 2018-06-14 HISTORY — PX: CYSTOSCOPY: SHX5120

## 2018-06-14 HISTORY — PX: HYSTERECTOMY ABDOMINAL WITH SALPINGECTOMY: SHX6725

## 2018-06-14 LAB — BASIC METABOLIC PANEL
Anion gap: 11 (ref 5–15)
BUN: 7 mg/dL (ref 6–20)
CO2: 22 mmol/L (ref 22–32)
Calcium: 9.3 mg/dL (ref 8.9–10.3)
Chloride: 106 mmol/L (ref 98–111)
Creatinine, Ser: 0.73 mg/dL (ref 0.44–1.00)
GFR calc Af Amer: >60 mL/min (ref >60)
GFR calc non Af Amer: >60 mL/min (ref >60)
Glucose, Bld: 91 mg/dL (ref 70–99)
Potassium: 3.5 mmol/L (ref 3.5–5.1)
Sodium: 139 mmol/L (ref 135–145)

## 2018-06-14 LAB — CBC
HCT: 44.7 % (ref 36.0–46.0)
Hemoglobin: 13.9 g/dL (ref 12.0–15.0)
MCH: 24.3 pg — ABNORMAL LOW (ref 26.0–34.0)
MCHC: 31.1 g/dL (ref 30.0–36.0)
MCV: 78.1 fL — ABNORMAL LOW (ref 80.0–100.0)
Platelets: 255 10*3/uL (ref 150–400)
RBC: 5.72 MIL/uL — ABNORMAL HIGH (ref 3.87–5.11)
RDW: 25.2 % — ABNORMAL HIGH (ref 11.5–15.5)
WBC: 4.9 10*3/uL (ref 4.0–10.5)
nRBC: 0 % (ref 0.0–0.2)

## 2018-06-14 LAB — TYPE AND SCREEN
ABO/RH(D): B POS
Antibody Screen: NEGATIVE

## 2018-06-14 LAB — ABO/RH: ABO/RH(D): B POS

## 2018-06-14 LAB — POCT PREGNANCY, URINE: Preg Test, Ur: NEGATIVE

## 2018-06-14 SURGERY — HYSTERECTOMY, TOTAL, ABDOMINAL, WITH SALPINGECTOMY
Anesthesia: General | Site: Urethra | Laterality: Bilateral

## 2018-06-14 MED ORDER — ROCURONIUM BROMIDE 10 MG/ML (PF) SYRINGE
PREFILLED_SYRINGE | INTRAVENOUS | Status: AC
  Filled 2018-06-14: qty 10

## 2018-06-14 MED ORDER — FENTANYL CITRATE (PF) 250 MCG/5ML IJ SOLN
INTRAMUSCULAR | Status: AC
  Filled 2018-06-14: qty 5

## 2018-06-14 MED ORDER — ACETAMINOPHEN 325 MG PO TABS: 325.0000 mg | ORAL_TABLET | ORAL | Status: DC | PRN

## 2018-06-14 MED ORDER — OXYCODONE HCL 5 MG PO TABS
ORAL_TABLET | ORAL | Status: AC
  Filled 2018-06-14: qty 1

## 2018-06-14 MED ORDER — SUCCINYLCHOLINE CHLORIDE 200 MG/10ML IV SOSY
PREFILLED_SYRINGE | INTRAVENOUS | Status: AC
  Filled 2018-06-14: qty 10

## 2018-06-14 MED ORDER — OXYCODONE-ACETAMINOPHEN 5-325 MG PO TABS
1.0000 | ORAL_TABLET | Freq: Four times a day (QID) | ORAL | Status: DC | PRN
  Administered 2018-06-14 – 2018-06-16 (×6): 2 via ORAL
  Filled 2018-06-14 (×6): qty 2

## 2018-06-14 MED ORDER — LIDOCAINE 2% (20 MG/ML) 5 ML SYRINGE
INTRAMUSCULAR | Status: AC
  Filled 2018-06-14: qty 5

## 2018-06-14 MED ORDER — FUSION PLUS PO CAPS: 1.0000 | ORAL_CAPSULE | Freq: Every day | ORAL | Status: DC

## 2018-06-14 MED ORDER — STERILE WATER FOR IRRIGATION IR SOLN
Status: DC | PRN
  Administered 2018-06-14: 1000 mL via INTRAVESICAL

## 2018-06-14 MED ORDER — SENNOSIDES-DOCUSATE SODIUM 8.6-50 MG PO TABS: 1.0000 | ORAL_TABLET | Freq: Every evening | ORAL | Status: DC | PRN

## 2018-06-14 MED ORDER — STERILE WATER FOR IRRIGATION IR SOLN
Status: DC | PRN
  Administered 2018-06-14: 17:00:00 via INTRAVESICAL

## 2018-06-14 MED ORDER — PROPOFOL 10 MG/ML IV BOLUS
INTRAVENOUS | Status: AC
  Filled 2018-06-14: qty 20

## 2018-06-14 MED ORDER — EPHEDRINE 5 MG/ML INJ
INTRAVENOUS | Status: AC
  Filled 2018-06-14: qty 10

## 2018-06-14 MED ORDER — ACETAMINOPHEN 160 MG/5ML PO SOLN: 325.0000 mg | ORAL | Status: DC | PRN

## 2018-06-14 MED ORDER — ONDANSETRON HCL 4 MG/2ML IJ SOLN: 4.0000 mg | Freq: Once | INTRAMUSCULAR | Status: DC | PRN

## 2018-06-14 MED ORDER — PHENYLEPHRINE 40 MCG/ML (10ML) SYRINGE FOR IV PUSH (FOR BLOOD PRESSURE SUPPORT)
PREFILLED_SYRINGE | INTRAVENOUS | Status: AC
  Filled 2018-06-14: qty 10

## 2018-06-14 MED ORDER — ALBUMIN HUMAN 5 % IV SOLN
INTRAVENOUS | Status: DC | PRN
  Administered 2018-06-14 (×2): via INTRAVENOUS

## 2018-06-14 MED ORDER — PHENYLEPHRINE 40 MCG/ML (10ML) SYRINGE FOR IV PUSH (FOR BLOOD PRESSURE SUPPORT)
PREFILLED_SYRINGE | INTRAVENOUS | Status: DC | PRN
  Administered 2018-06-14: 120 ug via INTRAVENOUS

## 2018-06-14 MED ORDER — DIPHENHYDRAMINE HCL 12.5 MG/5ML PO ELIX: 12.5000 mg | ORAL_SOLUTION | Freq: Four times a day (QID) | ORAL | Status: DC | PRN

## 2018-06-14 MED ORDER — FENTANYL CITRATE (PF) 100 MCG/2ML IJ SOLN
INTRAMUSCULAR | Status: AC
  Filled 2018-06-14: qty 2

## 2018-06-14 MED ORDER — METHYLENE BLUE 0.5 % INJ SOLN
INTRAVENOUS | Status: AC
  Filled 2018-06-14: qty 10

## 2018-06-14 MED ORDER — SUCCINYLCHOLINE CHLORIDE 200 MG/10ML IV SOSY
PREFILLED_SYRINGE | INTRAVENOUS | Status: DC | PRN
  Administered 2018-06-14: 130 mg via INTRAVENOUS

## 2018-06-14 MED ORDER — LACTATED RINGERS IV SOLN
INTRAVENOUS | Status: DC
  Administered 2018-06-14 – 2018-06-15 (×2): via INTRAVENOUS

## 2018-06-14 MED ORDER — SUGAMMADEX SODIUM 200 MG/2ML IV SOLN
INTRAVENOUS | Status: DC | PRN
  Administered 2018-06-14: 200 mg via INTRAVENOUS

## 2018-06-14 MED ORDER — ONDANSETRON HCL 4 MG PO TABS
4.0000 mg | ORAL_TABLET | Freq: Four times a day (QID) | ORAL | Status: DC | PRN
  Administered 2018-06-16: 09:00:00 4 mg via ORAL
  Filled 2018-06-14: qty 1

## 2018-06-14 MED ORDER — PROPOFOL 10 MG/ML IV BOLUS
INTRAVENOUS | Status: DC | PRN
  Administered 2018-06-14: 160 mg via INTRAVENOUS

## 2018-06-14 MED ORDER — IBUPROFEN 600 MG PO TABS: 600.0000 mg | ORAL_TABLET | Freq: Four times a day (QID) | ORAL | Status: DC

## 2018-06-14 MED ORDER — KETOROLAC TROMETHAMINE 30 MG/ML IJ SOLN
30.0000 mg | Freq: Four times a day (QID) | INTRAMUSCULAR | Status: DC
  Administered 2018-06-14 – 2018-06-15 (×4): 30 mg via INTRAVENOUS
  Filled 2018-06-14 (×4): qty 1

## 2018-06-14 MED ORDER — LIDOCAINE 2% (20 MG/ML) 5 ML SYRINGE
INTRAMUSCULAR | Status: DC | PRN
  Administered 2018-06-14: 70 mg via INTRAVENOUS

## 2018-06-14 MED ORDER — NALOXONE HCL 0.4 MG/ML IJ SOLN: 0.4000 mg | INTRAMUSCULAR | Status: DC | PRN

## 2018-06-14 MED ORDER — ROCURONIUM BROMIDE 50 MG/5ML IV SOSY
PREFILLED_SYRINGE | INTRAVENOUS | Status: DC | PRN
  Administered 2018-06-14: 10 mg via INTRAVENOUS
  Administered 2018-06-14: 20 mg via INTRAVENOUS
  Administered 2018-06-14: 30 mg via INTRAVENOUS
  Administered 2018-06-14 (×2): 10 mg via INTRAVENOUS
  Administered 2018-06-14: 20 mg via INTRAVENOUS
  Administered 2018-06-14: 50 mg via INTRAVENOUS

## 2018-06-14 MED ORDER — DEXAMETHASONE SODIUM PHOSPHATE 10 MG/ML IJ SOLN
INTRAMUSCULAR | Status: AC
  Filled 2018-06-14: qty 2

## 2018-06-14 MED ORDER — PANTOPRAZOLE SODIUM 40 MG PO TBEC
40.0000 mg | DELAYED_RELEASE_TABLET | Freq: Every day | ORAL | Status: DC
  Administered 2018-06-14 – 2018-06-16 (×3): 40 mg via ORAL
  Filled 2018-06-14 (×3): qty 1

## 2018-06-14 MED ORDER — DIPHENHYDRAMINE HCL 50 MG/ML IJ SOLN: 12.5000 mg | Freq: Four times a day (QID) | INTRAMUSCULAR | Status: DC | PRN

## 2018-06-14 MED ORDER — ONDANSETRON HCL 4 MG/2ML IJ SOLN
INTRAMUSCULAR | Status: AC
  Filled 2018-06-14: qty 2

## 2018-06-14 MED ORDER — MENTHOL 3 MG MT LOZG: 1.0000 | LOZENGE | OROMUCOSAL | Status: DC | PRN

## 2018-06-14 MED ORDER — ROCURONIUM BROMIDE 10 MG/ML (PF) SYRINGE
PREFILLED_SYRINGE | INTRAVENOUS | Status: AC
  Filled 2018-06-14: qty 30

## 2018-06-14 MED ORDER — KETOROLAC TROMETHAMINE 30 MG/ML IJ SOLN
INTRAMUSCULAR | Status: AC
  Filled 2018-06-14: qty 1

## 2018-06-14 MED ORDER — SIMETHICONE 80 MG PO CHEW: 80.0000 mg | CHEWABLE_TABLET | Freq: Four times a day (QID) | ORAL | Status: DC | PRN

## 2018-06-14 MED ORDER — DOCUSATE SODIUM 100 MG PO CAPS
100.0000 mg | ORAL_CAPSULE | Freq: Two times a day (BID) | ORAL | Status: DC
  Administered 2018-06-14 – 2018-06-16 (×4): 100 mg via ORAL
  Filled 2018-06-14 (×4): qty 1

## 2018-06-14 MED ORDER — MIDAZOLAM HCL 5 MG/5ML IJ SOLN
INTRAMUSCULAR | Status: DC | PRN
  Administered 2018-06-14: 2 mg via INTRAVENOUS

## 2018-06-14 MED ORDER — CEFAZOLIN SODIUM 1 G IJ SOLR
INTRAMUSCULAR | Status: AC
  Filled 2018-06-14: qty 10

## 2018-06-14 MED ORDER — OXYCODONE HCL 5 MG/5ML PO SOLN: 5.0000 mg | Freq: Once | ORAL | Status: DC | PRN

## 2018-06-14 MED ORDER — KETOROLAC TROMETHAMINE 30 MG/ML IJ SOLN
INTRAMUSCULAR | Status: DC | PRN
  Administered 2018-06-14: 30 mg via INTRAVENOUS

## 2018-06-14 MED ORDER — ONDANSETRON HCL 4 MG/2ML IJ SOLN
4.0000 mg | Freq: Four times a day (QID) | INTRAMUSCULAR | Status: DC | PRN
  Filled 2018-06-14: qty 2

## 2018-06-14 MED ORDER — DEXAMETHASONE SODIUM PHOSPHATE 10 MG/ML IJ SOLN
INTRAMUSCULAR | Status: DC | PRN
  Administered 2018-06-14: 10 mg via INTRAVENOUS

## 2018-06-14 MED ORDER — ONDANSETRON HCL 4 MG/2ML IJ SOLN: 4.0000 mg | Freq: Four times a day (QID) | INTRAMUSCULAR | Status: DC | PRN

## 2018-06-14 MED ORDER — FENTANYL CITRATE (PF) 100 MCG/2ML IJ SOLN
25.0000 ug | INTRAMUSCULAR | Status: DC | PRN
  Administered 2018-06-14 (×3): 50 ug via INTRAVENOUS

## 2018-06-14 MED ORDER — INDIGOTINDISULFONATE SODIUM 8 MG/ML IJ SOLN
40.0000 mg | INTRAMUSCULAR | Status: AC
  Administered 2018-06-14: 17:00:00 40 mg via INTRAVENOUS
  Filled 2018-06-14: qty 5

## 2018-06-14 MED ORDER — MIDAZOLAM HCL 2 MG/2ML IJ SOLN
INTRAMUSCULAR | Status: AC
  Filled 2018-06-14: qty 2

## 2018-06-14 MED ORDER — LACTATED RINGERS IV SOLN
INTRAVENOUS | Status: DC
  Administered 2018-06-14 (×3): via INTRAVENOUS

## 2018-06-14 MED ORDER — CEFAZOLIN SODIUM-DEXTROSE 2-4 GM/100ML-% IV SOLN
2.0000 g | INTRAVENOUS | Status: AC
  Administered 2018-06-14: 17:00:00 1 g via INTRAVENOUS
  Administered 2018-06-14: 2 g via INTRAVENOUS

## 2018-06-14 MED ORDER — SODIUM CHLORIDE 0.9% FLUSH: 9.0000 mL | INTRAVENOUS | Status: DC | PRN

## 2018-06-14 MED ORDER — CEFAZOLIN SODIUM-DEXTROSE 2-4 GM/100ML-% IV SOLN
INTRAVENOUS | Status: AC
  Filled 2018-06-14: qty 100

## 2018-06-14 MED ORDER — MEPERIDINE HCL 25 MG/ML IJ SOLN: 6.2500 mg | INTRAMUSCULAR | Status: DC | PRN

## 2018-06-14 MED ORDER — FENTANYL CITRATE (PF) 250 MCG/5ML IJ SOLN
INTRAMUSCULAR | Status: DC | PRN
  Administered 2018-06-14 (×3): 50 ug via INTRAVENOUS
  Administered 2018-06-14: 100 ug via INTRAVENOUS
  Administered 2018-06-14 (×2): 50 ug via INTRAVENOUS

## 2018-06-14 MED ORDER — HYDROMORPHONE 1 MG/ML IV SOLN
INTRAVENOUS | Status: DC
  Filled 2018-06-14: qty 30

## 2018-06-14 MED ORDER — ONDANSETRON HCL 4 MG/2ML IJ SOLN
INTRAMUSCULAR | Status: DC | PRN
  Administered 2018-06-14: 4 mg via INTRAVENOUS

## 2018-06-14 MED ORDER — OXYCODONE HCL 5 MG PO TABS
5.0000 mg | ORAL_TABLET | Freq: Once | ORAL | Status: DC | PRN
  Administered 2018-06-14: 5 mg via ORAL

## 2018-06-14 SURGICAL SUPPLY — 48 items
AGENT HMST KT MTR STRL THRMB (HEMOSTASIS) ×2
APL SKNCLS STERI-STRIP NONHPOA (GAUZE/BANDAGES/DRESSINGS) ×2
BARRIER ADHS 3X4 INTERCEED (GAUZE/BANDAGES/DRESSINGS) ×4 IMPLANT
BENZOIN TINCTURE PRP APPL 2/3 (GAUZE/BANDAGES/DRESSINGS) ×4 IMPLANT
BRR ADH 4X3 ABS CNTRL BYND (GAUZE/BANDAGES/DRESSINGS) ×2
CANISTER SUCT 3000ML PPV (MISCELLANEOUS) ×4 IMPLANT
CLOSURE WOUND 1/4X4 (GAUZE/BANDAGES/DRESSINGS) ×1
COVER WAND RF STERILE (DRAPES) ×4 IMPLANT
DRAPE HALF SHEET 40X57 (DRAPES) ×12 IMPLANT
DRAPE WARM FLUID 44X44 (DRAPE) ×4 IMPLANT
DRSG OPSITE POSTOP 4X10 (GAUZE/BANDAGES/DRESSINGS) ×4 IMPLANT
DURAPREP 26ML APPLICATOR (WOUND CARE) ×4 IMPLANT
GAUZE 4X4 16PLY RFD (DISPOSABLE) ×8 IMPLANT
GLOVE BIOGEL PI IND STRL 7.0 (GLOVE) ×6 IMPLANT
GLOVE BIOGEL PI INDICATOR 7.0 (GLOVE) ×6
GLOVE SURG SS PI 6.5 STRL IVOR (GLOVE) ×8 IMPLANT
GOWN STRL REUS W/ TWL LRG LVL3 (GOWN DISPOSABLE) ×6 IMPLANT
GOWN STRL REUS W/TWL LRG LVL3 (GOWN DISPOSABLE) ×12
KIT TURNOVER KIT B (KITS) ×4 IMPLANT
NEEDLE 18GX1X1/2 (RX/OR ONLY) (NEEDLE) ×4 IMPLANT
NS IRRIG 1000ML POUR BTL (IV SOLUTION) ×4 IMPLANT
PACK ABDOMINAL GYN (CUSTOM PROCEDURE TRAY) ×4 IMPLANT
PAD ARMBOARD 7.5X6 YLW CONV (MISCELLANEOUS) ×4 IMPLANT
PAD OB MATERNITY 4.3X12.25 (PERSONAL CARE ITEMS) ×4 IMPLANT
RETRACTOR WND ALEXIS 25 LRG (MISCELLANEOUS) ×2 IMPLANT
RTRCTR WOUND ALEXIS 25CM LRG (MISCELLANEOUS) ×4
SET CYSTO W/LG BORE CLAMP LF (SET/KITS/TRAYS/PACK) ×4 IMPLANT
SLEEVE SCD COMPRESS KNEE MED (MISCELLANEOUS) ×4 IMPLANT
SPONGE LAP 18X18 RF (DISPOSABLE) ×12 IMPLANT
STRIP CLOSURE SKIN 1/4X4 (GAUZE/BANDAGES/DRESSINGS) ×3 IMPLANT
SURGIFLO W/THROMBIN 8M KIT (HEMOSTASIS) ×4 IMPLANT
SUT CHROMIC 0 CT 1 (SUTURE) ×4 IMPLANT
SUT MON AB 4-0 PS1 27 (SUTURE) ×4 IMPLANT
SUT PLAIN 2 0 XLH (SUTURE) ×8 IMPLANT
SUT VIC AB 0 CT1 18XCR BRD8 (SUTURE) ×8 IMPLANT
SUT VIC AB 0 CT1 27 (SUTURE) ×16
SUT VIC AB 0 CT1 27XBRD ANBCTR (SUTURE) ×8 IMPLANT
SUT VIC AB 0 CT1 8-18 (SUTURE) ×16
SUT VIC AB 2-0 CT1 27 (SUTURE) ×16
SUT VIC AB 2-0 CT1 TAPERPNT 27 (SUTURE) ×8 IMPLANT
SUT VIC AB 3-0 CT1 27 (SUTURE)
SUT VIC AB 3-0 CT1 TAPERPNT 27 (SUTURE) IMPLANT
SUT VICRYL 0 27 CT2 27 ABS (SUTURE) IMPLANT
SUT VICRYL 0 TIES 12 18 (SUTURE) ×4 IMPLANT
SYR 10ML LL (SYRINGE) ×8 IMPLANT
TOWEL GREEN STERILE FF (TOWEL DISPOSABLE) ×8 IMPLANT
TRAY FOLEY W/BAG SLVR 14FR (SET/KITS/TRAYS/PACK) ×4 IMPLANT
WATER STERILE IRR 1000ML UROMA (IV SOLUTION) ×8 IMPLANT

## 2018-06-14 NOTE — Brief Op Note (Signed)
06/14/2018  6:50 PM  PATIENT:  Tamara Watson  48 y.o. female  PRE-OPERATIVE DIAGNOSIS:  Fibroids, Menorrhagia, Dysmenorrhea  POST-OPERATIVE DIAGNOSIS:  Fibroids, Menorrhagia, Dysmenorrhea  PROCEDURE:  Procedure(s): HYSTERECTOMY ABDOMINAL WITH  BILATERAL SALPINGECTOMY (Bilateral) Cystoscopy  SURGEON:  Surgeon(s) and Role:    * Waymon Amato, MD - Primary  PHYSICIAN ASSISTANT: Earnstine Regal, PA  ANESTHESIA:   general  EBL:  600 mL   BLOOD ADMINISTERED:none  DRAINS: none   LOCAL MEDICATIONS USED:  NONE  SPECIMEN:  Source of Specimen:  Uterus with cervix and bilateral fallopian tubes.   DISPOSITION OF SPECIMEN:  PATHOLOGY  COUNTS:  YES  TOURNIQUET:  * No tourniquets in log *  DICTATION: .Note written in EPIC  PLAN OF CARE: Admit to inpatient   PATIENT DISPOSITION:  PACU - hemodynamically stable.   Delay start of Pharmacological VTE agent (>24hrs) due to surgical blood loss or risk of bleeding: not applicable  Dr. Alesia Richards. 06/14/2018.  1912.

## 2018-06-14 NOTE — Anesthesia Procedure Notes (Signed)
Procedure Name: Intubation Date/Time: 06/14/2018 1:56 PM Performed by: Shirlyn Goltz, CRNA Pre-anesthesia Checklist: Patient identified, Emergency Drugs available, Suction available and Patient being monitored Patient Re-evaluated:Patient Re-evaluated prior to induction Oxygen Delivery Method: Circle system utilized Preoxygenation: Pre-oxygenation with 100% oxygen Induction Type: IV induction Laryngoscope Size: Mac and 3 Grade View: Grade II Tube type: Oral Tube size: 7.0 mm Number of attempts: 1 Airway Equipment and Method: Stylet Placement Confirmation: ETT inserted through vocal cords under direct vision,  positive ETCO2 and breath sounds checked- equal and bilateral Secured at: 21 cm Tube secured with: Tape Dental Injury: Teeth and Oropharynx as per pre-operative assessment

## 2018-06-14 NOTE — Op Note (Addendum)
Tamara Watson DOB [birth year]   MRN: [id number]  Pre-op diagnosis:   1. Symptomatic Fibroid uterus with menorrhagia, dysmenorrhea, dyspareunia, chronic constipation, urinary urgency and frequency, pelvic pressure).  2.  Failed medical therapy and patient desiring definitive therapy.  3.    Desire to decrease risk of fallopian tube and ovarian cancer. 4.  BMI of 36.   Post-op diagnosis: Same as above.   Procedure: Total Abdominal Hysterectomy and Bilateral Salpingectomy (TAHBS) and cystoscopy.    Anesthesia:  General- Endotracheal.   Assistant: Earnstine Regal, PA  Complications: Potential bladder injury.   Findings: Uterus with multiple uterine fibroids (477 grams).  Normal ovary and tubes bilaterally.     EBL: 600 cc  IVF: 2200cc NS  Crystalline: 500 cc Albumin.   Urine output:  935 cc urine.   Indications: 48 y/o P3 who desired definitive surgical management of her symptomatic uterine fibroids.  She also desired to decrease her future risk of fallopian tube and ovarian cancer and desired a bilateral salpingectomy with preservation of her ovaries if they were normal.        PROCEDURE:  Informed consent was obtained from the patient to undergo the procedure after explaining the risks, benefits and alternatives of the procedure.  The patient was prepped vaginally and abdominally and draped in the usual sterile fashion and a foley catheter was placed in. IV Ancef was given pre-operatively.     A pfannenstiel incision was made with the scalpel and carried through the underlying subcutaneous layer and fascia with the bovie.  Small perforators on the layers were contained with the bovie.  The fascia was nicked in the midline and the fascia separated from the rectus muscles superiorly, inferiorly and bilaterally.  The rectus muscle was separated in the midline, peritoneum entered and the uterus was inspected and the fibroids noted.  Bowel was packed away and Alexis retractor was  placed in but exposure was inadequate.  Therefore Fredia Sorrow retractor was placed in.      Two long kelly clamps were placed on the uterine cornua and uterus was minimally elevated out of the uterus. The challenge in exposure during her surgery was the anterior and posterior location of the fibroids on the top part of the uterus that limited exposure to the lower parts of the uterus.  Attention was then turned to the adnexa where the left fallopian left fallopian tube was identified.  Two kelly clamps were placed below the tube and mesosalpinx bovied open between the two clamps, then suture ligature placed and left fallopian tube removed.  A similar procedure was done on the right side to remove the right fallopian tube.The round ligament was identified on the right side and doubly clamped with kelly clamps.  It was bisected with bovie and the pedicles sutured.  0-vicryl suture was used unless stated otherwise. The anterior broad was entered with the metzenbaum scissors and incision extended anteriorly to create the bladder flap.  Gauze pad on a ring forcep was used to move the bladder down.  An avascular area was identified in the posterior broad ligament and this was entered with the bovie.  The utero-ovarian ligament and fallopian tube were grasped through this window and doubly clamped and cut. Free tie then suture ligature was placed on the lateral pedicles and suture ligature placed on the medial pedicle.  The broad ligamanet tissues overlying the uterine arteries were dissected away and uterine artery identified at the utero-cervical junction.  It was doubly clamped and  straight kocher placed on lateral uterus. The vessel between kocher and heaney clamp was cut and uterine vessel was doubly ligated.  Straight zepplin clamp was placed on the cervix medial to the uterine artery pedicles and cardinal ligament transected and transfixing stitch placed in.  A similar procedure was done on the left side  of the uterus matching down from the round ligaments to the level of the cardial ligaments.  The pubo-vesical-cervical fascia was cut the scalpel and mobilized anteriorly and posteriorly with the gauze pad on a ring forcep.  Straight zeppelin clamps were further used to match down the cervix bilaterally, getting the cardinal ligaments and then the uterosacral ligaments, ensuring to stay medial to the prior pedicle.  Transfixing stitch was used under the pedicles bilaterally.  Two heaney were then placed below the cervix to meet at the center and the cervix was cut loose with a scalpel.  The vaginal tissue was grasped with Kochers and sutured with figure of eight stitches.  The vaginal cuff angles were suspended to the cardinal and uterosacral ligaments by tying the suture ends of the vaginal cuff to the tagged sutures of the ligaments.     Irrigation with normal saline was applied and suctioned out of pelvis.  Small bleeding on vaginal cuff was contained with sutures.  Surgiflo was applied over the serosa edges and vagina to enhance hemostasis.  Interceed was placed over the vaginal cuff after good hemostasis was noted.  At this point anesthesia informed me of some pink tinge in the urine.  The bladder was back- filled with methylene blue and no bladder defect was noted and there was no spillage of methylene blue into the pelvic cavity even after 300 cc methylene blue instillation with good bladder filling.  The bladder was emptied.  Patient received indigo carmine and cystoscopy with the 30 degree scope was performed and a normal appearing bladder with strong bilateral ureteral jets was noted.  Attention was then turned back to the abdomen. The instruments and laps were removed and count was correct.       The muscle and peritoneum was reapproximated with 0-chromic interrupted stitches.  The fascia was closed off using 0-vicryl in a running stitch. The subcutaneous layer was closed off with plain suture.  The  skin was closed off with 4-0 monocryl in a subcuticular stitch.  Steristrips and honey comb dressing were then placed over the incision.       The patient was awoken from anesthesia and taken to the recovery room in stable condition.   SPECIMEN: Uterus with cervix, left and right fallopian tubes.     Dr. Alesia Richards.  06/14/2018.

## 2018-06-14 NOTE — Interval H&P Note (Signed)
History and Physical Interval Note:  06/14/2018 6:47 PM  Tamara Watson  has presented today for surgery, with the diagnosis of Fibroids, Menorrhagia, Dysmenorrhea.  The various methods of treatment have been discussed with the patient and family. After consideration of risks, benefits and other options for treatment, the patient has consented to  Procedure(s): HYSTERECTOMY ABDOMINAL WITH  BILATERAL SALPINGECTOMY (Bilateral) Cystoscopy as a surgical intervention.  The patient's history has been reviewed, patient examined, no change in status, stable for surgery.  I have reviewed the patient's chart and labs.  Questions were answered to the patient's satisfaction.     Alinda Dooms, MD.

## 2018-06-14 NOTE — Anesthesia Postprocedure Evaluation (Signed)
Anesthesia Post Note  Patient: France D Tom-Johnson  Procedure(s) Performed: HYSTERECTOMY ABDOMINAL WITH  BILATERAL SALPINGECTOMY (Bilateral Abdomen) Cystoscopy (Urethra)     Patient location during evaluation: PACU Anesthesia Type: General Level of consciousness: awake and alert Pain management: pain level controlled Vital Signs Assessment: post-procedure vital signs reviewed and stable Respiratory status: spontaneous breathing, nonlabored ventilation and respiratory function stable Cardiovascular status: blood pressure returned to baseline and stable Postop Assessment: no apparent nausea or vomiting Anesthetic complications: no    Last Vitals:  Vitals:   06/14/18 1934 06/14/18 1949  BP: 126/67 129/66  Pulse: 92 94  Resp: 15 15  Temp:  (!) 36.2 C  SpO2: 100% 100%    Last Pain:  Vitals:   06/14/18 1949  TempSrc:   PainSc: 7                  Bailie Christenbury,W. EDMOND

## 2018-06-14 NOTE — Anesthesia Preprocedure Evaluation (Signed)
Anesthesia Evaluation  Patient identified by MRN, date of birth, ID band Patient awake    Reviewed: Allergy & Precautions, H&P , NPO status , Patient's Chart, lab work & pertinent test results, reviewed documented beta blocker date and time   Airway Mallampati: II  TM Distance: >3 FB Neck ROM: full    Dental no notable dental hx.    Pulmonary neg pulmonary ROS,    Pulmonary exam normal breath sounds clear to auscultation       Cardiovascular Exercise Tolerance: Good negative cardio ROS   Rhythm:regular Rate:Normal     Neuro/Psych negative neurological ROS  negative psych ROS   GI/Hepatic Neg liver ROS, GERD  Medicated,  Endo/Other  negative endocrine ROS  Renal/GU negative Renal ROS  negative genitourinary   Musculoskeletal negative musculoskeletal ROS (+)   Abdominal   Peds negative pediatric ROS (+)  Hematology  (+) Blood dyscrasia, anemia ,   Anesthesia Other Findings   Reproductive/Obstetrics negative OB ROS                             Anesthesia Physical Anesthesia Plan  ASA: II  Anesthesia Plan: General   Post-op Pain Management:    Induction: Intravenous  PONV Risk Score and Plan: 3 and Ondansetron, Treatment may vary due to age or medical condition, Dexamethasone and Midazolam  Airway Management Planned: Oral ETT  Additional Equipment:   Intra-op Plan:   Post-operative Plan: Extubation in OR  Informed Consent: I have reviewed the patients History and Physical, chart, labs and discussed the procedure including the risks, benefits and alternatives for the proposed anesthesia with the patient or authorized representative who has indicated his/her understanding and acceptance.     Dental Advisory Given  Plan Discussed with: CRNA, Anesthesiologist and Surgeon  Anesthesia Plan Comments: (  )        Anesthesia Quick Evaluation

## 2018-06-14 NOTE — Transfer of Care (Signed)
Immediate Anesthesia Transfer of Care Note  Patient: Tamara Watson  Procedure(s) Performed: HYSTERECTOMY ABDOMINAL WITH  BILATERAL SALPINGECTOMY (Bilateral Abdomen) Cystoscopy (Urethra)  Patient Location: PACU  Anesthesia Type:General  Level of Consciousness: awake, oriented, drowsy and patient cooperative  Airway & Oxygen Therapy: Patient Spontanous Breathing and Patient connected to nasal cannula oxygen  Post-op Assessment: Report given to RN and Post -op Vital signs reviewed and stable  Post vital signs: Reviewed and stable  Last Vitals:  Vitals Value Taken Time  BP 127/76 06/14/2018  6:49 PM  Temp    Pulse 105 06/14/2018  6:51 PM  Resp 24 06/14/2018  6:51 PM  SpO2 100 % 06/14/2018  6:51 PM  Vitals shown include unvalidated device data.  Last Pain:  Vitals:   06/14/18 1131  TempSrc:   PainSc: 8       Patients Stated Pain Goal: 2 (39/43/20 0379)  Complications: No apparent anesthesia complications

## 2018-06-15 ENCOUNTER — Encounter (HOSPITAL_COMMUNITY): Payer: Self-pay | Admitting: Obstetrics & Gynecology

## 2018-06-15 LAB — BASIC METABOLIC PANEL
Anion gap: 12 (ref 5–15)
BUN: <5 mg/dL — ABNORMAL LOW (ref 6–20)
CO2: 22 mmol/L (ref 22–32)
Calcium: 8.5 mg/dL — ABNORMAL LOW (ref 8.9–10.3)
Chloride: 104 mmol/L (ref 98–111)
Creatinine, Ser: 0.84 mg/dL (ref 0.44–1.00)
GFR calc Af Amer: >60 mL/min (ref >60)
GFR calc non Af Amer: >60 mL/min (ref >60)
Glucose, Bld: 120 mg/dL — ABNORMAL HIGH (ref 70–99)
Potassium: 3.6 mmol/L (ref 3.5–5.1)
Sodium: 138 mmol/L (ref 135–145)

## 2018-06-15 LAB — CBC
HCT: 32.4 % — ABNORMAL LOW (ref 36.0–46.0)
Hemoglobin: 10.1 g/dL — ABNORMAL LOW (ref 12.0–15.0)
MCH: 24.2 pg — ABNORMAL LOW (ref 26.0–34.0)
MCHC: 31.2 g/dL (ref 30.0–36.0)
MCV: 77.5 fL — ABNORMAL LOW (ref 80.0–100.0)
Platelets: 227 10*3/uL (ref 150–400)
RBC: 4.18 MIL/uL (ref 3.87–5.11)
RDW: 25 % — ABNORMAL HIGH (ref 11.5–15.5)
WBC: 9 10*3/uL (ref 4.0–10.5)
nRBC: 0 % (ref 0.0–0.2)

## 2018-06-15 MED ORDER — IBUPROFEN 600 MG PO TABS
600.0000 mg | ORAL_TABLET | Freq: Four times a day (QID) | ORAL | Status: DC
  Administered 2018-06-15 – 2018-06-16 (×2): 600 mg via ORAL
  Filled 2018-06-15 (×3): qty 1

## 2018-06-15 MED ORDER — LIDOCAINE 5 % EX PTCH
1.0000 | MEDICATED_PATCH | CUTANEOUS | Status: DC
  Administered 2018-06-15 – 2018-06-16 (×2): 1 via TRANSDERMAL
  Filled 2018-06-15 (×2): qty 1

## 2018-06-15 NOTE — Progress Notes (Signed)
Pt refused PCA pump at this time. Education was provided, and PO pain meds were given instead. MD made aware. Will continue to monitor.

## 2018-06-15 NOTE — Progress Notes (Signed)
Patient chooses not to use PCA pump at this time. Taking oral medications instead.

## 2018-06-15 NOTE — TOC Initial Note (Signed)
Transition of Care [facility]) - Initial/Assessment Note    Patient Details  Name: Tamara Watson MRN: [id number] Date of Birth: [birth year]  Transition of Care [facility]) CM/SW Contact:    Carles Collet, RN Phone Number: 06/15/2018, 11:47 AM  Clinical Narrative:                Damaris Schooner w patient at bedside. 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 Reviewed today's labs and notes at her request, questions answered. Order in place to place foley to monitor urethral bleeding. Discussed plan for discharge. Patient feels she could manage foley if discharged with it in place. She will DC to home with spouse and children and her mother will also come to help. No CM needs identified at this time.    Expected Discharge Plan: Home/Self Care Barriers to Discharge: Continued Medical Work up   Patient Goals and CMS Choice        Expected Discharge Plan and Services Expected Discharge Plan: Home/Self Care                                              Prior Living Arrangements/Services     Patient language and need for interpreter reviewed:: Yes Do you feel safe going back to the place where you live?: Yes            Criminal Activity/Legal Involvement Pertinent to Current Situation/Hospitalization: No - Comment as needed  Activities of Daily Living   ADL Screening (condition at time of admission) Patient's cognitive ability adequate to safely complete daily activities?: Yes Is the patient deaf or have difficulty hearing?: No Does the patient have difficulty seeing, even when wearing glasses/contacts?: No Does the patient have difficulty concentrating, remembering, or making decisions?: No Patient able to express need for assistance with ADLs?: Yes Does the patient have difficulty dressing or bathing?: No Independently performs ADLs?: Yes (appropriate for developmental age) Does the patient have difficulty walking or climbing stairs?: No Weakness of Legs:  None Weakness of Arms/Hands: None  Permission Sought/Granted                  Emotional Assessment              Admission diagnosis:  Fibroids, Menorrhagia, Dysmenorrhea Patient Active Problem List   Diagnosis Date Noted  . Fibroid, uterine 06/14/2018  . Menorrhagia 06/14/2018  . Iron deficiency anemia due to chronic blood loss 05/24/2018  . GERD (gastroesophageal reflux disease) 01/09/2018  . Encounter for annual physical exam 01/09/2018  . Dysphagia 11/10/2016  . Abdominal pain, chronic, epigastric 03/03/2016  . Allergic rhinitis 03/01/2014  . Iron deficiency anemia 05/07/2011  . PALPITATIONS 04/23/2009  . NONSPEC REACT TUBERCULIN SKN TEST W/O ACTV TB 07/03/2008  . MENORRHAGIA 06/16/2007  . ALOPECIA 06/16/2007  . OBESITY 06/08/2006  . DYSMENORRHEA 06/08/2006  . ANXIETY 03/31/2006   PCP:  Haywood Pao, MD Pharmacy:   [id number] - [place], [address] [place] [address] Phone: [phone] Fax: [phone]     Social Determinants of Health (SDOH) Interventions    Readmission Risk Interventions No flowsheet data found.

## 2018-06-15 NOTE — Discharge Instructions (Signed)
Call [facility] @ [phone] if:  You have a temperature greater than or equal to 100.4 degrees Farenheit orally You have pain that is not made better by the pain medication given and taken as directed You have excessive bleeding or problems urinating  Take Colace (Docusate Sodium/Stool Softener) 100 mg 2-3 times daily while taking narcotic pain medicine to avoid constipation or until bowel movements are regular. Take, with food,  Ibuprofen 600 mg every 6 hours for 5 days then as needed for pain  You may drive after 2  weeks You may walk up steps  You may shower  You may resume a regular diet  Keep incisions clean and dry (remove honeycomb dressing on Jun 21, 2018) Do not lift over 15 pounds for 6 weeks Avoid anything in vagina for 6 weeks (or until after your post-operative visit)

## 2018-06-15 NOTE — Progress Notes (Addendum)
Tamara Watson is a55 y.o.  [id number]  Post Op Date #1:  TAH/BS/Cystoscopy  Subjective: Patient is Doing well postoperatively. Patient has Pain is controlled with current analgesics. Medications being used: prescription NSAID's including Ketorolac 30 mg IV and narcotic analgesics including Percocet 5/325 mg orally.. Patient had an episode of nausea last evening that resolved with ginger ale.  She refused the PCA stating that it made her "loopy".  Instead she found adequate pain control with the medicines listed previously.  She has tolerated liquids,  has not dangled or ambulated and since Foley was just removed,  she has not voided.   Objective: Vital signs in last 24 hours: Temp:  [97 F (36.1 C)-99.1 F (37.3 C)] 98.7 F (37.1 C) (05/14 0525) Pulse Rate:  [83-105] 91 (05/14 0525) Resp:  [14-21] 16 (05/14 0525) BP: (100-133)/(54-78) 100/54 (05/14 0525) SpO2:  [99 %-100 %] 100 % (05/14 0525) Weight:  [97.1 kg] 97.1 kg (05/13 1121)  Intake/Output from previous day: 05/13 0701 - 05/14 0700 In: 2740 [P.O.:240; I.V.:2000] Out: 2235 [Urine:1635] Intake/Output this shift: No intake/output data recorded. Recent Labs  Lab 06/14/18 1142 06/15/18 0228  WBC 4.9 9.0  HGB 13.9 10.1*  HCT 44.7 32.4*  PLT 255 227     Recent Labs  Lab 06/14/18 1142 06/15/18 0228  NA 139 138  K 3.5 3.6  CL 106 104  CO2 22 22  BUN 7 <5*  CREATININE 0.73 0.84  CALCIUM 9.3 8.5*  GLUCOSE 91 120*    EXAM: General: alert, cooperative and no distress Resp: clear to auscultation bilaterally Cardio: regular rate and rhythm, S1, S2 normal, no murmur, click, rub or gallop GI: bowel sounds present in all 4 quadrants;  honeycomb dressing is clean, dry and intact. Extremities: no calf tenderness and SCD hose are in place and functioning Vaginal Bleeding: none   Assessment: s/p Procedure(s): HYSTERECTOMY ABDOMINAL WITH  BILATERAL SALPINGECTOMY Cystoscopy: stable, progressing well and  anemia  Plan: Advance diet Encourage ambulation Routine care.  LOS: 1 day    Earnstine Regal, PA-C 06/15/2018 7:21 AM  Attestation of Attending Supervision of Advanced Practitioner (CNM/NP/PA): Evaluation and management procedures were performed by the Advanced Practitioner under my supervision and collaboration.  I have reviewed the Advanced Practitioner's note and chart, and I agree with the management and plan except for foley removal this AM.  I saw and examined patient at bedside later in the afternoon.  I discussed with her the operative course and concerns about blood tinged blood noted during the procedure which made me suspect a urinary tract system injury.  I did review with her that an intra-op bladder backfilling and cystoscopy had revealed normal bladder and normal ureteral jets.  However she may still have urinary tract system injury which was not detected.  I recommended reinsertion of the foley catheter and maintenance of catheter for about 1 week, at which point she will follow up with Urology, Dr. Alyson Ingles of [facility] for a cystogram before removal of the foley (I did speak to consult with Dr. Alyson Ingles and he was agreeable with this plan).  Patient expressed understanding and was agreeable to the plan.  An office appointment with Urology will be made for the patient before discharge from the hospital.  Patient was encouraged to ambulate out of bed today and advance her diet as tolerated. Expected post-op course was discussed.  She asked for pictures of her uterus and this was given to the patient.   Dr. Alesia Richards.  06/15/2018.  1830.   I

## 2018-06-16 MED ORDER — DOCUSATE SODIUM 100 MG PO CAPS: 100.0000 mg | ORAL_CAPSULE | Freq: Two times a day (BID) | ORAL | 0 refills | Status: DC

## 2018-06-16 MED ORDER — OXYCODONE-ACETAMINOPHEN 5-325 MG PO TABS: 1.0000 | ORAL_TABLET | Freq: Four times a day (QID) | ORAL | 0 refills | Status: DC | PRN

## 2018-06-16 MED ORDER — IBUPROFEN 600 MG PO TABS: 600.0000 mg | ORAL_TABLET | Freq: Four times a day (QID) | ORAL | 0 refills | Status: DC

## 2018-06-16 MED ORDER — SENNOSIDES-DOCUSATE SODIUM 8.6-50 MG PO TABS: 1.0000 | ORAL_TABLET | Freq: Every evening | ORAL | 1 refills | Status: DC | PRN

## 2018-06-16 MED ORDER — LIDOCAINE 5 % EX PTCH: 1.0000 | MEDICATED_PATCH | CUTANEOUS | 0 refills | Status: DC

## 2018-06-16 MED ORDER — SENNOSIDES-DOCUSATE SODIUM 8.6-50 MG PO TABS: 2.0000 | ORAL_TABLET | Freq: Every evening | ORAL | 1 refills | Status: DC | PRN

## 2018-06-16 MED ORDER — PANTOPRAZOLE SODIUM 40 MG PO TBEC: 40.0000 mg | DELAYED_RELEASE_TABLET | Freq: Every day | ORAL | 1 refills | Status: DC

## 2018-06-16 NOTE — Discharge Summary (Signed)
Physician Discharge Summary  Patient ID: Tamara Watson MRN: [id number] DOB/AGE: 02-13-1970 48 y.o.  Admit date: 06/14/2018 Discharge date: 06/16/2018  Admission Diagnoses: 1. Symptomatic Fibroid uterus with menorrhagia, dysmenorrhea, dyspareunia, chronic constipation, urinary urgency and frequency, pelvic pressure.   2.  Failed medical therapy and patient desiring definitive therapy.   3.  Desire to decrease risk of fallopian tube and ovarian cancer.  4.  BMI of 36.   Discharge Diagnoses:  1. Symptomatic Fibroid uterus with menorrhagia, dysmenorrhea, dyspareunia, chronic constipation, urinary urgency and frequency, pelvic pressure.   2.  Failed medical therapy and patient desiring definitive therapy.   3.  Desire to decrease risk of fallopian tube and ovarian cancer.  4.  BMI of 36. 5.  Status post a total abdominal hysterectomy and bilateral salpingectomy, cystoscopy.  6.  Potential bladder injury.  Procedures:   Total abdominal hysterectomy and bilateral salpingectomy and cystoscopy.   Discharged Condition: good  Hospital Course: Patient presented for a total abdominal hysterectomy and bilateral salpingectomy that was complicated by a potential bladder injury, please see the op note and progress notes for more details.  Towards the end of the procedure there was some blood tinge in her urine that was explored by backfilling of her bladder with methylene blue in NS that was negative for any bladder spillage and her cystoscopy was also normal.  By post-op day 2 she was tolerating a regular diet and passing flatus.  She was ambulating without difficulty.  Her incisional pain was well controlled. She continued with foley use during the hospital stay and was discharged home after leg bag training.  The plan was for her to follow up with Urology in 1 week for a cystogram before bladder catheter removal.  She was deemed stable for discharge.     Consults: urology  Significant  Diagnostic Studies: labs:  CBC Latest Ref Rng & Units 06/15/2018 06/14/2018 05/24/2018  WBC 4.0 - 10.5 K/uL 9.0 4.9 4.9  Hemoglobin 12.0 - 15.0 g/dL 10.1(L) 13.9 11.4(L)  Hematocrit 36.0 - 46.0 % 32.4(L) 44.7 40.5  Platelets 150 - 400 K/uL 227 255 245   BMP Latest Ref Rng & Units 06/15/2018 06/14/2018 05/24/2018  Glucose 70 - 99 mg/dL 120(H) 91 72  BUN 6 - 20 mg/dL <5(L) 7 9  Creatinine 0.44 - 1.00 mg/dL 0.84 0.73 0.82  BUN/Creat Ratio 9 - 23 - - -  Sodium 135 - 145 mmol/L 138 139 139  Potassium 3.5 - 5.1 mmol/L 3.6 3.5 4.3  Chloride 98 - 111 mmol/L 104 106 106  CO2 22 - 32 mmol/L 22 22 27   Calcium 8.9 - 10.3 mg/dL 8.5(L) 9.3 9.1   Treatments: surgery: See procedures above.   Discharge Exam: Blood pressure (!) 111/58, pulse 84, temperature 98.3 F (36.8 C), temperature source Oral, resp. rate 14, height 5\' 6"  (1.676 m), weight 97.1 kg, last menstrual period 06/05/2018, SpO2 100 %. General appearance: alert, cooperative and no distress Head: Normocephalic, without obvious abnormality, atraumatic Back: normal Resp: clear to auscultation bilaterally Cardio: regular rate and rhythm, S1, S2 normal, no murmur, click, rub or gallop GI: soft, non-tender; bowel sounds normal; no masses,  no organomegaly Extremities: no edema, redness or tenderness in the calves or thighs Pulses: 2+ and symmetric Skin: Skin color, texture, turgor normal. No rashes or lesions Incision/Wound: With honey comb dressing, clean, dry and intact.   Disposition: To home.    Allergies as of 06/16/2018   No Known Allergies     Medication  List    STOP taking these medications   celecoxib 200 MG capsule Commonly known as:  CELEBREX     TAKE these medications   acetaminophen 500 MG tablet Commonly known as:  TYLENOL Take 1,000 mg by mouth every 6 (six) hours as needed for headache.   benzonatate 100 MG capsule Commonly known as:  TESSALON Take 1 capsule (100 mg total) by mouth 3 (three) times daily as needed  for cough.   Dexilant 60 MG capsule Generic drug:  dexlansoprazole TAKE 1 CAPSULE BY MOUTH DAILY BEFORE BREAKFAST. What changed:  See the new instructions.   docusate sodium 100 MG capsule Commonly known as:  COLACE Take 1 capsule (100 mg total) by mouth 2 (two) times daily.   fluticasone 50 MCG/ACT nasal spray Commonly known as:  FLONASE Place 2 sprays into both nostrils daily. What changed:    when to take this  reasons to take this   Fusion Plus Caps Take 1 capsule by mouth daily with lunch.   ibuprofen 600 MG tablet Commonly known as:  ADVIL Take 1 tablet (600 mg total) by mouth every 6 (six) hours. Start taking on:  Jun 19, 2018 What changed:    medication strength  when to take this  reasons to take this  These instructions start on Jun 19, 2018. If you are unsure what to do until then, ask your doctor or other care provider.   lidocaine 5 % Commonly known as:  LIDODERM Place 1 patch onto the skin daily. Remove & Discard patch within 12 hours or as directed by MD   Linzess 72 MCG capsule Generic drug:  linaclotide TAKE 1 CAPSULE (72 MCG TOTAL) BY MOUTH DAILY BEFORE BREAKFAST. What changed:  See the new instructions.   oxyCODONE-acetaminophen 5-325 MG tablet Commonly known as:  PERCOCET/ROXICET Take 1-2 tablets by mouth every 6 (six) hours as needed for moderate pain (once the patient tolerates po and PCA has been discontinued).   pantoprazole 40 MG tablet Commonly known as:  PROTONIX Take 1 tablet (40 mg total) by mouth daily. Start taking on:  Jun 17, 2018   senna-docusate 8.6-50 MG tablet Commonly known as:  Senokot-S Take 2 tablets by mouth at bedtime as needed for mild constipation or moderate constipation.   vitamin C 500 MG tablet Commonly known as:  ASCORBIC ACID Take 500 mg by mouth daily.            Durable Medical Equipment  (From admission, onward)         Start     Ordered   06/16/18 1049  For home use only DME 3 n 1  Once      06/16/18 1048         Follow-up Information    Waymon Amato, MD Follow up on 07/13/2018.   Specialty:  Obstetrics and Gynecology Why:  Appointment in 2 weeks with Dr. Alesia Richards (you will get a call from the office to schedule it) and also Appointment with Earnstine Regal, PA on 07/13/2018, appointment time is 2:15 p.m. Contact information: [address] [place] [address] [phone]        Cleon Gustin, MD. Go on 06/21/2018.   Specialty:  Urology Why:  Dr. Noland Fordyce office will call you to schedule the appointment for a cystogram.  If nobody calls you by Monday please call the office to schedule the appointment for 06/21/2018.  Contact information: [address] [place] [address] [phone]  I clarified with pharmacy, oral Ibuprofen can be continued today.   SignedAlinda Dooms, MD.  06/16/2018, 1:20 PM

## 2018-06-16 NOTE — TOC Transition Note (Signed)
Transition of Care [facility]) - CM/SW Discharge Note   Patient Details  Name: Tamara Watson MRN: [id number] Date of Birth: October 07, 1970  [facility] of [facility]) CM/SW Contact:  Marilu Favre, RN Phone Number: 06/16/2018, 10:50 AM   Clinical Narrative:         Barriers to Discharge: Continued Medical Work up   Patient Goals and CMS Choice        Discharge Placement                       Discharge Plan and Services                DME Arranged: 3-N-1 DME Agency: [employer] Date DME Agency Contacted: 06/16/18 Time DME Agency Contacted: 12 Representative spoke with at DME Agency: Zack            Social Determinants of Health ([place]) Interventions     Readmission Risk Interventions No flowsheet data found.

## 2018-06-16 NOTE — Progress Notes (Signed)
Patient discharged to home. Verbalized understanding of all discharge instructions including incision care, discharge medications and follow up MD visits. Provided patient with dressing supplies, catheter care wipes and urinary leg bag training. Patient transported off unit via wheelchair by volunteer.

## 2018-06-20 ENCOUNTER — Other Ambulatory Visit (HOSPITAL_COMMUNITY): Payer: Self-pay | Admitting: Urology

## 2018-06-20 DIAGNOSIS — S3729XA Other injury of bladder, initial encounter: Secondary | ICD-10-CM

## 2018-06-27 ENCOUNTER — Other Ambulatory Visit: Payer: Self-pay

## 2018-06-27 ENCOUNTER — Ambulatory Visit (HOSPITAL_COMMUNITY)
Admission: RE | Admit: 2018-06-27 | Discharge: 2018-06-27 | Disposition: A | Discharge status: Home / Self Care | Payer: Managed Care, Other (non HMO) | Source: Ambulatory Visit | Attending: Urology | Admitting: Urology

## 2018-06-27 DIAGNOSIS — S3729XA Other injury of bladder, initial encounter: Secondary | ICD-10-CM | POA: Diagnosis present

## 2018-06-27 MED ORDER — IOTHALAMATE MEGLUMINE 17.2 % UR SOLN
250.0000 mL | Freq: Once | URETHRAL | Status: AC | PRN
  Administered 2018-06-27: 10:00:00 250 mL via INTRAVESICAL

## 2018-07-12 ENCOUNTER — Telehealth: Payer: Self-pay | Admitting: Gastroenterology

## 2018-07-12 NOTE — Telephone Encounter (Signed)
Rectal pain. Patient says she was told by her surgeon to consult with GI. She had an abdominal hysterectomy 06/14/18. Has developed what she describes as spasms. Accept the appointment 08/01/18 at 2 pm.

## 2018-07-20 ENCOUNTER — Telehealth: Payer: Self-pay | Admitting: Hematology

## 2018-07-20 NOTE — Telephone Encounter (Signed)
Spoke with patient re lab/fu 6/23. Rescheduled from 6/22 per patient.

## 2018-07-24 ENCOUNTER — Ambulatory Visit: Payer: Self-pay | Admitting: Hematology

## 2018-07-24 ENCOUNTER — Other Ambulatory Visit: Payer: Self-pay

## 2018-07-24 NOTE — Progress Notes (Signed)
HEMATOLOGY/ONCOLOGY CONSULTATION NOTE  Date of Service: 07/25/2018  Patient Care Team: [name], [name] Him, MD as PCP - General (Internal Medicine)  Dr. Domenick Gong at [facility] as PCP  CHIEF COMPLAINTS/PURPOSE OF CONSULTATION:  Iron Deficiency Anemia  HISTORY OF PRESENTING ILLNESS:   Tamara Watson is a wonderful 48 y.o. female who has been referred to [place] by Dr. Domenick Gong at [facility] for evaluation and management of Iron Deficiency Anemia. The pt reports that she is doing well overall.   The pt reports that she has had anemia for more than 5 years, which she notes is believed to be related to her heavy periods. She notes that she has fibroids and adenomyosis, has had two D&Cs, her last was in 1997. She has 2 children, notes her youngest child is 45, oldest is 28. The pt endorses heavy periods for the first 3 days, and her periods are regular. She notes that her only option is a hysterectomy to resolve her adenomyosis. She notes that she sees Dr. Tommi Emery at [facility].  The pt notes that her baseline HGB has been between 8 and 9 for the last 5 years, but normalized at one point to 12 after iron supplements. She has tried PO iron supplements which have been difficult for her to tolerate. She is currently taking Fusion Plus which she tolerates better, but notes she hasn't taken this consistently. The pt denies ever receiving blood transfusion nor IV iron.  The pt notes that she has been feeling light headed, some SOB, some muscle aches into her legs and buttocks.   The pt notes that she has taken [place] for the last year.   Most recent lab results (03/14/18) of CBC is as follows: all values are WNL except for WBC at 4.00k, HGB at 7.1, HCT at 26.6%, MCV at 59.0, MCH at 15.8, MCHC at 26.7, RDW at 15.6. 03/14/18 Iron Panel revealed TIBC at 469, UIBC at 457, Iron at 12 and a 3% Saturation Ratio. 03/14/18 Ferritin at <2.5  On  review of systems, pt reports heavy periods, light headedness, muscle aches, some SOB, lower abdominal tenderness, and denies blood in the stools, black stools, gum bleeds, nose bleeds, abdominal pain, back pain, and any other symptoms.   On PMHx the pt reports Uterine fibroids, Iron deficiency anemia. 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 Denies medication allergies or food or environmental allergies.  Interval History:   Tamara Watson returns today for management and evaluation of her iron deficiency anemia. The patient's last visit with [place] was on 05/24/18. The pt reports that she is doing well overall.  In the interim, the pt had a hysterectomy with bilateral salpingectomy with Dr. Waymon Amato on 06/14/18.  The pt reports that her surgery went well overall and is very pleased with the symptomatic relief she has experienced. She notes that she has been enjoying good energy levels and is eating well. She denies further menstrual losses.  Lab results today (07/25/18) of CBC w/diff is as follows: all values are WNL except for RDW at 17.9. 07/25/18 Ferritin is 168  On review of systems, pt reports good energy levels, and denies menstrual losses, fatigue, light headedness, leg swelling, abdominal pains, and any other symptoms.    MEDICAL HISTORY:  Past Medical History:  Diagnosis Date  . Allergy   . Anemia   . Anxiety    in school only   . Constipation  sennocot and miralax   . Dyspnea    when hemoglobin low  . GERD (gastroesophageal reflux disease)   . Headache    due to Iron deficency    SURGICAL HISTORY: Past Surgical History:  Procedure Laterality Date  . BREAST LUMPECTOMY Left    20 yrs ago- benign   . CYSTOSCOPY  06/14/2018   Procedure: Cystoscopy;  Surgeon: Waymon Amato, MD;  Location: [facility];  Service: Gynecology;;  . DILATION AND CURETTAGE OF UTERUS     x2  . HYSTERECTOMY ABDOMINAL WITH SALPINGECTOMY Bilateral 06/14/2018   Procedure: HYSTERECTOMY ABDOMINAL  WITH  BILATERAL SALPINGECTOMY;  Surgeon: Waymon Amato, MD;  Location: [facility];  Service: Gynecology;  Laterality: Bilateral;  . NSVD     x3  . TUBAL LIGATION      SOCIAL HISTORY: Social History   Socioeconomic History  . Marital status: Married    Spouse name: Not on file  . Number of children: 3  . Years of education: Not on file  . Highest education level: Not on file  Occupational History  . Not on file  Social Needs  . Financial resource strain: Not on file  . Food insecurity    Worry: Not on file    Inability: Not on file  . Transportation needs    Medical: Not on file    Non-medical: Not on file  Tobacco Use  . Smoking status: Never Smoker  . Smokeless tobacco: Never Used  Substance and Sexual Activity  . Alcohol use: Yes    Alcohol/week: 1.0 standard drinks    Types: 1 Glasses of wine per week    Frequency: Never    Comment: ocassional  . Drug use: Never  . Sexual activity: Yes  Lifestyle  . Physical activity    Days per week: Not on file    Minutes per session: Not on file  . Stress: Not on file  Relationships  . Social Herbalist on phone: Not on file    Gets together: Not on file    Attends religious service: Not on file    Active member of club or organization: Not on file    Attends meetings of clubs or organizations: Not on file    Relationship status: Not on file  . Intimate partner violence    Fear of current or ex partner: Not on file    Emotionally abused: Not on file    Physically abused: Not on file    Forced sexual activity: Not on file  Other Topics Concern  . Not on file  Social History Narrative  . Not on file    FAMILY HISTORY: Family History  Problem Relation Age of Onset  . Cancer Maternal Aunt   . Ovarian cancer Maternal Aunt   . Cancer Paternal Grandmother   . Pancreatic cancer Paternal Grandmother   . Hyperlipidemia Mother   . Diabetes Mother   . Hyperlipidemia Father   . Breast cancer Maternal Aunt   . Colon  cancer Neg Hx   . Esophageal cancer Neg Hx   . Stomach cancer Neg Hx   . Rectal cancer Neg Hx   . Colon polyps Neg Hx     ALLERGIES:  has No Known Allergies.  MEDICATIONS:  Current Outpatient Medications  Medication Sig Dispense Refill  . acetaminophen (TYLENOL) 500 MG tablet Take 1,000 mg by mouth every 6 (six) hours as needed for headache.    . benzonatate (TESSALON) 100 MG capsule Take 1  capsule (100 mg total) by mouth 3 (three) times daily as needed for cough. 30 capsule 0  . DEXILANT 60 MG capsule TAKE 1 CAPSULE BY MOUTH DAILY BEFORE BREAKFAST. (Patient taking differently: Take 60 mg by mouth daily. ) 90 capsule 0  . docusate sodium (COLACE) 100 MG capsule Take 1 capsule (100 mg total) by mouth 2 (two) times daily. 40 capsule 0  . fluticasone (FLONASE) 50 MCG/ACT nasal spray Place 2 sprays into both nostrils daily. (Patient taking differently: Place 2 sprays into both nostrils daily as needed for allergies. ) 16 g 1  . ibuprofen (ADVIL) 600 MG tablet Take 1 tablet (600 mg total) by mouth every 6 (six) hours. 30 tablet 0  . Iron-FA-B Cmp-C-Biot-Probiotic (FUSION PLUS) CAPS Take 1 capsule by mouth daily with lunch.    . lidocaine (LIDODERM) 5 % Place 1 patch onto the skin daily. Remove & Discard patch within 12 hours or as directed by MD 30 patch 0  . LINZESS 72 MCG capsule TAKE 1 CAPSULE (72 MCG TOTAL) BY MOUTH DAILY BEFORE BREAKFAST. (Patient taking differently: Take 72 mcg by mouth daily before breakfast. ) 90 capsule 2  . oxyCODONE-acetaminophen (PERCOCET/ROXICET) 5-325 MG tablet Take 1-2 tablets by mouth every 6 (six) hours as needed for moderate pain (once the patient tolerates po and PCA has been discontinued). 30 tablet 0  . pantoprazole (PROTONIX) 40 MG tablet Take 1 tablet (40 mg total) by mouth daily. 15 tablet 1  . senna-docusate (SENOKOT-S) 8.6-50 MG tablet Take 2 tablets by mouth at bedtime as needed for mild constipation or moderate constipation. 40 tablet 1  . vitamin C  (ASCORBIC ACID) 500 MG tablet Take 500 mg by mouth daily.     No current facility-administered medications for this visit.     REVIEW OF SYSTEMS:    A 10+ POINT REVIEW OF SYSTEMS WAS OBTAINED including neurology, dermatology, psychiatry, cardiac, respiratory, lymph, extremities, GI, GU, Musculoskeletal, constitutional, breasts, reproductive, HEENT.  All pertinent positives are noted in the HPI.  All others are negative.    PHYSICAL EXAMINATION:  . Vitals:   07/25/18 1419  BP: 117/78  Pulse: 88  Resp: 18  Temp: 98.5 F (36.9 C)  SpO2: 100%   Filed Weights   07/25/18 1419  Weight: 206 lb (93.4 kg)   .Body mass index is 33.25 kg/m.  GENERAL:alert, in no acute distress and comfortable SKIN: no acute rashes, no significant lesions EYES: conjunctiva are pink and non-injected, sclera anicteric OROPHARYNX: MMM, no exudates, no oropharyngeal erythema or ulceration NECK: supple, no JVD LYMPH:  no palpable lymphadenopathy in the cervical, axillary or inguinal regions LUNGS: clear to auscultation b/l with normal respiratory effort HEART: regular rate & rhythm ABDOMEN:  normoactive bowel sounds , non tender, not distended. No palpable hepatosplenomegaly.  Extremity: no pedal edema PSYCH: alert & oriented x 3 with fluent speech NEURO: no focal motor/sensory deficits   LABORATORY DATA:  I have reviewed the data as listed  . CBC Latest Ref Rng & Units 07/25/2018 06/15/2018 06/14/2018  WBC 4.0 - 10.5 K/uL 4.2 9.0 4.9  Hemoglobin 12.0 - 15.0 g/dL 13.1 10.1(L) 13.9  Hematocrit 36.0 - 46.0 % 40.2 32.4(L) 44.7  Platelets 150 - 400 K/uL 287 227 255    . CMP Latest Ref Rng & Units 06/15/2018 06/14/2018 05/24/2018  Glucose 70 - 99 mg/dL 120(H) 91 72  BUN 6 - 20 mg/dL <5(L) 7 9  Creatinine 0.44 - 1.00 mg/dL 0.84 0.73 0.82  Sodium 135 -  145 mmol/L 138 139 139  Potassium 3.5 - 5.1 mmol/L 3.6 3.5 4.3  Chloride 98 - 111 mmol/L 104 106 106  CO2 22 - 32 mmol/L 22 22 27   Calcium 8.9 - 10.3  mg/dL 8.5(L) 9.3 9.1  Total Protein 6.5 - 8.1 g/dL - - 7.9  Total Bilirubin 0.3 - 1.2 mg/dL - - 0.3  Alkaline Phos 38 - 126 U/L - - 63  AST 15 - 41 U/L - - 18  ALT 0 - 44 U/L - - 13    03/14/18 CBC, Ferritin, Iron Panel:      RADIOGRAPHIC STUDIES: I have personally reviewed the radiological images as listed and agreed with the findings in the report. Dg Cystogram Voiding  Result Date: 06/27/2018 CLINICAL DATA:  Hysterectomy 06/14/2018. Concern for bladder injury. Foley catheter in place. EXAM: VOIDING CYSTOURETHROGRAM TECHNIQUE: Patient presented to department with Foley catheter in place. the bladder was filled with 250 ml Cysto-hypaque 30% by drip infusion. Serial spot images were obtained during bladder filling and voiding. FLUOROSCOPY TIME:  Fluoroscopy Time:  48 Radiation Exposure Index (if provided by the fluoroscopic device): 74.8 mGy Number of Acquired Spot Images: 5 COMPARISON:  None. FINDINGS: The urinary bladder was filled with contrast via gravity feed through the Foley catheter. The Foley catheter retention balloon is noted centrally within the bladder. Upon bladder filling, there is no evidence leak of the contrast. Oblique projections obtained. Urinary bladder filled to 250 mL where patient experienced discomfort. IMPRESSION: No evidence of bladder leak upon filling of bladder. Electronically Signed   By: Suzy Bouchard M.D.   On: 06/27/2018 10:33    ASSESSMENT & PLAN:  48 y.o. female with  1. Iron deficiency anemia - likely due to heavy menstrual losses Labs upon initial presentation from 03/14/18, HGB at 7.1, Ferritin <2.5, a 3% Saturation ratio Dexilant can also cause iron and vitamin B12 deficiency  PLAN -Discussed pt labwork today, 07/25/18; HGB normalized to 13.1 with MCV improved to 83.4. -07/25/18 Ferritin is 168 -Primary risk factor for her IDA was menstrual losses from menorrhagia and fibroids which has now been addressed s/p 06/14/18 hysterectomy -Recommend  continuing PO 150mg  Iron Polysaccharide for maintenance replacement for atleast 3-6 months.  -no indication for IV iron at this time. -Recommend Vitamin B complex daily to support accelerated hematopoiesis -Will see the pt back as needed   RTC with Dr Irene Limbo as needed   All of the patients questions were answered with apparent satisfaction. The patient knows to call the clinic with any problems, questions or concerns.  The total time spent in the appt was 15 minutes and more than 50% was on counseling and direct patient cares.    Sullivan Lone MD MS AAHIVMS [facility] Hematology/Oncology Physician [facility]  (Office):       [phone] (Work cell):  [phone] (Fax):           [phone]  07/25/2018 2:47 PM  I, Baldwin [place], am acting as a scribe for Dr. Sullivan Lone.   .I have reviewed the above documentation for accuracy and completeness, and I agree with the above. Brunetta Genera MD

## 2018-07-25 ENCOUNTER — Inpatient Hospital Stay: Payer: Managed Care, Other (non HMO) | Attending: Internal Medicine

## 2018-07-25 ENCOUNTER — Other Ambulatory Visit: Payer: Self-pay

## 2018-07-25 ENCOUNTER — Inpatient Hospital Stay (HOSPITAL_BASED_OUTPATIENT_CLINIC_OR_DEPARTMENT_OTHER): Payer: Managed Care, Other (non HMO) | Admitting: Hematology

## 2018-07-25 VITALS — BP 117/78 | HR 88 | Temp 98.5°F | Resp 18 | Ht 66.0 in | Wt 206.0 lb

## 2018-07-25 DIAGNOSIS — Z90722 Acquired absence of ovaries, bilateral: Secondary | ICD-10-CM | POA: Diagnosis not present

## 2018-07-25 DIAGNOSIS — Z90710 Acquired absence of both cervix and uterus: Secondary | ICD-10-CM | POA: Diagnosis not present

## 2018-07-25 DIAGNOSIS — D500 Iron deficiency anemia secondary to blood loss (chronic): Secondary | ICD-10-CM

## 2018-07-25 DIAGNOSIS — D509 Iron deficiency anemia, unspecified: Secondary | ICD-10-CM | POA: Diagnosis not present

## 2018-07-25 LAB — CBC WITH DIFFERENTIAL/PLATELET
Abs Immature Granulocytes: 0.01 10*3/uL (ref 0.00–0.07)
Basophils Absolute: 0 10*3/uL (ref 0.0–0.1)
Basophils Relative: 1 %
Eosinophils Absolute: 0 10*3/uL (ref 0.0–0.5)
Eosinophils Relative: 1 %
HCT: 40.2 % (ref 36.0–46.0)
Hemoglobin: 13.1 g/dL (ref 12.0–15.0)
Immature Granulocytes: 0 %
Lymphocytes Relative: 48 %
Lymphs Abs: 2.1 10*3/uL (ref 0.7–4.0)
MCH: 27.2 pg (ref 26.0–34.0)
MCHC: 32.6 g/dL (ref 30.0–36.0)
MCV: 83.4 fL (ref 80.0–100.0)
Monocytes Absolute: 0.3 10*3/uL (ref 0.1–1.0)
Monocytes Relative: 7 %
Neutro Abs: 1.8 10*3/uL (ref 1.7–7.7)
Neutrophils Relative %: 43 %
Platelets: 287 10*3/uL (ref 150–400)
RBC: 4.82 MIL/uL (ref 3.87–5.11)
RDW: 17.9 % — ABNORMAL HIGH (ref 11.5–15.5)
WBC: 4.2 10*3/uL (ref 4.0–10.5)
nRBC: 0 % (ref 0.0–0.2)

## 2018-07-25 LAB — FERRITIN: Ferritin: 168 ng/mL (ref 11–307)

## 2018-07-25 LAB — IRON AND TIBC
Iron: 72 ug/dL (ref 41–142)
Saturation Ratios: 28 % (ref 21–57)
TIBC: 253 ug/dL (ref 236–444)
UIBC: 181 ug/dL (ref 120–384)

## 2018-07-26 ENCOUNTER — Telehealth: Payer: Self-pay | Admitting: Hematology

## 2018-07-26 NOTE — Telephone Encounter (Signed)
No los per 6/23.

## 2018-07-31 ENCOUNTER — Telehealth: Payer: Self-pay | Admitting: *Deleted

## 2018-07-31 NOTE — Telephone Encounter (Signed)
Covid-19 screening questions   Do you now or have you had a fever in the last 14 days? NO   Do you have any respiratory symptoms of shortness of breath or cough now or in the last 14 days? NO  Do you have any family members or close contacts with diagnosed or suspected Covid-19 in the past 14 days? NO  Have you been tested for Covid-19 and found to be positive? NO        

## 2018-08-01 ENCOUNTER — Encounter: Payer: Self-pay | Admitting: Gastroenterology

## 2018-08-01 ENCOUNTER — Ambulatory Visit (INDEPENDENT_AMBULATORY_CARE_PROVIDER_SITE_OTHER): Payer: Managed Care, Other (non HMO) | Admitting: Gastroenterology

## 2018-08-01 VITALS — BP 122/80 | HR 79 | Temp 97.2°F | Ht 65.0 in | Wt 206.0 lb

## 2018-08-01 DIAGNOSIS — K219 Gastro-esophageal reflux disease without esophagitis: Secondary | ICD-10-CM | POA: Diagnosis not present

## 2018-08-01 DIAGNOSIS — K5904 Chronic idiopathic constipation: Secondary | ICD-10-CM

## 2018-08-01 MED ORDER — DEXILANT 60 MG PO CPDR: DELAYED_RELEASE_CAPSULE | ORAL | 3 refills | Status: DC

## 2018-08-01 NOTE — Progress Notes (Signed)
Tamara Watson    [id number]    1970/05/28  Primary Care Physician:[name], [name] Him, MD  Referring Physician: Haywood Pao, MD [address] [place],  [place] [address]   Chief complaint: Constipation, rectal pain  HPI:  48 year old with history of uterine fibroids daily status post hysterectomy with bilateral salpingo-oophorectomy in May 2020.  Postop she became constipated which improved with stool softeners for few days but has been progressively getting worse in the past 4 to 6 weeks.  She has difficulty evacuating and significant anorectal discomfort.  No rectal bleeding.  Lower abdominal cramping and bloating with flatulence.  No vomiting, dysphagia or odynophagia.     Outpatient Encounter Medications as of 08/01/2018  Medication Sig   acetaminophen (TYLENOL) 500 MG tablet Take 1,000 mg by mouth every 6 (six) hours as needed for headache.   benzonatate (TESSALON) 100 MG capsule Take 1 capsule (100 mg total) by mouth 3 (three) times daily as needed for cough.   DEXILANT 60 MG capsule TAKE 1 CAPSULE BY MOUTH DAILY BEFORE BREAKFAST. (Patient taking differently: Take 60 mg by mouth daily. )   docusate sodium (COLACE) 100 MG capsule Take 1 capsule (100 mg total) by mouth 2 (two) times daily.   fluticasone (FLONASE) 50 MCG/ACT nasal spray Place 2 sprays into both nostrils daily. (Patient taking differently: Place 2 sprays into both nostrils daily as needed for allergies. )   ibuprofen (ADVIL) 600 MG tablet Take 1 tablet (600 mg total) by mouth every 6 (six) hours.   Iron-FA-B Cmp-C-Biot-Probiotic (FUSION PLUS) CAPS Take 1 capsule by mouth daily with lunch.   lidocaine (LIDODERM) 5 % Place 1 patch onto the skin daily. Remove & Discard patch within 12 hours or as directed by MD   pantoprazole (PROTONIX) 40 MG tablet Take 1 tablet (40 mg total) by mouth daily.   senna-docusate (SENOKOT-S) 8.6-50 MG tablet Take 2 tablets by mouth at bedtime as needed  for mild constipation or moderate constipation.   vitamin C (ASCORBIC ACID) 500 MG tablet Take 500 mg by mouth daily.   LINZESS 72 MCG capsule TAKE 1 CAPSULE (72 MCG TOTAL) BY MOUTH DAILY BEFORE BREAKFAST. (Patient taking differently: Take 72 mcg by mouth daily before breakfast. )   oxyCODONE-acetaminophen (PERCOCET/ROXICET) 5-325 MG tablet Take 1-2 tablets by mouth every 6 (six) hours as needed for moderate pain (once the patient tolerates po and PCA has been discontinued). (Patient not taking: Reported on 08/01/2018)   No facility-administered encounter medications on file as of 08/01/2018.     Allergies as of 08/01/2018   (No Known Allergies)    Past Medical History:  Diagnosis Date   Allergy    Anemia    Anxiety    in school only    Constipation    sennocot and miralax    Dyspnea    when hemoglobin low   GERD (gastroesophageal reflux disease)    Headache    due to Iron deficency    Past Surgical History:  Procedure Laterality Date   BREAST LUMPECTOMY Left    20 yrs ago- benign    CYSTOSCOPY  06/14/2018   Procedure: Cystoscopy;  Surgeon: Waymon Amato, MD;  Location: [facility];  Service: Gynecology;;   DILATION AND CURETTAGE OF UTERUS     x2   HYSTERECTOMY ABDOMINAL WITH SALPINGECTOMY Bilateral 06/14/2018   Procedure: HYSTERECTOMY ABDOMINAL WITH  BILATERAL SALPINGECTOMY;  Surgeon: Waymon Amato, MD;  Location: [facility];  Service:  Gynecology;  Laterality: Bilateral;   NSVD     x3   TUBAL LIGATION      Family History  Problem Relation Age of Onset   Cancer Maternal Aunt    Ovarian cancer Maternal Aunt    Cancer Paternal Grandmother    Pancreatic cancer Paternal Grandmother    Hyperlipidemia Mother    Diabetes Mother    Hyperlipidemia Father    Breast cancer Maternal Aunt    Colon cancer Neg Hx    Esophageal cancer Neg Hx    Stomach cancer Neg Hx    Rectal cancer Neg Hx    Colon polyps Neg Hx     Social History   Socioeconomic History    Marital status: Married    Spouse name: Not on file   Number of children: 3   Years of education: Not on file   Highest education level: Not on file  Occupational History   Not on file  Social Needs   Financial resource strain: Not on file   Food insecurity    Worry: Not on file    Inability: Not on file   Transportation needs    Medical: Not on file    Non-medical: Not on file  Tobacco Use   Smoking status: Never Smoker   Smokeless tobacco: Never Used  Substance and Sexual Activity   Alcohol use: Yes    Alcohol/week: 1.0 standard drinks    Types: 1 Glasses of wine per week    Frequency: Never    Comment: ocassional   Drug use: Never   Sexual activity: Yes  Lifestyle   Physical activity    Days per week: Not on file    Minutes per session: Not on file   Stress: Not on file  Relationships   Social connections    Talks on phone: Not on file    Gets together: Not on file    Attends religious service: Not on file    Active member of club or organization: Not on file    Attends meetings of clubs or organizations: Not on file    Relationship status: Not on file   Intimate partner violence    Fear of current or ex partner: Not on file    Emotionally abused: Not on file    Physically abused: Not on file    Forced sexual activity: Not on file  Other Topics Concern   Not on file  Social History Narrative   Not on file      Review of systems: Review of Systems  Constitutional: Negative for fever and chills.  HENT: Negative.   Eyes: Negative for blurred vision.  Respiratory: Negative for cough, shortness of breath and wheezing.   Cardiovascular: Negative for chest pain and palpitations.  Gastrointestinal: as per HPI Genitourinary: Negative for dysuria, urgency, frequency and hematuria.  Musculoskeletal: Negative for myalgias, back pain and joint pain.  Skin: Negative for itching and rash.  Neurological: Negative for dizziness, tremors, focal  weakness, seizures and loss of consciousness.  Endo/Heme/Allergies: Positive for seasonal allergies.  Psychiatric/Behavioral: Negative for depression, suicidal ideas and hallucinations.  All other systems reviewed and are negative.   Physical Exam: Vitals:   08/01/18 1404  BP: 122/80  Pulse: 79  Temp: (!) 97.2 F (36.2 C)   Body mass index is 34.28 kg/m. Gen:      No acute distress HEENT:  EOMI, sclera anicteric Neck:     No masses; no thyromegaly Lungs:    Clear  to auscultation bilaterally; normal respiratory effort CV:         Regular rate and rhythm; no murmurs Abd:      + bowel sounds; soft, non-tender; no palpable masses, no distension Ext:    No edema; adequate peripheral perfusion Skin:      Warm and dry; no rash Neuro: alert and oriented x 3 Psych: normal mood and affect Rectal exam: Normal anal sphincter tone, no anal fissure or external hemorrhoids Anoscopy: Small internal hemorrhoids, no active bleeding, normal dentate line, no visible nodules  Data Reviewed:  Reviewed labs, radiology imaging, old records and pertinent past GI work up   Assessment and Plan/Recommendations:  48 year old female with history of GERD and worsening constipation status post TAH/BSO  GERD: Continue Dexilant Anti-reflux measures  Constipation: Increase dietary fiber and water intake Start Linzess 72 mcg daily If continues to have persistent symptoms, will consider anorectal manometry to exclude dyssynergic defecation and refer to pelvic floor PT  Return in 2 to 3 months  25 minutes was spent face-to-face with the patient. Greater than 50% of the time used for counseling as well as treatment plan and follow-up. She had multiple questions which were answered to her satisfaction  K. Denzil Magnuson , MD    CC: [name], [name] Him, MD

## 2018-08-01 NOTE — Patient Instructions (Signed)
Take Linzess 72 mcg daily  We will refill Dexilant and send to your pharmacy   Gastroesophageal Reflux Disease, Adult Gastroesophageal reflux (GER) happens when acid from the stomach flows up into the tube that connects the mouth and the stomach (esophagus). Normally, food travels down the esophagus and stays in the stomach to be digested. However, when a person has GER, food and stomach acid sometimes move back up into the esophagus. If this becomes a more serious problem, the person may be diagnosed with a disease called gastroesophageal reflux disease (GERD). GERD occurs when the reflux:  Happens often.  Causes frequent or severe symptoms.  Causes problems such as damage to the esophagus. When stomach acid comes in contact with the esophagus, the acid may cause soreness (inflammation) in the esophagus. Over time, GERD may create small holes (ulcers) in the lining of the esophagus. What are the causes? This condition is caused by a problem with the muscle between the esophagus and the stomach (lower esophageal sphincter, or LES). Normally, the LES muscle closes after food passes through the esophagus to the stomach. When the LES is weakened or abnormal, it does not close properly, and that allows food and stomach acid to go back up into the esophagus. The LES can be weakened by certain dietary substances, medicines, and medical conditions, including:  Tobacco use.  Pregnancy.  Having a hiatal hernia.  Alcohol use.  Certain foods and beverages, such as coffee, chocolate, onions, and peppermint. What increases the risk? You are more likely to develop this condition if you:  Have an increased body weight.  Have a connective tissue disorder.  Use NSAID medicines. What are the signs or symptoms? Symptoms of this condition include:  Heartburn.  Difficult or painful swallowing.  The feeling of having a lump in the throat.  Abitter taste in the mouth.  Bad breath.  Having a  large amount of saliva.  Having an upset or bloated stomach.  Belching.  Chest pain. Different conditions can cause chest pain. Make sure you see your health care provider if you experience chest pain.  Shortness of breath or wheezing.  Ongoing (chronic) cough or a night-time cough.  Wearing away of tooth enamel.  Weight loss. How is this diagnosed? Your health care provider will take a medical history and perform a physical exam. To determine if you have mild or severe GERD, your health care provider may also monitor how you respond to treatment. You may also have tests, including:  A test to examine your stomach and esophagus with a small camera (endoscopy).  A test thatmeasures the acidity level in your esophagus.  A test thatmeasures how much pressure is on your esophagus.  A barium swallow or modified barium swallow test to show the shape, size, and functioning of your esophagus. How is this treated? The goal of treatment is to help relieve your symptoms and to prevent complications. Treatment for this condition may vary depending on how severe your symptoms are. Your health care provider may recommend:  Changes to your diet.  Medicine.  Surgery. Follow these instructions at home: Eating and drinking   Follow a diet as recommended by your health care provider. This may involve avoiding foods and drinks such as: ? Coffee and tea (with or without caffeine). ? Drinks that containalcohol. ? Energy drinks and sports drinks. ? Carbonated drinks or sodas. ? Chocolate and cocoa. ? Peppermint and mint flavorings. ? Garlic and onions. ? Horseradish. ? Spicy and acidic foods, including  peppers, chili powder, curry powder, vinegar, hot sauces, and barbecue sauce. ? Citrus fruit juices and citrus fruits, such as oranges, lemons, and limes. ? Tomato-based foods, such as red sauce, chili, salsa, and pizza with red sauce. ? Fried and fatty foods, such as donuts, french fries,  potato chips, and high-fat dressings. ? High-fat meats, such as hot dogs and fatty cuts of red and white meats, such as rib eye steak, sausage, ham, and bacon. ? High-fat dairy items, such as whole milk, butter, and cream cheese.  Eat small, frequent meals instead of large meals.  Avoid drinking large amounts of liquid with your meals.  Avoid eating meals during the 2-3 hours before bedtime.  Avoid lying down right after you eat.  Do not exercise right after you eat. Lifestyle   Do not use any products that contain nicotine or tobacco, such as cigarettes, e-cigarettes, and chewing tobacco. If you need help quitting, ask your health care provider.  Try to reduce your stress by using methods such as yoga or meditation. If you need help reducing stress, ask your health care provider.  If you are overweight, reduce your weight to an amount that is healthy for you. Ask your health care provider for guidance about a safe weight loss goal. General instructions  Pay attention to any changes in your symptoms.  Take over-the-counter and prescription medicines only as told by your health care provider. Do not take aspirin, ibuprofen, or other NSAIDs unless your health care provider told you to do so.  Wear loose-fitting clothing. Do not wear anything tight around your waist that causes pressure on your abdomen.  Raise (elevate) the head of your bed about 6 inches (15 cm).  Avoid bending over if this makes your symptoms worse.  Keep all follow-up visits as told by your health care provider. This is important. Contact a health care provider if:  You have: ? New symptoms. ? Unexplained weight loss. ? Difficulty swallowing or it hurts to swallow. ? Wheezing or a persistent cough. ? A hoarse voice.  Your symptoms do not improve with treatment. Get help right away if you:  Have pain in your arms, neck, jaw, teeth, or back.  Feel sweaty, dizzy, or light-headed.  Have chest pain or  shortness of breath.  Vomit and your vomit looks like blood or coffee grounds.  Faint.  Have stool that is bloody or black.  Cannot swallow, drink, or eat. Summary  Gastroesophageal reflux happens when acid from the stomach flows up into the esophagus. GERD is a disease in which the reflux happens often, causes frequent or severe symptoms, or causes problems such as damage to the esophagus.  Treatment for this condition may vary depending on how severe your symptoms are. Your health care provider may recommend diet and lifestyle changes, medicine, or surgery.  Contact a health care provider if you have new or worsening symptoms.  Take over-the-counter and prescription medicines only as told by your health care provider. Do not take aspirin, ibuprofen, or other NSAIDs unless your health care provider told you to do so.  Keep all follow-up visits as told by your health care provider. This is important. This information is not intended to replace advice given to you by your health care provider. Make sure you discuss any questions you have with your health care provider. Document Released: 10/28/2004 Document Revised: 07/27/2017 Document Reviewed: 07/27/2017 Elsevier Patient Education  [employer].   Constipation, Adult Constipation is when a person:  Poops (has a bowel movement) fewer times in a week than normal.  Has a hard time pooping.  Has poop that is dry, hard, or bigger than normal. Follow these instructions at home: Eating and drinking   Eat foods that have a lot of fiber, such as: ? Fresh fruits and vegetables. ? Whole grains. ? Beans.  Eat less of foods that are high in fat, low in fiber, or overly processed, such as: ? [place] fries. ? Hamburgers. ? Cookies. ? Candy. ? Soda.  Drink enough fluid to keep your pee (urine) clear or pale yellow. General instructions  Exercise regularly or as told by your doctor.  Go to the restroom when you feel like  you need to poop. Do not hold it in.  Take over-the-counter and prescription medicines only as told by your doctor. These include any fiber supplements.  Do pelvic floor retraining exercises, such as: ? Doing deep breathing while relaxing your lower belly (abdomen). ? Relaxing your pelvic floor while pooping.  Watch your condition for any changes.  Keep all follow-up visits as told by your doctor. This is important. Contact a doctor if:  You have pain that gets worse.  You have a fever.  You have not pooped for 4 days.  You throw up (vomit).  You are not hungry.  You lose weight.  You are bleeding from the anus.  You have thin, pencil-like poop (stool). Get help right away if:  You have a fever, and your symptoms suddenly get worse.  You leak poop or have blood in your poop.  Your belly feels hard or bigger than normal (is bloated).  You have very bad belly pain.  You feel dizzy or you faint. This information is not intended to replace advice given to you by your health care provider. Make sure you discuss any questions you have with your health care provider. Document Released: 07/07/2007 Document Revised: 12/31/2016 Document Reviewed: 07/09/2015 Elsevier Patient Education  2020 [employer].

## 2018-08-08 ENCOUNTER — Encounter: Payer: Self-pay | Admitting: Gastroenterology

## 2018-08-09 ENCOUNTER — Telehealth: Payer: Self-pay | Admitting: *Deleted

## 2018-08-09 NOTE — Telephone Encounter (Signed)
Patient left voice mail about lab work results. Stated Dr. Irene Limbo had given her Hgb in appt, she wanted to know her ferritin level. She stated she works and cannot always answer the phone, so ok to leave on voice mail.  Attempted to contact patient. LVM on named VM with Ferritin level and encouraged to call office for further questions

## 2018-10-27 ENCOUNTER — Other Ambulatory Visit: Payer: Self-pay

## 2018-10-27 ENCOUNTER — Emergency Department (HOSPITAL_COMMUNITY)
Admission: EM | Admit: 2018-10-27 | Discharge: 2018-10-27 | Disposition: A | Discharge status: Home / Self Care | Payer: Managed Care, Other (non HMO) | Source: Home / Self Care | Attending: Emergency Medicine | Admitting: Emergency Medicine

## 2018-10-27 ENCOUNTER — Encounter (HOSPITAL_COMMUNITY): Payer: Self-pay | Admitting: Emergency Medicine

## 2018-10-27 ENCOUNTER — Emergency Department (HOSPITAL_COMMUNITY): Payer: Managed Care, Other (non HMO)

## 2018-10-27 DIAGNOSIS — Z79899 Other long term (current) drug therapy: Secondary | ICD-10-CM | POA: Insufficient documentation

## 2018-10-27 DIAGNOSIS — N200 Calculus of kidney: Secondary | ICD-10-CM | POA: Insufficient documentation

## 2018-10-27 DIAGNOSIS — A419 Sepsis, unspecified organism: Secondary | ICD-10-CM | POA: Diagnosis not present

## 2018-10-27 DIAGNOSIS — A4159 Other Gram-negative sepsis: Secondary | ICD-10-CM | POA: Diagnosis not present

## 2018-10-27 LAB — COMPREHENSIVE METABOLIC PANEL
ALT: 15 U/L (ref 0–44)
AST: 18 U/L (ref 15–41)
Albumin: 3.8 g/dL (ref 3.5–5.0)
Alkaline Phosphatase: 49 U/L (ref 38–126)
Anion gap: 9 (ref 5–15)
BUN: 12 mg/dL (ref 6–20)
CO2: 25 mmol/L (ref 22–32)
Calcium: 8.8 mg/dL — ABNORMAL LOW (ref 8.9–10.3)
Chloride: 105 mmol/L (ref 98–111)
Creatinine, Ser: 0.9 mg/dL (ref 0.44–1.00)
GFR calc Af Amer: >60 mL/min (ref >60)
GFR calc non Af Amer: >60 mL/min (ref >60)
Glucose, Bld: 135 mg/dL — ABNORMAL HIGH (ref 70–99)
Potassium: 3.6 mmol/L (ref 3.5–5.1)
Sodium: 139 mmol/L (ref 135–145)
Total Bilirubin: 0.8 mg/dL (ref 0.3–1.2)
Total Protein: 7.2 g/dL (ref 6.5–8.1)

## 2018-10-27 LAB — CBC WITH DIFFERENTIAL/PLATELET
Abs Immature Granulocytes: 0.01 10*3/uL (ref 0.00–0.07)
Basophils Absolute: 0 10*3/uL (ref 0.0–0.1)
Basophils Relative: 1 %
Eosinophils Absolute: 0 10*3/uL (ref 0.0–0.5)
Eosinophils Relative: 1 %
HCT: 44.2 % (ref 36.0–46.0)
Hemoglobin: 14.3 g/dL (ref 12.0–15.0)
Immature Granulocytes: 0 %
Lymphocytes Relative: 53 %
Lymphs Abs: 2 10*3/uL (ref 0.7–4.0)
MCH: 29.4 pg (ref 26.0–34.0)
MCHC: 32.4 g/dL (ref 30.0–36.0)
MCV: 90.8 fL (ref 80.0–100.0)
Monocytes Absolute: 0.3 10*3/uL (ref 0.1–1.0)
Monocytes Relative: 9 %
Neutro Abs: 1.3 10*3/uL — ABNORMAL LOW (ref 1.7–7.7)
Neutrophils Relative %: 36 %
Platelets: 221 10*3/uL (ref 150–400)
RBC: 4.87 MIL/uL (ref 3.87–5.11)
RDW: 13.2 % (ref 11.5–15.5)
WBC: 3.7 10*3/uL — ABNORMAL LOW (ref 4.0–10.5)
nRBC: 0 % (ref 0.0–0.2)

## 2018-10-27 LAB — URINALYSIS, ROUTINE W REFLEX MICROSCOPIC
Bilirubin Urine: NEGATIVE
Glucose, UA: NEGATIVE mg/dL
Hgb urine dipstick: NEGATIVE
Ketones, ur: NEGATIVE mg/dL
Leukocytes,Ua: NEGATIVE
Nitrite: NEGATIVE
Protein, ur: NEGATIVE mg/dL
Specific Gravity, Urine: 1.018 (ref 1.005–1.030)
pH: 6 (ref 5.0–8.0)

## 2018-10-27 LAB — LIPASE, BLOOD: Lipase: 29 U/L (ref 11–51)

## 2018-10-27 MED ORDER — HYDROMORPHONE HCL 1 MG/ML IJ SOLN
1.0000 mg | Freq: Once | INTRAMUSCULAR | Status: AC
  Administered 2018-10-27: 1 mg via INTRAVENOUS
  Filled 2018-10-27: qty 1

## 2018-10-27 MED ORDER — KETOROLAC TROMETHAMINE 15 MG/ML IJ SOLN
15.0000 mg | Freq: Once | INTRAMUSCULAR | Status: AC
  Administered 2018-10-27: 08:00:00 15 mg via INTRAVENOUS
  Filled 2018-10-27: qty 1

## 2018-10-27 MED ORDER — HYDROCODONE-ACETAMINOPHEN 5-325 MG PO TABS: 1.0000 | ORAL_TABLET | Freq: Four times a day (QID) | ORAL | 0 refills | Status: DC | PRN

## 2018-10-27 MED ORDER — MORPHINE SULFATE (PF) 4 MG/ML IV SOLN
4.0000 mg | Freq: Once | INTRAVENOUS | Status: AC
  Administered 2018-10-27: 4 mg via INTRAVENOUS
  Filled 2018-10-27: qty 1

## 2018-10-27 MED ORDER — ONDANSETRON HCL 4 MG PO TABS: 4.0000 mg | ORAL_TABLET | Freq: Four times a day (QID) | ORAL | 0 refills | Status: DC

## 2018-10-27 MED ORDER — SODIUM CHLORIDE 0.9 % IV BOLUS (SEPSIS)
1000.0000 mL | Freq: Once | INTRAVENOUS | Status: AC
  Administered 2018-10-27: 1000 mL via INTRAVENOUS

## 2018-10-27 MED ORDER — ONDANSETRON HCL 4 MG/2ML IJ SOLN
4.0000 mg | Freq: Once | INTRAMUSCULAR | Status: AC
  Administered 2018-10-27: 4 mg via INTRAVENOUS
  Filled 2018-10-27: qty 2

## 2018-10-27 MED ORDER — TAMSULOSIN HCL 0.4 MG PO CAPS: 0.4000 mg | ORAL_CAPSULE | Freq: Every day | ORAL | 0 refills | Status: AC

## 2018-10-27 NOTE — ED Notes (Signed)
Patient verbalizes understanding of discharge instructions. Opportunity for questioning and answers were provided. Armband removed by staff, pt discharged from ED.  

## 2018-10-27 NOTE — Discharge Instructions (Signed)
You have a kidney stone.  There is no infection in your urine. Please follow-up with urology in 48 hours if your stone has not passed.  Take medication as prescribed. Medication may make you drowsy. Thank you for allowing me to care for you today. Please return to the emergency department if you have new or worsening symptoms. Take your medications as instructed.

## 2018-10-27 NOTE — ED Triage Notes (Signed)
Patient is reporting abdominal pain and left flank pain. pain with palpation  Reports it is the worst pain she has ever felt.

## 2018-10-27 NOTE — ED Provider Notes (Signed)
[facility] EMERGENCY DEPARTMENT Provider Note   CSN: [id number] Arrival date & time: 10/27/18  [id number]     History   Chief Complaint Chief Complaint  Patient presents with   Abdominal Pain   Flank Pain    HPI Tamara Watson is a 48 y.o. female.     Patient is a 48 year old female with past medical history of anxiety, constipation, anemia, GERD who presents emergency department for acute onset left-sided flank pain radiating into the belly since early this morning.  Reports that it feels like a cramping pain.  No exacerbating or relieving factors.  Patient is writhing in pain on the stretcher and crying.  Denies any history of kidney stone.  Reports last bowel movement was yesterday and was normal.  She denies any vaginal bleeding, vaginal discharge, dysuria, hematuria.  Has not tried anything for relief.     Past Medical History:  Diagnosis Date   Allergy    Anemia    Anxiety    in school only    Constipation    sennocot and miralax    Dyspnea    when hemoglobin low   GERD (gastroesophageal reflux disease)    Headache    due to Iron deficency    Patient Active Problem List   Diagnosis Date Noted   Fibroid, uterine 06/14/2018   Menorrhagia 06/14/2018   Iron deficiency anemia due to chronic blood loss 05/24/2018   GERD (gastroesophageal reflux disease) 01/09/2018   Encounter for annual physical exam 01/09/2018   Dysphagia 11/10/2016   Abdominal pain, chronic, epigastric 03/03/2016   Allergic rhinitis 03/01/2014   Iron deficiency anemia 05/07/2011   PALPITATIONS 04/23/2009   NONSPEC REACT TUBERCULIN SKN TEST W/O ACTV TB 07/03/2008   MENORRHAGIA 06/16/2007   ALOPECIA 06/16/2007   OBESITY 06/08/2006   DYSMENORRHEA 06/08/2006   ANXIETY 03/31/2006    Past Surgical History:  Procedure Laterality Date   BREAST LUMPECTOMY Left    20 yrs ago- benign    CYSTOSCOPY  06/14/2018   Procedure: Cystoscopy;  Surgeon:  Waymon Amato, MD;  Location: [facility];  Service: Gynecology;;   DILATION AND CURETTAGE OF UTERUS     x2   HYSTERECTOMY ABDOMINAL WITH SALPINGECTOMY Bilateral 06/14/2018   Procedure: HYSTERECTOMY ABDOMINAL WITH  BILATERAL SALPINGECTOMY;  Surgeon: Waymon Amato, MD;  Location: [facility];  Service: Gynecology;  Laterality: Bilateral;   NSVD     x3   TUBAL LIGATION       OB History   No obstetric history on file.      Home Medications    Prior to Admission medications   Medication Sig Start Date End Date Taking? Authorizing Provider  acetaminophen (TYLENOL) 500 MG tablet Take 1,000 mg by mouth every 6 (six) hours as needed for headache.    [provider]  benzonatate (TESSALON) 100 MG capsule Take 1 capsule (100 mg total) by mouth 3 (three) times daily as needed for cough. 01/09/18   [place] Bing, DO  dexlansoprazole (DEXILANT) 60 MG capsule TAKE 1 CAPSULE BY MOUTH DAILY BEFORE BREAKFAST. 08/01/18   Mauri Pole, MD  docusate sodium (COLACE) 100 MG capsule Take 1 capsule (100 mg total) by mouth 2 (two) times daily. 06/16/18   Waymon Amato, MD  fluticasone (FLONASE) 50 MCG/ACT nasal spray Place 2 sprays into both nostrils daily. Patient taking differently: Place 2 sprays into both nostrils daily as needed for allergies.  11/08/16   [place] Bussing, MD  HYDROcodone-acetaminophen (NORCO/VICODIN) 5-325 MG  tablet Take 1 tablet by mouth every 6 (six) hours as needed for up to 3 days for severe pain. 10/27/18 10/30/18  Alveria Apley, PA-C  ibuprofen (ADVIL) 600 MG tablet Take 1 tablet (600 mg total) by mouth every 6 (six) hours. 06/19/18   Waymon Amato, MD  Iron-FA-B Cmp-C-Biot-Probiotic (FUSION PLUS) CAPS Take 1 capsule by mouth daily with lunch.    [provider]  lidocaine (LIDODERM) 5 % Place 1 patch onto the skin daily. Remove & Discard patch within 12 hours or as directed by MD 06/16/18   Waymon Amato, MD  LINZESS 72 MCG capsule TAKE 1 CAPSULE (72 MCG TOTAL) BY MOUTH  DAILY BEFORE BREAKFAST. Patient taking differently: Take 72 mcg by mouth daily before breakfast.  11/03/17   Nandigam, Venia Minks, MD  ondansetron (ZOFRAN) 4 MG tablet Take 1 tablet (4 mg total) by mouth every 6 (six) hours. 10/27/18   Alveria Apley, PA-C  oxyCODONE-acetaminophen (PERCOCET/ROXICET) 5-325 MG tablet Take 1-2 tablets by mouth every 6 (six) hours as needed for moderate pain (once the patient tolerates po and PCA has been discontinued). Patient not taking: Reported on 08/01/2018 06/16/18   Waymon Amato, MD  pantoprazole (PROTONIX) 40 MG tablet Take 1 tablet (40 mg total) by mouth daily. 06/17/18   Waymon Amato, MD  senna-docusate (SENOKOT-S) 8.6-50 MG tablet Take 2 tablets by mouth at bedtime as needed for mild constipation or moderate constipation. 06/16/18   Waymon Amato, MD  tamsulosin (FLOMAX) 0.4 MG CAPS capsule Take 1 capsule (0.4 mg total) by mouth daily. 10/27/18 11/26/18  Madilyn Hook A, PA-C  vitamin C (ASCORBIC ACID) 500 MG tablet Take 500 mg by mouth daily.    [provider]    Family History Family History  Problem Relation Age of Onset   Cancer Maternal Aunt    Ovarian cancer Maternal Aunt    Cancer Paternal Grandmother    Pancreatic cancer Paternal Grandmother    Hyperlipidemia Mother    Diabetes Mother    Hyperlipidemia Father    Breast cancer Maternal Aunt    Colon cancer Neg Hx    Esophageal cancer Neg Hx    Stomach cancer Neg Hx    Rectal cancer Neg Hx    Colon polyps Neg Hx     Social History Social History   Tobacco Use   Smoking status: Never Smoker   Smokeless tobacco: Never Used  Substance Use Topics   Alcohol use: Yes    Alcohol/week: 1.0 standard drinks    Types: 1 Glasses of wine per week    Frequency: Never    Comment: ocassional   Drug use: Never     Allergies   Patient has no known allergies.   Review of Systems Review of Systems  Constitutional: Negative for chills and fever.  HENT: Negative for congestion  and sore throat.   Respiratory: Negative for cough and shortness of breath.   Cardiovascular: Negative for chest pain.  Gastrointestinal: Positive for abdominal pain and nausea. Negative for constipation, diarrhea and vomiting.  Genitourinary: Positive for flank pain. Negative for dysuria, hematuria and pelvic pain.  Musculoskeletal: Negative for back pain.  Skin: Negative for rash.  Neurological: Negative for light-headedness.  Hematological: Does not bruise/bleed easily.     Physical Exam Updated Vital Signs BP 115/78    Pulse 88    Temp 97.9 F (36.6 C) (Oral)    Resp 15    LMP 06/05/2018    SpO2 100%   Physical  Exam Vitals signs and nursing note reviewed.  Constitutional:      Appearance: She is well-developed. She is not toxic-appearing or diaphoretic.     Comments: Appears uncomfortable, writhing in pain  HENT:     Head: Normocephalic and atraumatic.     Mouth/Throat:     Mouth: Mucous membranes are moist.  Cardiovascular:     Rate and Rhythm: Regular rhythm. Tachycardia present.  Pulmonary:     Effort: Pulmonary effort is normal.     Breath sounds: Normal breath sounds.  Abdominal:     General: Abdomen is flat. Bowel sounds are normal.     Palpations: Abdomen is soft.     Tenderness: There is abdominal tenderness in the right lower quadrant. There is left CVA tenderness. There is no right CVA tenderness, guarding or rebound.  Skin:    General: Skin is warm.  Neurological:     Mental Status: She is alert.  Psychiatric:        Mood and Affect: Mood is not anxious.      ED Treatments / Results  Labs (all labs ordered are listed, but only abnormal results are displayed) Labs Reviewed  CBC WITH DIFFERENTIAL/PLATELET - Abnormal; Notable for the following components:      Result Value   WBC 3.7 (*)    Neutro Abs 1.3 (*)    All other components within normal limits  COMPREHENSIVE METABOLIC PANEL - Abnormal; Notable for the following components:   Glucose, Bld 135  (*)    Calcium 8.8 (*)    All other components within normal limits  LIPASE, BLOOD  URINALYSIS, ROUTINE W REFLEX MICROSCOPIC    EKG None  Radiology Ct Renal Stone Study  Result Date: 10/27/2018 CLINICAL DATA:  Left flank and lower abdominal pain with nausea and vomiting. EXAM: CT ABDOMEN AND PELVIS WITHOUT CONTRAST TECHNIQUE: Multidetector CT imaging of the abdomen and pelvis was performed following the standard protocol without IV contrast. COMPARISON:  None. FINDINGS: Lower chest: Mild dependent bibasilar atelectasis. No pleural or pericardial effusion. Hepatobiliary: No focal liver abnormality is seen. No gallstones, gallbladder wall thickening, or biliary dilatation. Pancreas: Unremarkable. No pancreatic ductal dilatation or surrounding inflammatory changes. Spleen: Normal in size without focal abnormality. Adrenals/Urinary Tract: The adrenal glands appear normal. There is mild to moderate left hydronephrosis with stranding about the left kidney and ureter due to a 0.5 cm distal left ureteral stone. No other urinary tract stones are identified. The kidneys otherwise appear normal. Urinary bladder is decompressed but otherwise unremarkable. Stomach/Bowel: Stomach is within normal limits. Appendix appears normal. No evidence of bowel wall thickening, distention, or inflammatory changes. Vascular/Lymphatic: No significant vascular findings are present. No enlarged abdominal or pelvic lymph nodes. Reproductive: Status post hysterectomy. No adnexal masses. Other: Small fat containing umbilical hernia noted. Stranding in subcutaneous fat anterior low pelvis is likely postoperative. Musculoskeletal: No acute or focal abnormality. IMPRESSION: Mild-to-moderate left hydronephrosis due to a 0.5 cm distal left ureteral stone. No other urinary tract stones are seen. Small fat containing umbilical hernia. Electronically Signed   By: Inge Rise M.D.   On: 10/27/2018 09:24    Procedures Procedures  (including critical care time)  Medications Ordered in ED Medications  HYDROmorphone (DILAUDID) injection 1 mg (has no administration in time range)  sodium chloride 0.9 % bolus 1,000 mL (0 mLs Intravenous Stopped 10/27/18 1011)  ketorolac (TORADOL) 15 MG/ML injection 15 mg (15 mg Intravenous Given 10/27/18 0748)  ondansetron (ZOFRAN) injection 4 mg (4 mg Intravenous Given  10/27/18 0748)  morphine 4 MG/ML injection 4 mg (4 mg Intravenous Given 10/27/18 0747)  HYDROmorphone (DILAUDID) injection 1 mg (1 mg Intravenous Given 10/27/18 [id number])     Initial Impression / Assessment and Plan / ED Course  I have reviewed the triage vital signs and the nursing notes.  Pertinent labs & imaging results that were available during my care of the patient were reviewed by me and considered in my medical decision making (see chart for details).  Clinical Course as of Oct 27 1043  Fri Oct 27, 2018  0925 Patient presenting with acute onset of L flank pain radiating to the abdomen. Labs unremarkable. Patient has not urinated yet. Pain controlled after morphine, toradol and hydromorphone.  CT scan showing a 5 mm stone in the left distal ureter which is consistent with patient's pain. Case discussed with Dr. Ronnald Nian and plan agreed upon   [KM]    Clinical Course User Index [KM] Alveria Apley, PA-C       Based on review of vitals, medical screening exam, lab work and/or imaging, there does not appear to be an acute, emergent etiology for the patient's symptoms. Counseled pt on good return precautions and encouraged both PCP and ED follow-up as needed.  Prior to discharge, I also discussed incidental imaging findings with patient in detail and advised appropriate, recommended follow-up in detail.  Clinical Impression: 1. Kidney stone     Disposition: Discharge  Prior to providing a prescription for a controlled substance, I independently reviewed the patient's recent prescription history on the [facility]. The patient had no recent or regular prescriptions and was deemed appropriate for a brief, less than 3 day prescription of narcotic for acute analgesia.  This note was prepared with assistance of Systems analyst. Occasional wrong-word or sound-a-like substitutions may have occurred due to the inherent limitations of voice recognition software.   Final Clinical Impressions(s) / ED Diagnoses   Final diagnoses:  Kidney stone    ED Discharge Orders         Ordered    tamsulosin (FLOMAX) 0.4 MG CAPS capsule  Daily     10/27/18 1013    HYDROcodone-acetaminophen (NORCO/VICODIN) 5-325 MG tablet  Every 6 hours PRN     10/27/18 1013    ondansetron (ZOFRAN) 4 MG tablet  Every 6 hours     10/27/18 1013           Kristine Royal 10/27/18 1045    [place], Quita Skye, DO 10/27/18 1110

## 2018-10-29 ENCOUNTER — Other Ambulatory Visit: Payer: Self-pay

## 2018-10-29 ENCOUNTER — Inpatient Hospital Stay (HOSPITAL_COMMUNITY): Payer: Managed Care, Other (non HMO)

## 2018-10-29 ENCOUNTER — Encounter (HOSPITAL_COMMUNITY): Payer: Self-pay | Admitting: Emergency Medicine

## 2018-10-29 ENCOUNTER — Emergency Department (HOSPITAL_COMMUNITY): Payer: Managed Care, Other (non HMO)

## 2018-10-29 ENCOUNTER — Inpatient Hospital Stay (HOSPITAL_COMMUNITY)
Admission: EM | Admit: 2018-10-29 | Discharge: 2018-11-09 | DRG: 871 | Disposition: A | Discharge status: Home / Self Care | Payer: Managed Care, Other (non HMO) | Attending: Internal Medicine | Admitting: Internal Medicine

## 2018-10-29 DIAGNOSIS — D62 Acute posthemorrhagic anemia: Secondary | ICD-10-CM | POA: Diagnosis not present

## 2018-10-29 DIAGNOSIS — N200 Calculus of kidney: Secondary | ICD-10-CM | POA: Diagnosis not present

## 2018-10-29 DIAGNOSIS — R509 Fever, unspecified: Secondary | ICD-10-CM

## 2018-10-29 DIAGNOSIS — E861 Hypovolemia: Secondary | ICD-10-CM | POA: Diagnosis present

## 2018-10-29 DIAGNOSIS — Q638 Other specified congenital malformations of kidney: Secondary | ICD-10-CM | POA: Diagnosis not present

## 2018-10-29 DIAGNOSIS — K5909 Other constipation: Secondary | ICD-10-CM | POA: Diagnosis present

## 2018-10-29 DIAGNOSIS — N179 Acute kidney failure, unspecified: Secondary | ICD-10-CM | POA: Diagnosis present

## 2018-10-29 DIAGNOSIS — N201 Calculus of ureter: Secondary | ICD-10-CM

## 2018-10-29 DIAGNOSIS — R7881 Bacteremia: Secondary | ICD-10-CM | POA: Diagnosis not present

## 2018-10-29 DIAGNOSIS — I471 Supraventricular tachycardia: Secondary | ICD-10-CM | POA: Diagnosis present

## 2018-10-29 DIAGNOSIS — B370 Candidal stomatitis: Secondary | ICD-10-CM | POA: Diagnosis present

## 2018-10-29 DIAGNOSIS — J302 Other seasonal allergic rhinitis: Secondary | ICD-10-CM | POA: Diagnosis present

## 2018-10-29 DIAGNOSIS — Z79899 Other long term (current) drug therapy: Secondary | ICD-10-CM

## 2018-10-29 DIAGNOSIS — N136 Pyonephrosis: Secondary | ICD-10-CM | POA: Diagnosis present

## 2018-10-29 DIAGNOSIS — E876 Hypokalemia: Secondary | ICD-10-CM | POA: Diagnosis present

## 2018-10-29 DIAGNOSIS — R5081 Fever presenting with conditions classified elsewhere: Secondary | ICD-10-CM | POA: Diagnosis not present

## 2018-10-29 DIAGNOSIS — B961 Klebsiella pneumoniae [K. pneumoniae] as the cause of diseases classified elsewhere: Secondary | ICD-10-CM | POA: Diagnosis present

## 2018-10-29 DIAGNOSIS — A419 Sepsis, unspecified organism: Secondary | ICD-10-CM | POA: Diagnosis not present

## 2018-10-29 DIAGNOSIS — K219 Gastro-esophageal reflux disease without esophagitis: Secondary | ICD-10-CM | POA: Diagnosis present

## 2018-10-29 DIAGNOSIS — Z800 Family history of malignant neoplasm of digestive organs: Secondary | ICD-10-CM | POA: Diagnosis not present

## 2018-10-29 DIAGNOSIS — J309 Allergic rhinitis, unspecified: Secondary | ICD-10-CM | POA: Diagnosis present

## 2018-10-29 DIAGNOSIS — Z833 Family history of diabetes mellitus: Secondary | ICD-10-CM | POA: Diagnosis not present

## 2018-10-29 DIAGNOSIS — F419 Anxiety disorder, unspecified: Secondary | ICD-10-CM | POA: Diagnosis present

## 2018-10-29 DIAGNOSIS — Z90711 Acquired absence of uterus with remaining cervical stump: Secondary | ICD-10-CM

## 2018-10-29 DIAGNOSIS — D6959 Other secondary thrombocytopenia: Secondary | ICD-10-CM | POA: Diagnosis present

## 2018-10-29 DIAGNOSIS — K5900 Constipation, unspecified: Secondary | ICD-10-CM | POA: Diagnosis present

## 2018-10-29 DIAGNOSIS — R000 Tachycardia, unspecified: Secondary | ICD-10-CM

## 2018-10-29 DIAGNOSIS — R7989 Other specified abnormal findings of blood chemistry: Secondary | ICD-10-CM | POA: Diagnosis not present

## 2018-10-29 DIAGNOSIS — J9811 Atelectasis: Secondary | ICD-10-CM | POA: Diagnosis present

## 2018-10-29 DIAGNOSIS — N170 Acute kidney failure with tubular necrosis: Secondary | ICD-10-CM | POA: Diagnosis not present

## 2018-10-29 DIAGNOSIS — Z8041 Family history of malignant neoplasm of ovary: Secondary | ICD-10-CM | POA: Diagnosis not present

## 2018-10-29 DIAGNOSIS — Z791 Long term (current) use of non-steroidal anti-inflammatories (NSAID): Secondary | ICD-10-CM

## 2018-10-29 DIAGNOSIS — Z87442 Personal history of urinary calculi: Secondary | ICD-10-CM

## 2018-10-29 DIAGNOSIS — N171 Acute kidney failure with acute cortical necrosis: Secondary | ICD-10-CM

## 2018-10-29 DIAGNOSIS — R6521 Severe sepsis with septic shock: Secondary | ICD-10-CM | POA: Diagnosis not present

## 2018-10-29 DIAGNOSIS — R110 Nausea: Secondary | ICD-10-CM | POA: Diagnosis not present

## 2018-10-29 DIAGNOSIS — N151 Renal and perinephric abscess: Secondary | ICD-10-CM

## 2018-10-29 DIAGNOSIS — Z20828 Contact with and (suspected) exposure to other viral communicable diseases: Secondary | ICD-10-CM | POA: Diagnosis present

## 2018-10-29 DIAGNOSIS — A4159 Other Gram-negative sepsis: Principal | ICD-10-CM | POA: Diagnosis present

## 2018-10-29 DIAGNOSIS — M549 Dorsalgia, unspecified: Secondary | ICD-10-CM

## 2018-10-29 DIAGNOSIS — N12 Tubulo-interstitial nephritis, not specified as acute or chronic: Secondary | ICD-10-CM | POA: Diagnosis not present

## 2018-10-29 DIAGNOSIS — Z8349 Family history of other endocrine, nutritional and metabolic diseases: Secondary | ICD-10-CM

## 2018-10-29 HISTORY — PX: IR NEPHROSTOMY PLACEMENT LEFT: IMG6063

## 2018-10-29 LAB — BASIC METABOLIC PANEL
Anion gap: 14 (ref 5–15)
BUN: 16 mg/dL (ref 6–20)
CO2: 22 mmol/L (ref 22–32)
Calcium: 8.5 mg/dL — ABNORMAL LOW (ref 8.9–10.3)
Chloride: 98 mmol/L (ref 98–111)
Creatinine, Ser: 1.59 mg/dL — ABNORMAL HIGH (ref 0.44–1.00)
GFR calc Af Amer: 45 mL/min — ABNORMAL LOW (ref >60)
GFR calc non Af Amer: 39 mL/min — ABNORMAL LOW (ref >60)
Glucose, Bld: 112 mg/dL — ABNORMAL HIGH (ref 70–99)
Potassium: 3.5 mmol/L (ref 3.5–5.1)
Sodium: 134 mmol/L — ABNORMAL LOW (ref 135–145)

## 2018-10-29 LAB — URINALYSIS, ROUTINE W REFLEX MICROSCOPIC
Bilirubin Urine: NEGATIVE
Glucose, UA: NEGATIVE mg/dL
Ketones, ur: 5 mg/dL — AB
Leukocytes,Ua: NEGATIVE
Nitrite: NEGATIVE
Protein, ur: 100 mg/dL — AB
Specific Gravity, Urine: 1.023 (ref 1.005–1.030)
pH: 5 (ref 5.0–8.0)

## 2018-10-29 LAB — COMPREHENSIVE METABOLIC PANEL
ALT: 20 U/L (ref 0–44)
AST: 25 U/L (ref 15–41)
Albumin: 2.8 g/dL — ABNORMAL LOW (ref 3.5–5.0)
Alkaline Phosphatase: 71 U/L (ref 38–126)
Anion gap: 14 (ref 5–15)
BUN: 18 mg/dL (ref 6–20)
CO2: 18 mmol/L — ABNORMAL LOW (ref 22–32)
Calcium: 7.9 mg/dL — ABNORMAL LOW (ref 8.9–10.3)
Chloride: 100 mmol/L (ref 98–111)
Creatinine, Ser: 1.53 mg/dL — ABNORMAL HIGH (ref 0.44–1.00)
GFR calc Af Amer: 47 mL/min — ABNORMAL LOW (ref >60)
GFR calc non Af Amer: 41 mL/min — ABNORMAL LOW (ref >60)
Glucose, Bld: 117 mg/dL — ABNORMAL HIGH (ref 70–99)
Potassium: 3.1 mmol/L — ABNORMAL LOW (ref 3.5–5.1)
Sodium: 132 mmol/L — ABNORMAL LOW (ref 135–145)
Total Bilirubin: 1.2 mg/dL (ref 0.3–1.2)
Total Protein: 6.1 g/dL — ABNORMAL LOW (ref 6.5–8.1)

## 2018-10-29 LAB — CBC
HCT: 43.8 % (ref 36.0–46.0)
Hemoglobin: 15.1 g/dL — ABNORMAL HIGH (ref 12.0–15.0)
MCH: 30.4 pg (ref 26.0–34.0)
MCHC: 34.5 g/dL (ref 30.0–36.0)
MCV: 88.1 fL (ref 80.0–100.0)
Platelets: 70 10*3/uL — ABNORMAL LOW (ref 150–400)
RBC: 4.97 MIL/uL (ref 3.87–5.11)
RDW: 13.7 % (ref 11.5–15.5)
WBC: 7.9 10*3/uL (ref 4.0–10.5)
nRBC: 0 % (ref 0.0–0.2)

## 2018-10-29 LAB — FIBRINOGEN: Fibrinogen: >800 mg/dL — ABNORMAL HIGH (ref 210–475)

## 2018-10-29 LAB — LACTIC ACID, PLASMA
Lactic Acid, Venous: 3.2 mmol/L (ref 0.5–1.9)
Lactic Acid, Venous: 2.5 mmol/L (ref 0.5–1.9)
Lactic Acid, Venous: 4.4 mmol/L (ref 0.5–1.9)

## 2018-10-29 LAB — D-DIMER, QUANTITATIVE: D-Dimer, Quant: 11.87 ug/mL-FEU — ABNORMAL HIGH (ref 0.00–0.50)

## 2018-10-29 LAB — PROTIME-INR
INR: 1.4 — ABNORMAL HIGH (ref 0.8–1.2)
Prothrombin Time: 17 seconds — ABNORMAL HIGH (ref 11.4–15.2)

## 2018-10-29 LAB — I-STAT BETA HCG BLOOD, ED (MC, WL, AP ONLY): I-stat hCG, quantitative: <5 m[IU]/mL (ref <5)

## 2018-10-29 LAB — APTT: aPTT: 29 seconds (ref 24–36)

## 2018-10-29 LAB — TROPONIN I (HIGH SENSITIVITY): Troponin I (High Sensitivity): 39 ng/L — ABNORMAL HIGH (ref <18)

## 2018-10-29 LAB — SARS CORONAVIRUS 2 BY RT PCR (HOSPITAL ORDER, PERFORMED IN ~~LOC~~ HOSPITAL LAB): SARS Coronavirus 2: NEGATIVE

## 2018-10-29 MED ORDER — POTASSIUM CHLORIDE 10 MEQ/100ML IV SOLN: 10.0000 meq | INTRAVENOUS | Status: DC

## 2018-10-29 MED ORDER — SODIUM CHLORIDE 0.9 % IV BOLUS
1000.0000 mL | Freq: Once | INTRAVENOUS | Status: AC
  Administered 2018-10-29: 1000 mL via INTRAVENOUS

## 2018-10-29 MED ORDER — LIDOCAINE HCL (PF) 1 % IJ SOLN
INTRAMUSCULAR | Status: AC | PRN
  Administered 2018-10-29 (×2): 10 mL

## 2018-10-29 MED ORDER — ACETAMINOPHEN 325 MG PO TABS: 650.0000 mg | ORAL_TABLET | Freq: Four times a day (QID) | ORAL | Status: DC | PRN

## 2018-10-29 MED ORDER — SODIUM CHLORIDE 0.9 % IV SOLN
2.0000 g | Freq: Once | INTRAVENOUS | Status: AC
  Administered 2018-10-29: 15:00:00 2 g via INTRAVENOUS
  Filled 2018-10-29: qty 2

## 2018-10-29 MED ORDER — ACETAMINOPHEN 650 MG RE SUPP
650.0000 mg | Freq: Once | RECTAL | Status: AC
  Administered 2018-10-29: 650 mg via RECTAL
  Filled 2018-10-29: qty 1

## 2018-10-29 MED ORDER — SODIUM CHLORIDE 0.9 % IV SOLN: 2.0000 g | Freq: Two times a day (BID) | INTRAVENOUS | Status: DC

## 2018-10-29 MED ORDER — HYDROMORPHONE HCL 1 MG/ML IJ SOLN
1.0000 mg | Freq: Once | INTRAMUSCULAR | Status: AC
  Administered 2018-10-29: 1 mg via INTRAVENOUS
  Filled 2018-10-29: qty 1

## 2018-10-29 MED ORDER — SENNOSIDES-DOCUSATE SODIUM 8.6-50 MG PO TABS: 2.0000 | ORAL_TABLET | Freq: Every evening | ORAL | Status: DC | PRN

## 2018-10-29 MED ORDER — TAMSULOSIN HCL 0.4 MG PO CAPS
0.4000 mg | ORAL_CAPSULE | Freq: Every day | ORAL | Status: DC
  Administered 2018-10-30 – 2018-11-09 (×9): 0.4 mg via ORAL
  Filled 2018-10-29 (×11): qty 1

## 2018-10-29 MED ORDER — SODIUM CHLORIDE 0.9 % IV SOLN
INTRAVENOUS | Status: DC
  Administered 2018-10-29: via INTRAVENOUS

## 2018-10-29 MED ORDER — MIDAZOLAM HCL 2 MG/2ML IJ SOLN
INTRAMUSCULAR | Status: AC | PRN
  Administered 2018-10-29 (×2): 0.5 mg via INTRAVENOUS
  Administered 2018-10-29: 1 mg via INTRAVENOUS

## 2018-10-29 MED ORDER — SODIUM CHLORIDE 0.9 % IV SOLN
INTRAVENOUS | Status: AC | PRN
  Administered 2018-10-29: 10 mL/h via INTRAVENOUS

## 2018-10-29 MED ORDER — MORPHINE SULFATE (PF) 4 MG/ML IV SOLN: 4.0000 mg | Freq: Once | INTRAVENOUS | Status: DC

## 2018-10-29 MED ORDER — MIDAZOLAM HCL 2 MG/2ML IJ SOLN
INTRAMUSCULAR | Status: AC
  Filled 2018-10-29: qty 2

## 2018-10-29 MED ORDER — ONDANSETRON HCL 4 MG PO TABS
4.0000 mg | ORAL_TABLET | Freq: Four times a day (QID) | ORAL | Status: DC
  Administered 2018-10-30 – 2018-10-31 (×5): 4 mg via ORAL
  Filled 2018-10-29 (×5): qty 1

## 2018-10-29 MED ORDER — FENTANYL CITRATE (PF) 100 MCG/2ML IJ SOLN
INTRAMUSCULAR | Status: AC | PRN
  Administered 2018-10-29 (×4): 25 ug via INTRAVENOUS

## 2018-10-29 MED ORDER — HYDROMORPHONE HCL 1 MG/ML IJ SOLN
1.0000 mg | INTRAMUSCULAR | Status: DC | PRN
  Administered 2018-10-30 (×2): 1 mg via INTRAVENOUS
  Filled 2018-10-29 (×2): qty 1

## 2018-10-29 MED ORDER — FLUTICASONE PROPIONATE 50 MCG/ACT NA SUSP
2.0000 | Freq: Every day | NASAL | Status: DC | PRN
  Filled 2018-10-29: qty 16

## 2018-10-29 MED ORDER — VANCOMYCIN HCL 10 G IV SOLR
2000.0000 mg | Freq: Once | INTRAVENOUS | Status: AC
  Administered 2018-10-29: 2000 mg via INTRAVENOUS
  Filled 2018-10-29: qty 2000

## 2018-10-29 MED ORDER — SODIUM CHLORIDE 0.9 % IV BOLUS
1000.0000 mL | Freq: Once | INTRAVENOUS | Status: AC
  Administered 2018-10-29: 15:00:00 1000 mL via INTRAVENOUS

## 2018-10-29 MED ORDER — PANTOPRAZOLE SODIUM 40 MG PO TBEC
40.0000 mg | DELAYED_RELEASE_TABLET | Freq: Every day | ORAL | Status: DC
  Administered 2018-10-30 – 2018-11-09 (×9): 40 mg via ORAL
  Filled 2018-10-29 (×12): qty 1

## 2018-10-29 MED ORDER — HEPARIN SODIUM (PORCINE) 5000 UNIT/ML IJ SOLN
5000.0000 [IU] | Freq: Three times a day (TID) | INTRAMUSCULAR | Status: DC
  Administered 2018-10-30 (×2): 5000 [IU] via SUBCUTANEOUS
  Filled 2018-10-29 (×2): qty 1

## 2018-10-29 MED ORDER — ACETAMINOPHEN 650 MG RE SUPP: 650.0000 mg | Freq: Four times a day (QID) | RECTAL | Status: DC | PRN

## 2018-10-29 MED ORDER — VANCOMYCIN HCL 10 G IV SOLR: 1750.0000 mg | INTRAVENOUS | Status: DC

## 2018-10-29 MED ORDER — ACETAMINOPHEN 500 MG PO TABS: 1000.0000 mg | ORAL_TABLET | Freq: Once | ORAL | Status: DC

## 2018-10-29 MED ORDER — FENTANYL CITRATE (PF) 100 MCG/2ML IJ SOLN
INTRAMUSCULAR | Status: AC
  Filled 2018-10-29: qty 2

## 2018-10-29 MED ORDER — KETOROLAC TROMETHAMINE 30 MG/ML IJ SOLN
15.0000 mg | Freq: Once | INTRAMUSCULAR | Status: AC
  Administered 2018-10-29: 15 mg via INTRAVENOUS
  Filled 2018-10-29: qty 1

## 2018-10-29 MED ORDER — ACETAMINOPHEN 325 MG PO TABS
650.0000 mg | ORAL_TABLET | Freq: Four times a day (QID) | ORAL | Status: DC | PRN
  Administered 2018-10-30 – 2018-11-01 (×4): 650 mg via ORAL
  Filled 2018-10-29 (×4): qty 2

## 2018-10-29 MED ORDER — ONDANSETRON HCL 4 MG/2ML IJ SOLN: 4.0000 mg | Freq: Once | INTRAMUSCULAR | Status: DC

## 2018-10-29 MED ORDER — HYDROCODONE-ACETAMINOPHEN 5-325 MG PO TABS
1.0000 | ORAL_TABLET | Freq: Four times a day (QID) | ORAL | Status: DC | PRN
  Administered 2018-10-30 – 2018-10-31 (×4): 1 via ORAL
  Filled 2018-10-29 (×4): qty 1

## 2018-10-29 MED ORDER — NOREPINEPHRINE 4 MG/250ML-% IV SOLN
0.0000 ug/min | INTRAVENOUS | Status: DC
  Administered 2018-10-29: 2 ug/min via INTRAVENOUS
  Filled 2018-10-29: qty 250

## 2018-10-29 MED ORDER — POTASSIUM CHLORIDE CRYS ER 20 MEQ PO TBCR
40.0000 meq | EXTENDED_RELEASE_TABLET | ORAL | Status: AC
  Administered 2018-10-29 – 2018-10-30 (×3): 40 meq via ORAL
  Filled 2018-10-29 (×3): qty 2

## 2018-10-29 MED ORDER — LIDOCAINE HCL 1 % IJ SOLN
INTRAMUSCULAR | Status: AC
  Filled 2018-10-29: qty 20

## 2018-10-29 MED ORDER — FENTANYL CITRATE (PF) 100 MCG/2ML IJ SOLN
100.0000 ug | Freq: Once | INTRAMUSCULAR | Status: AC
  Administered 2018-10-29: 100 ug via INTRAVENOUS
  Filled 2018-10-29: qty 2

## 2018-10-29 MED ORDER — BENZONATATE 100 MG PO CAPS: 100.0000 mg | ORAL_CAPSULE | Freq: Three times a day (TID) | ORAL | Status: DC | PRN

## 2018-10-29 MED ORDER — IOHEXOL 300 MG/ML  SOLN
50.0000 mL | Freq: Once | INTRAMUSCULAR | Status: AC | PRN
  Administered 2018-10-29: 19:00:00 20 mL

## 2018-10-29 MED ORDER — ACETAMINOPHEN 325 MG PO TABS
650.0000 mg | ORAL_TABLET | Freq: Once | ORAL | Status: DC
  Filled 2018-10-29: qty 2

## 2018-10-29 MED ORDER — ACETAMINOPHEN 325 MG PO TABS
650.0000 mg | ORAL_TABLET | Freq: Once | ORAL | Status: AC | PRN
  Administered 2018-10-29: 650 mg via ORAL
  Filled 2018-10-29: qty 2

## 2018-10-29 NOTE — ED Notes (Signed)
Pt went to IR.

## 2018-10-29 NOTE — ED Provider Notes (Signed)
[facility] EMERGENCY DEPARTMENT Provider Note   CSN: [id number] Arrival date & time: 10/29/18  1320     History   Chief Complaint Chief Complaint  Patient presents with  . Fever  . Nephrolithiasis    HPI Tamara Watson is a 48 y.o. female who presents for evaluation of fever, generalized weakness, worsening left sided abdominal pain.  She was seen in the ED on 10/27/18 for evaluation of flank pain.  At that time, she was noted to have a 5 mm left kidney stone.  She was discharged home with pain medications.  She reports that that night, she started having chills.  The next day, she started having some hot flashes.  She never actually measured a temperature but just said she felt subjective fever and chills.  She also reports that she has not been able to eat or drink much since onset of symptoms.  She states that she has had a vomiting but has had extreme nausea.  She states that her abdominal pain has gotten worse.  It starts in the back and radiates to the front.  She states her urine has been very dark but has not noted any pain with urination.  Patient states that she has not had any chest pain or cough.  She states that she has had some trouble breathing when she gets the extreme pain but otherwise denies any trouble breathing.  No recent travel, known COVID-19 exposure.     The history is provided by the patient.    Past Medical History:  Diagnosis Date  . Allergy   . Anemia   . Anxiety    in school only   . Constipation    sennocot and miralax   . Dyspnea    when hemoglobin low  . GERD (gastroesophageal reflux disease)   . Headache    due to Iron deficency    Patient Active Problem List   Diagnosis Date Noted  . Fibroid, uterine 06/14/2018  . Menorrhagia 06/14/2018  . Iron deficiency anemia due to chronic blood loss 05/24/2018  . GERD (gastroesophageal reflux disease) 01/09/2018  . Encounter for annual physical exam 01/09/2018  .  Dysphagia 11/10/2016  . Abdominal pain, chronic, epigastric 03/03/2016  . Allergic rhinitis 03/01/2014  . Iron deficiency anemia 05/07/2011  . PALPITATIONS 04/23/2009  . NONSPEC REACT TUBERCULIN SKN TEST W/O ACTV TB 07/03/2008  . MENORRHAGIA 06/16/2007  . ALOPECIA 06/16/2007  . OBESITY 06/08/2006  . DYSMENORRHEA 06/08/2006  . ANXIETY 03/31/2006    Past Surgical History:  Procedure Laterality Date  . BREAST LUMPECTOMY Left    20 yrs ago- benign   . CYSTOSCOPY  06/14/2018   Procedure: Cystoscopy;  Surgeon: Waymon Amato, MD;  Location: [facility];  Service: Gynecology;;  . DILATION AND CURETTAGE OF UTERUS     x2  . HYSTERECTOMY ABDOMINAL WITH SALPINGECTOMY Bilateral 06/14/2018   Procedure: HYSTERECTOMY ABDOMINAL WITH  BILATERAL SALPINGECTOMY;  Surgeon: Waymon Amato, MD;  Location: [facility];  Service: Gynecology;  Laterality: Bilateral;  . NSVD     x3  . TUBAL LIGATION       OB History   No obstetric history on file.      Home Medications    Prior to Admission medications   Medication Sig Start Date End Date Taking? Authorizing Provider  acetaminophen (TYLENOL) 500 MG tablet Take 1,000 mg by mouth every 6 (six) hours as needed for headache.    [provider]  benzonatate (TESSALON) 100 MG  capsule Take 1 capsule (100 mg total) by mouth 3 (three) times daily as needed for cough. 01/09/18   [place] Bing, DO  dexlansoprazole (DEXILANT) 60 MG capsule TAKE 1 CAPSULE BY MOUTH DAILY BEFORE BREAKFAST. 08/01/18   Mauri Pole, MD  docusate sodium (COLACE) 100 MG capsule Take 1 capsule (100 mg total) by mouth 2 (two) times daily. 06/16/18   Waymon Amato, MD  fluticasone (FLONASE) 50 MCG/ACT nasal spray Place 2 sprays into both nostrils daily. Patient taking differently: Place 2 sprays into both nostrils daily as needed for allergies.  11/08/16   Rogue Bussing, MD  HYDROcodone-acetaminophen (NORCO/VICODIN) 5-325 MG tablet Take 1 tablet by mouth every 6 (six) hours as needed  for up to 3 days for severe pain. 10/27/18 10/30/18  Alveria Apley, PA-C  ibuprofen (ADVIL) 600 MG tablet Take 1 tablet (600 mg total) by mouth every 6 (six) hours. 06/19/18   Waymon Amato, MD  Iron-FA-B Cmp-C-Biot-Probiotic (FUSION PLUS) CAPS Take 1 capsule by mouth daily with lunch.    [provider]  lidocaine (LIDODERM) 5 % Place 1 patch onto the skin daily. Remove & Discard patch within 12 hours or as directed by MD 06/16/18   Waymon Amato, MD  LINZESS 72 MCG capsule TAKE 1 CAPSULE (72 MCG TOTAL) BY MOUTH DAILY BEFORE BREAKFAST. Patient taking differently: Take 72 mcg by mouth daily before breakfast.  11/03/17   Nandigam, Venia Minks, MD  ondansetron (ZOFRAN) 4 MG tablet Take 1 tablet (4 mg total) by mouth every 6 (six) hours. 10/27/18   Alveria Apley, PA-C  oxyCODONE-acetaminophen (PERCOCET/ROXICET) 5-325 MG tablet Take 1-2 tablets by mouth every 6 (six) hours as needed for moderate pain (once the patient tolerates po and PCA has been discontinued). Patient not taking: Reported on 08/01/2018 06/16/18   Waymon Amato, MD  pantoprazole (PROTONIX) 40 MG tablet Take 1 tablet (40 mg total) by mouth daily. 06/17/18   Waymon Amato, MD  senna-docusate (SENOKOT-S) 8.6-50 MG tablet Take 2 tablets by mouth at bedtime as needed for mild constipation or moderate constipation. 06/16/18   Waymon Amato, MD  tamsulosin (FLOMAX) 0.4 MG CAPS capsule Take 1 capsule (0.4 mg total) by mouth daily. 10/27/18 11/26/18  Madilyn Hook A, PA-C  vitamin C (ASCORBIC ACID) 500 MG tablet Take 500 mg by mouth daily.    [provider]    Family History Family History  Problem Relation Age of Onset  . Cancer Maternal Aunt   . Ovarian cancer Maternal Aunt   . Cancer Paternal Grandmother   . Pancreatic cancer Paternal Grandmother   . Hyperlipidemia Mother   . Diabetes Mother   . Hyperlipidemia Father   . Breast cancer Maternal Aunt   . Colon cancer Neg Hx   . Esophageal cancer Neg Hx   . Stomach cancer Neg Hx   .  Rectal cancer Neg Hx   . Colon polyps Neg Hx     Social History Social History   Tobacco Use  . Smoking status: Never Smoker  . Smokeless tobacco: Never Used  Substance Use Topics  . Alcohol use: Yes    Alcohol/week: 1.0 standard drinks    Types: 1 Glasses of wine per week    Frequency: Never    Comment: ocassional  . Drug use: Never     Allergies   Patient has no known allergies.   Review of Systems Review of Systems  Constitutional: Positive for appetite change, chills, fatigue and fever.  Respiratory: Negative  for cough and shortness of breath.   Cardiovascular: Negative for chest pain.  Gastrointestinal: Positive for abdominal pain and nausea. Negative for vomiting.  Genitourinary: Positive for flank pain and hematuria. Negative for dysuria.  Neurological: Negative for headaches.  All other systems reviewed and are negative.    Physical Exam Updated Vital Signs BP 90/77   Pulse (!) 128   Temp (!) 101.4 F (38.6 C) (Oral)   Resp (!) 24   Ht 5' 5.5" (1.664 m)   Wt 92.1 kg   LMP 06/05/2018   SpO2 100%   BMI 33.27 kg/m   Physical Exam Vitals signs and nursing note reviewed.  Constitutional:      Appearance: Normal appearance. She is well-developed.     Comments: Appears uncomfortable.  HENT:     Head: Normocephalic and atraumatic.  Eyes:     General: Lids are normal.     Conjunctiva/sclera: Conjunctivae normal.     Pupils: Pupils are equal, round, and reactive to light.  Neck:     Musculoskeletal: Full passive range of motion without pain.  Cardiovascular:     Rate and Rhythm: Regular rhythm. Tachycardia present.     Pulses: Normal pulses.     Heart sounds: Normal heart sounds. No murmur. No friction rub. No gallop.   Pulmonary:     Effort: Pulmonary effort is normal.     Breath sounds: Normal breath sounds.     Comments: Lungs clear to auscultation bilaterally.  Symmetric chest rise.  No wheezing, rales, rhonchi. Abdominal:     Palpations:  Abdomen is soft. Abdomen is not rigid.     Tenderness: There is abdominal tenderness in the left lower quadrant. There is left CVA tenderness. There is no guarding.     Comments: Left-sided CVA tenderness noted.  Tenderness palpation in left abdomen.  No rigidity, guarding.  Musculoskeletal: Normal range of motion.  Skin:    General: Skin is warm and dry.     Capillary Refill: Capillary refill takes less than 2 seconds.  Neurological:     Mental Status: She is alert and oriented to person, place, and time.  Psychiatric:        Speech: Speech normal.      ED Treatments / Results  Labs (all labs ordered are listed, but only abnormal results are displayed) Labs Reviewed  URINALYSIS, ROUTINE W REFLEX MICROSCOPIC - Abnormal; Notable for the following components:      Result Value   APPearance HAZY (*)    Hgb urine dipstick SMALL (*)    Ketones, ur 5 (*)    Protein, ur 100 (*)    Bacteria, UA RARE (*)    All other components within normal limits  CBC - Abnormal; Notable for the following components:   Hemoglobin 15.1 (*)    Platelets 70 (*)    All other components within normal limits  BASIC METABOLIC PANEL - Abnormal; Notable for the following components:   Sodium 134 (*)    Glucose, Bld 112 (*)    Creatinine, Ser 1.59 (*)    Calcium 8.5 (*)    GFR calc non Af Amer 39 (*)    GFR calc Af Amer 45 (*)    All other components within normal limits  LACTIC ACID, PLASMA - Abnormal; Notable for the following components:   Lactic Acid, Venous 3.2 (*)    All other components within normal limits  COMPREHENSIVE METABOLIC PANEL - Abnormal; Notable for the following components:   Sodium 132 (*)  Potassium 3.1 (*)    CO2 18 (*)    Glucose, Bld 117 (*)    Creatinine, Ser 1.53 (*)    Calcium 7.9 (*)    Total Protein 6.1 (*)    Albumin 2.8 (*)    GFR calc non Af Amer 41 (*)    GFR calc Af Amer 47 (*)    All other components within normal limits  PROTIME-INR - Abnormal; Notable for  the following components:   Prothrombin Time 17.0 (*)    INR 1.4 (*)    All other components within normal limits  SARS CORONAVIRUS 2 (HOSPITAL ORDER, [facility])  CULTURE, BLOOD (ROUTINE X 2)  CULTURE, BLOOD (ROUTINE X 2)  URINE CULTURE  APTT  LACTIC ACID, PLASMA  I-STAT BETA HCG BLOOD, ED ([facility], [facility], [facility] ONLY)    EKG EKG Interpretation  Date/Time:  Sunday October 29 2018 13:36:05 EDT Ventricular Rate:  156 PR Interval:    QRS Duration: 78 QT Interval:  312 QTC Calculation: 502 R Axis:   40 Text Interpretation:  Sinus tachycardia with Premature ventricular complexes or Fusion complexes Nonspecific T wave abnormality Abnormal ECG similat to previous except tachycardia Confirmed by Charlesetta Shanks [phone]) on 10/29/2018 2:45:30 PM   Radiology Dg Chest Port 1 View  Result Date: 10/29/2018 CLINICAL DATA:  Pt states diagnosed with renal stones Friday, states pain has gotten worse and she has been febrile at home, unsure how high. C/o L flank and abdominal pain. Pt denies vomiting but endorses nausea. Denies blood in urine. Last pai.*comment was truncated*Sepsis EXAM: PORTABLE CHEST 1 VIEW COMPARISON:  None. FINDINGS: Normal cardiac silhouette. Mild central venous congestion. No focal infiltrate. No pneumothorax. IMPRESSION: Mild central venous congestion. Electronically Signed   By: Suzy Bouchard M.D.   On: 10/29/2018 15:41   Ct Renal Stone Study  Result Date: 10/29/2018 CLINICAL DATA:  LEFT flank pain abdominal pain.  Nausea EXAM: CT ABDOMEN AND PELVIS WITHOUT CONTRAST TECHNIQUE: Multidetector CT imaging of the abdomen and pelvis was performed following the standard protocol without IV contrast. COMPARISON:  10/27/2018 FINDINGS: Lower chest: Lung bases clear Hepatobiliary: No focal hepatic lesion.  Gallbladder normal. Pancreas: Pancreas normal Spleen: Normal spleen Adrenals/Urinary Tract: Adrenal glands are normal. Mild renal edema, hydronephrosis, and  hydroureter of the LEFT kidney again noted. The distal LEFT ureteral calculus has migrated approximately 1 cm compared to exam 10/27/2018. Calculus position at the level the LEFT operator space measuring 4 mm by 6 mm (image 77/3). Calculus is approximately 2 cm from the vesicoureteral junction. No nephrolithiasis.  No RIGHT ureterolithiasis.  No bladder calculi Stomach/Bowel: Stomach, small-bowel, appendix and cecum normal. The colon rectosigmoid colon normal. Vascular/Lymphatic: Abdominal aorta is normal caliber. There is no retroperitoneal or periportal lymphadenopathy. No pelvic lymphadenopathy. Reproductive: Post hysterectomy. Cystic enlargement of the RIGHT ovary to 4.2 x 3.8 cm again noted. Cystic component measures approximately 3.1 cm. Other: No free fluid Musculoskeletal: No aggressive osseous lesion IMPRESSION: 1. Partially obstructing calculus in the distal LEFT ureter has migrated approximately 1 cm towards the vesicoureteral junction. 2. Cystic lesion of the RIGHT ovary. No follow-up necessary in this age group. This recommendation follows ACR consensus guidelines: White Paper of the ACR Incidental Findings Committee II on Adnexal Findings. J Am Coll Radiol [phone]. Electronically Signed   By: Suzy Bouchard M.D.   On: 10/29/2018 15:37    Procedures Procedures (including critical care time)  Medications Ordered in ED Medications  acetaminophen (TYLENOL) tablet 650 mg (650  mg Oral Not Given 10/29/18 1517)  vancomycin (VANCOCIN) 2,000 mg in sodium chloride 0.9 % 500 mL IVPB (has no administration in time range)  vancomycin (VANCOCIN) 1,750 mg in sodium chloride 0.9 % 500 mL IVPB (has no administration in time range)  sodium chloride 0.9 % bolus 1,000 mL (has no administration in time range)  ondansetron (ZOFRAN) injection 4 mg (has no administration in time range)  fentaNYL (SUBLIMAZE) injection 100 mcg (has no administration in time range)  ketorolac (TORADOL) 30 MG/ML injection 15  mg (has no administration in time range)  acetaminophen (TYLENOL) tablet 650 mg (650 mg Oral Given 10/29/18 1337)  sodium chloride 0.9 % bolus 1,000 mL (1,000 mLs Intravenous New Bag/Given 10/29/18 1504)  ceFEPIme (MAXIPIME) 2 g in sodium chloride 0.9 % 100 mL IVPB (2 g Intravenous New Bag/Given 10/29/18 1505)     Initial Impression / Assessment and Plan / ED Course  I have reviewed the triage vital signs and the nursing notes.  Pertinent labs & imaging results that were available during my care of the patient were reviewed by me and considered in my medical decision making (see chart for details).        48 year old female with recent diagnosis of 5 mm kidney stone on the left side presents today for fever, worsening abdominal pain, nausea, hematuria.  On initial ED arrival, she is febrile, tachycardic, hypertensive, tachypneic.  On exam, she appears uncomfortable.  Patient with left-sided CVA tenderness as well as left lower quadrant abdominal tenderness.  Concern for infected stone.  She does report she has shortness of breath when she gets extreme pain but otherwise has not had any cough, difficulty breathing.  Suspect that she may be septic from infected stone.  Code sepsis initiated.  Patient started on broad-spectrum antibiotics.  I-STAT beta negative.  CBC shows no leukocytosis.  Hemoglobin stable at 15.1.  Platelets are 70.  2 days ago, her platelets were 221.  Lactic is elevated at 3.2.  BMP shows sodium 134, BUN of 16, creatinine of 1.59.  This is an increase from her previous creatinine done on the 25th which was 0.90. UA shows small amount of pyuria.   CT renal study shows partially obstructing calculus in the distal left ureter that is migrated proxy 1 cm towards the vesicular ureteral junction.  Cystic lesion of the right ovary.  Chest x-ray shows mild central venous congestion.   Given concerns of possible infected kidney stone, will plan to discuss with urology.  Discussed  patient with Dr. Alinda Money (Urology).  He will plan to see patient in consultation.  At this time, patient is still hypotensive despite second liters of fluid.  I do not feel that transfer is best as patient is unstable.  He recommends medical admission versus PCCM admission and he will plan to consult and have patient get a nephrostomy tube.  Request that patient be n.p.o.  Discussed patient with Dr. Tamala Julian (PCCM). Feel that patient is reasonable for medical admission. Patient signed out to oncoming provider.   Portions of this note were generated with [employer]. Dictation errors may occur despite best attempts at proofreading.   Final Clinical Impressions(s) / ED Diagnoses   Final diagnoses:  Sepsis, due to unspecified organism, unspecified whether acute organ dysfunction present [facility])  Kidney stone    ED Discharge Orders    None       Desma Mcgregor 11/01/18 1950    Charlesetta Shanks, MD 11/03/18 1041

## 2018-10-29 NOTE — Procedures (Signed)
Interventional Radiology Procedure Note  Procedure: Left percutaneous nephrostomy tube placement  Complications: None  Estimated Blood Loss: < 10 mL  Findings: After LP access of left kidney, urine return clear but foul-smelling. Collecting system partially duplicated. After 10 Fr PCN placement, urine return is bloody. Urine sample sent for culture. PCN to gravity bag.   Venetia Night. Kathlene Cote, M.D Pager:  [phone]

## 2018-10-29 NOTE — Progress Notes (Deleted)
FPTS Interim Progress Note   S:  History provided by patient and pt's husband.   Tamara Watson is a 48 y.o. female presenting with worsening left flank and left abdominal pain. Patient started having pain on 9/25 was evaluated in the ED and found to have nephrolithiasis.  Patient was sent home with pain medication, Flomax and Zofran. On 9/26, she had worsening shooting-like abdominal pain and states she got "weaker and weaker." This morning, pain did not improve and she began having a headache, chills, fever, generalized weakness, dizziness so she came to the ED. Presentation in the ED was concerning for sepsis as patient. Patient denies history of kidney stones. Denies dysuria, hematuria,  Has been eating and drinking well at home. States pain is better controlled with IV pain medication.  No recent changes in bowel or bladder habits although she endorses chronic constipation. Last normal bowel movement was 9/24.   ROS: See HPI.   O: BP (!) 82/59   Pulse (!) 131   Temp (!) 100.5 F (38.1 C) (Rectal)   Resp 17   Ht 5' 5.5" (1.664 m)   Wt 92.1 kg   LMP 06/05/2018   SpO2 97%   BMI 33.27 kg/m     GEN:     alert, uncomfortable appearing, in no acute distress  HENT:  mucus membranes dry, no oropharyngeal lesions or erythema ,  nares patent, no nasal discharge  EYES:   pupils equal and reactive, EOM intact NECK:  supple, normal ROM, no lymphadenopathy  RESP:  clear to auscultation bilaterally, no increased work of breathing CVS:   tachycardic, regular rhythm, no murmur, distal pulses intact   ABD:  soft, LUQ and LLQ tenderness; bowel sounds present; no palpable masses, no guarding, no rebound, + left CVA tenderness EXT:   normal ROM, atraumatic, no lower extremity edema  NEURO:  normal without focal findings,  speech normal, alert and oriented   Skin:   warm and dry, normal skin turgor Psych: Normal affect and thought content     A/P: Please see H&P for full assessment and  plan.   Lyndee Hensen, MD 10/29/2018, 10:46 PM PGY-1, [place] [facility] Service pager [phone]

## 2018-10-29 NOTE — H&P (Addendum)
[facility] Admission History and Physical Service Pager: [phone]  Patient name: Tamara Watson Medical record number: [id number] Date of birth: 1970-12-15 Age: 48 y.o. Gender: female  Primary Care Provider: [name], [name] Him, MD Consultants: Urology, IR, CCM Code Status: unknown Preferred Emergency Contact: unknown  Chief Complaint: Left flank pain, febrile, tachycardic, hypotensive  Assessment and Plan: Tamara Watson is a 48 y.o. female presenting with presumed sepsis with lactic acidosis most likely due to ureteral stone . PMH is significant for history of anemia due to menorrhagia and fibroids (s/p partial hysterectomy 06/2018), anxiety, constipation, GERD, and seasonal allergies.  Urosepsis with lactic acidosis 2/2 to Left Ureteral stone Patient presented on Friday 9/25 with 5 mm Left ureteral stone.  Today she returns with fever to 101.64F, flank pain, lactic acidosis and meeting 3/4 SIRS criteria (tachycardia, tachypnea, hypotension) with QSOFA of 2. No leukocytosis. Creatinine elevate to 1.53 (baseline 0.7-0.9). UA positive for small amount of pyuria, small hemoglobin.  CT renal study demonstrates partially obstructing 5 mm calculus in distal left ureter that has migrated proximally by 1 cm towards the vesicular ureteral junction.  Chest x-ray reveals mild central venous congestion. Patient started on vancomycin and cefepime in the ED. Urology was consulted and recommended emergent placement of left nephrostomy tube by IR. Patient could not be examined initially by FPTS team due to procedure. S/p left nephrostomy tube placement, she became severely tachycardic to 180's, tachypniec to 40's, and febrile to 100.5. Will order 1L NS bolus, obtain CXR, troponin, EKG.  -Admit to FPTS, progressive, Attending Dr. Ardelia Mems - s/p Left nephrostomy tube placement by IR - Urology consulted, appreciate recs - continue Vanc/Cefepime - Follow up blood,  urine cultures  - Continuous cardiac monitoring, pulse ox - Trend LA - Tylenol PRN, Sofran PRN - Trend troponins - EKG ordered - consider CCM consult if continues to decline - F/u CXR, vitals - 11mL NS mIVF - continue Vanc/Cefepime - FPTS team to reassess after procedure  AKI:  Cr elevated to 1.53, up from baseline of 0.9 just 2-3 days ago. Likely secondary to acute infection. S/p 4L NS bolus.   - continue 164mL NS mIVF - AM BMP  Severe Tachycardia: Heart rates elevated to max 180's after nephrostomy procedure in setting of fever and tachypnea. Improved slowly with an additional 1L NS bolus and tylenol PR. EKG and troponins obtained to evaluate heart strain. EKG notable for SVT. HS Trop level at 39. - continue to monitor  Thrombocytopenia  Concern for DIC: On 9/25 patient had platelet count of 221.  Today patient presents with platelets dropped to 70.  PT/INR both elevated to 17, 1.4 respectively.  Hemoglobin stable at 15. Fibrinogen >800, D-dimer 11.87.  Could be due to sepsis vs concern for DIC as her labs are highly suspicious. Hold heparin, Lovenox, only SCDs for VTE ppx.  Have not been able to obtain medication list from patient to rule out potential causes of medication induced thrombocytopenia. - follow up blood smear - CBCs q6 hours  - obtain accurate medication list - closely monitor for bleeding, particularly at nephrostomy site  - transfuse platelets <50K, transfuse RBC if Hgb <7 - low threshold for imaging to rule out VTE/PE  - Treatment for DIC is treating the underlying cause and supportive measures thus this will be our focus going forward with further work up if no improvement in clinical status   Hypokalemia Potassium 3.1 on BMP today.  Potassium repletion via IV fluids while  patient n.p.o. - 42mEq Kdur x 3 - AM BMP  H/O Anemia H/O fibroids, menorrhagia, s/p hysterectomy in 06/2018. Baseline Hgb 13.0 (07/2018). Hgb 15.0 on admission, ikely hemoconcentrated.  -  home med Fe polysaccharide 150mg  PO  - monitor with CBC  Anxiety Unable to obtain information from patient. -Obtain accurate med list and PMH  Constipation Home medications include colace, linzess 72, senna -obtain accurate med list  GERD Home meds listed as pantoprazole or dexilant - obtain accurate med list and PMH  Seasonal allergies Home medications include flonase -obtain accurate med list and PMH  FEN/GI: NPO Prophylaxis: SCDs  Disposition: to progressive, possibly CCM pending consult  History of Present Illness:  Tamara Watson is a 48 y.o. female presenting with urosepsis with LA of 3.2 2/2 L ureteral stone.  Unable to obtain further information from patient as she was in an emergent procedure with IR.  She is now unstable and unable to answer questions, will obtain further information when she is able.   History provided by patient and pt's husband.   Tamara Watson is a 48 y.o. female presenting with worsening left flank and left abdominal pain. Patient started having pain on 9/25 was evaluated in the ED and found to have nephrolithiasis.  Patient was sent home with pain medication, Flomax and Zofran. On 9/26, she had worsening shooting-like abdominal pain and states she got "weaker and weaker." This morning, pain did not improve and she began having a headache, chills, fever, generalized weakness, dizziness so she came to the ED. Presentation in the ED was concerning for sepsis as patient. Patient denies history of kidney stones. Denies dysuria, hematuria,  Has been eating and drinking well at home. States pain is better controlled with IV pain medication.  No recent changes in bowel or bladder habits although she endorses chronic constipation. Last normal bowel movement was 9/24.   Review Of Systems: Per HPI  See above    Patient Active Problem List   Diagnosis Date Noted  . Sepsis ([place]) 10/29/2018  . Fibroid, uterine 06/14/2018  . Menorrhagia  06/14/2018  . Iron deficiency anemia due to chronic blood loss 05/24/2018  . GERD (gastroesophageal reflux disease) 01/09/2018  . Encounter for annual physical exam 01/09/2018  . Dysphagia 11/10/2016  . Abdominal pain, chronic, epigastric 03/03/2016  . Allergic rhinitis 03/01/2014  . Iron deficiency anemia 05/07/2011  . PALPITATIONS 04/23/2009  . NONSPEC REACT TUBERCULIN SKN TEST W/O ACTV TB 07/03/2008  . MENORRHAGIA 06/16/2007  . ALOPECIA 06/16/2007  . OBESITY 06/08/2006  . DYSMENORRHEA 06/08/2006  . ANXIETY 03/31/2006    Past Medical History: Past Medical History:  Diagnosis Date  . Allergy   . Anemia   . Anxiety    in school only   . Constipation    sennocot and miralax   . Dyspnea    when hemoglobin low  . GERD (gastroesophageal reflux disease)   . Headache    due to Iron deficency    Past Surgical History: Past Surgical History:  Procedure Laterality Date  . BREAST LUMPECTOMY Left    20 yrs ago- benign   . CYSTOSCOPY  06/14/2018   Procedure: Cystoscopy;  Surgeon: Waymon Amato, MD;  Location: [place];  Service: Gynecology;;  . DILATION AND CURETTAGE OF UTERUS     x2  . HYSTERECTOMY ABDOMINAL WITH SALPINGECTOMY Bilateral 06/14/2018   Procedure: HYSTERECTOMY ABDOMINAL WITH  BILATERAL SALPINGECTOMY;  Surgeon: Waymon Amato, MD;  Location: [place];  Service: Gynecology;  Laterality: Bilateral;  .  IR NEPHROSTOMY PLACEMENT LEFT  10/29/2018  . NSVD     x3  . TUBAL LIGATION      Social History: Social History   Tobacco Use  . Smoking status: Never Smoker  . Smokeless tobacco: Never Used  Substance Use Topics  . Alcohol use: Yes    Alcohol/week: 1.0 standard drinks    Types: 1 Glasses of wine per week    Frequency: Never    Comment: ocassional  . Drug use: Never   Additional social history:  Please also refer to relevant sections of EMR.  Family History: Family History  Problem Relation Age of Onset  . Cancer Maternal Aunt   . Ovarian cancer Maternal Aunt   .  Cancer Paternal Grandmother   . Pancreatic cancer Paternal Grandmother   . Hyperlipidemia Mother   . Diabetes Mother   . Hyperlipidemia Father   . Breast cancer Maternal Aunt   . Colon cancer Neg Hx   . Esophageal cancer Neg Hx   . Stomach cancer Neg Hx   . Rectal cancer Neg Hx   . Colon polyps Neg Hx     Allergies and Medications: No Known Allergies No current facility-administered medications on file prior to encounter.    Current Outpatient Medications on File Prior to Encounter  Medication Sig Dispense Refill  . acetaminophen (TYLENOL) 500 MG tablet Take 1,000 mg by mouth every 6 (six) hours as needed for headache.    . dexlansoprazole (DEXILANT) 60 MG capsule TAKE 1 CAPSULE BY MOUTH DAILY BEFORE BREAKFAST. (Patient taking differently: Take 60 mg by mouth daily before breakfast. ) 90 capsule 3  . fluticasone (FLONASE) 50 MCG/ACT nasal spray Place 2 sprays into both nostrils daily. (Patient taking differently: Place 2 sprays into both nostrils daily as needed for allergies. ) 16 g 1  . HYDROcodone-acetaminophen (NORCO/VICODIN) 5-325 MG tablet Take 1 tablet by mouth every 6 (six) hours as needed for up to 3 days for severe pain. 10 tablet 0  . ibuprofen (ADVIL) 200 MG tablet Take 600 mg by mouth every 6 (six) hours as needed for headache (pain).    . ondansetron (ZOFRAN) 4 MG tablet Take 1 tablet (4 mg total) by mouth every 6 (six) hours. 12 tablet 0  . oxyCODONE-acetaminophen (PERCOCET/ROXICET) 5-325 MG tablet Take 1-2 tablets by mouth every 6 (six) hours as needed for moderate pain (once the patient tolerates po and PCA has been discontinued). (Patient taking differently: Take 1-2 tablets by mouth every 6 (six) hours as needed for moderate pain. ) 30 tablet 0  . senna-docusate (SENOKOT-S) 8.6-50 MG tablet Take 2 tablets by mouth at bedtime as needed for mild constipation or moderate constipation. 40 tablet 1  . tamsulosin (FLOMAX) 0.4 MG CAPS capsule Take 1 capsule (0.4 mg total) by  mouth daily. 30 capsule 0  . vitamin C (ASCORBIC ACID) 500 MG tablet Take 500 mg by mouth daily as needed (immune system boost/ cold symptoms).     . benzonatate (TESSALON) 100 MG capsule Take 1 capsule (100 mg total) by mouth 3 (three) times daily as needed for cough. (Patient not taking: Reported on 10/29/2018) 30 capsule 0  . docusate sodium (COLACE) 100 MG capsule Take 1 capsule (100 mg total) by mouth 2 (two) times daily. (Patient not taking: Reported on 10/29/2018) 40 capsule 0  . ibuprofen (ADVIL) 600 MG tablet Take 1 tablet (600 mg total) by mouth every 6 (six) hours. (Patient not taking: Reported on 10/29/2018) 30 tablet 0  .  lidocaine (LIDODERM) 5 % Place 1 patch onto the skin daily. Remove & Discard patch within 12 hours or as directed by MD (Patient not taking: Reported on 10/29/2018) 30 patch 0  . LINZESS 72 MCG capsule TAKE 1 CAPSULE (72 MCG TOTAL) BY MOUTH DAILY BEFORE BREAKFAST. (Patient not taking: No sig reported) 90 capsule 2  . pantoprazole (PROTONIX) 40 MG tablet Take 1 tablet (40 mg total) by mouth daily. (Patient not taking: Reported on 10/29/2018) 15 tablet 1    Objective: BP 129/77   Pulse (!) 139   Temp (!) 101.4 F (38.6 C) (Oral)   Resp (!) 24   Ht 5' 5.5" (1.664 m)   Wt 92.1 kg   LMP 06/05/2018   SpO2 100%   BMI 33.27 kg/m  Exam:  GEN:     alert, uncomfortable appearing, in no acute distress HENT:  mucus membranes dry, no oropharyngeal lesions or erythema ,  nares patent, no nasal discharge  EYES:   pupils equal and reactive, EOM intact NECK:  supple, normal ROM, no lymphadenopathy  RESP:  clear to auscultation bilaterally, no increased work of breathing CVS:     tachycardic, regular rhythm, no murmur, distal pulses intact   ABD:    soft, LUQ and LLQ tenderness; bowel sounds present; no palpable masses, no guarding, no rebound, + left CVA tenderness EXT:     normal ROM, atraumatic, no lower extremity edema  NEURO:  normal without focal findings,  speech  normal, alert and oriented   Skin:    warm and dry, normal skin turgor Psych: Normal affect and thought content   Labs and Imaging: CBC BMET  Recent Labs  Lab 10/29/18 1348  WBC 7.9  HGB 15.1*  HCT 43.8  PLT 70*   Recent Labs  Lab 10/29/18 1443  NA 132*  K 3.1*  CL 100  CO2 18*  BUN 18  CREATININE 1.53*  GLUCOSE 117*  CALCIUM 7.9*      Dg Chest Port 1 View  Result Date: 10/29/2018 CLINICAL DATA:  Pt states diagnosed with renal stones Friday, states pain has gotten worse and she has been febrile at home, unsure how high. C/o L flank and abdominal pain. Pt denies vomiting but endorses nausea. Denies blood in urine. Last pai.*comment was truncated*Sepsis EXAM: PORTABLE CHEST 1 VIEW COMPARISON:  None. FINDINGS: Normal cardiac silhouette. Mild central venous congestion. No focal infiltrate. No pneumothorax. IMPRESSION: Mild central venous congestion. Electronically Signed   By: Suzy Bouchard M.D.   On: 10/29/2018 15:41   Ct Renal Stone Study  Result Date: 10/29/2018 CLINICAL DATA:  LEFT flank pain abdominal pain.  Nausea EXAM: CT ABDOMEN AND PELVIS WITHOUT CONTRAST TECHNIQUE: Multidetector CT imaging of the abdomen and pelvis was performed following the standard protocol without IV contrast. COMPARISON:  10/27/2018 FINDINGS: Lower chest: Lung bases clear Hepatobiliary: No focal hepatic lesion.  Gallbladder normal. Pancreas: Pancreas normal Spleen: Normal spleen Adrenals/Urinary Tract: Adrenal glands are normal. Mild renal edema, hydronephrosis, and hydroureter of the LEFT kidney again noted. The distal LEFT ureteral calculus has migrated approximately 1 cm compared to exam 10/27/2018. Calculus position at the level the LEFT operator space measuring 4 mm by 6 mm (image 77/3). Calculus is approximately 2 cm from the vesicoureteral junction. No nephrolithiasis.  No RIGHT ureterolithiasis.  No bladder calculi Stomach/Bowel: Stomach, small-bowel, appendix and cecum normal. The colon  rectosigmoid colon normal. Vascular/Lymphatic: Abdominal aorta is normal caliber. There is no retroperitoneal or periportal lymphadenopathy. No pelvic lymphadenopathy. Reproductive: Post  hysterectomy. Cystic enlargement of the RIGHT ovary to 4.2 x 3.8 cm again noted. Cystic component measures approximately 3.1 cm. Other: No free fluid Musculoskeletal: No aggressive osseous lesion IMPRESSION: 1. Partially obstructing calculus in the distal LEFT ureter has migrated approximately 1 cm towards the vesicoureteral junction. 2. Cystic lesion of the RIGHT ovary. No follow-up necessary in this age group. This recommendation follows ACR consensus guidelines: White Paper of the ACR Incidental Findings Committee II on Adnexal Findings. J Am Coll Radiol [phone]. Electronically Signed   By: Suzy Bouchard M.D.   On: 10/29/2018 15:37   Ir Nephrostomy Placement Left  Result Date: 10/29/2018 CLINICAL DATA:  Obstructing left ureteral calculus, sepsis and need for emergent percutaneous nephrostomy tube placement. EXAM: 1. ULTRASOUND GUIDANCE FOR PUNCTURE OF THE LEFT RENAL COLLECTING SYSTEM. 2. LEFT PERCUTANEOUS NEPHROSTOMY TUBE PLACEMENT. COMPARISON:  CT of the abdomen and pelvis earlier today. ANESTHESIA/SEDATION: 2.0 mg IV Versed; 100 mcg IV Fentanyl. Total Moderate Sedation Time 22 minutes. The patient's level of consciousness and physiologic status were continuously monitored during the procedure by Radiology nursing. CONTRAST:  20 mL Omnipaque 300 MEDICATIONS: No additional medications. A scheduled dose of IV vancomycin was infusing during the procedure. FLUOROSCOPY TIME:  1 minutes and 36 seconds.  12.4 mGy. PROCEDURE: The procedure, risks, benefits, and alternatives were explained to the patient. Questions regarding the procedure were encouraged and answered. The patient understands and consents to the procedure. A time-out was performed prior to initiating the procedure. The left flank region was prepped with  chlorhexidine in a sterile fashion, and a sterile drape was applied covering the operative field. A sterile gown and sterile gloves were used for the procedure. Local anesthesia was provided with 1% Lidocaine. Ultrasound was used to localize the left kidney. Under direct ultrasound guidance, a 21 gauge needle was advanced into the renal collecting system. Ultrasound image documentation was performed. Aspiration of urine sample was performed followed by contrast injection. A transitional dilator was advanced over a guidewire. Percutaneous tract dilatation was then performed over the guidewire. A 10-French percutaneous nephrostomy tube was then advanced and formed in the collecting system. Catheter position was confirmed by fluoroscopy after contrast injection. The catheter was secured at the skin with a Prolene retention suture and Stat-Lock device. A gravity bag was placed. COMPLICATIONS: None. FINDINGS: Ultrasound demonstrates hydronephrosis. After puncture of the lower pole collecting system, there was return of clear but foul-smelling urine. 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 After nephrostomy tube placement, the tube could not be completely formed in the renal pelvis due to the duplicated nature of the collecting system and small size of the renal pelvis. There was some acute bleeding after nephrostomy tube placement with bloody urine return after tube placement. A urine sample was sent for culture analysis. IMPRESSION: Placement of 10 French left percutaneous nephrostomy tube. The tube was attached to gravity bag drainage. Initial urine output is bloody after tube placement. A urine sample was sent for culture analysis. Electronically Signed   By: Aletta Edouard M.D.   On: 10/29/2018 19:23    Gladys Damme, MD 10/29/2018, 7:48 PM PGY-1, [place] Intern pager: [phone], text pages welcome  Lyndee Hensen, MD 10/29/2018, 10:46 PM PGY-1,  [facility]  Upper Level Addendum: I have seen and evaluated this patient along with Dr. Chauncey Reading and reviewed the above note, making necessary revisions in green.   Mina Marble, D.O. 10/29/2018, 10:58 PM PGY-2, [place] [facility]

## 2018-10-29 NOTE — Progress Notes (Signed)
Notified bedside nurse of need to draw lactic acid.  Bedside Rn notified in secure chat of needed repeat lactic that already ordered. RN will have some help with getting this repeat lab.

## 2018-10-29 NOTE — Progress Notes (Addendum)
Pharmacy Antibiotic Note  Tamara Watson is a 48 y.o. female admitted on 10/29/2018 with sepsis.  Pharmacy has been consulted for vancomycin dosing. Cefepime also ordered x1 per EDP. SCr 1.59 on admit.  Plan: Cefepime 2g IV x 1 - F/u if needs to continue Vancomycin 2g IV x1; then Vancomycin 1750 mg IV Q 48 hrs. Goal AUC 400-550. Expected AUC: 494 SCr used: 1.59 Monitor clinical progress, c/s, renal function F/u de-escalation plan/LOT, vancomycin levels as indicated   Height: 5' 5.5" (166.4 cm) Weight: 203 lb (92.1 kg) IBW/kg (Calculated) : 58.15  Temp (24hrs), Avg:101.4 F (38.6 C), Min:101.4 F (38.6 C), Max:101.4 F (38.6 C)  Recent Labs  Lab 10/27/18 0750 10/29/18 1348  WBC 3.7* 7.9  CREATININE 0.90 1.59*  LATICACIDVEN  --  3.2*    Estimated Creatinine Clearance: 50.6 mL/min (A) (by C-G formula based on SCr of 1.59 mg/dL (H)).    No Known Allergies  Antimicrobials this admission: 9/27 vancomycin >>  9/27 cefepime x1  Dose adjustments this admission:   Microbiology results:   Elicia Lamp, PharmD, BCPS Clinical Pharmacist 10/29/2018 2:32 PM

## 2018-10-29 NOTE — Progress Notes (Signed)
Pharmacy Antibiotic Note  Tamara Watson is a 48 y.o. female admitted on 10/29/2018 with sepsis.  Pharmacy has been consulted for vancomycin dosing. Cefepime also ordered x1 per EDP. SCr 1.59 on admit.  Pharmacy asked to continue cefepime.  Plan: Cefepime 2g IV q12h  Height: 5' 5.5" (166.4 cm) Weight: 203 lb (92.1 kg) IBW/kg (Calculated) : 58.15  Temp (24hrs), Avg:101 F (38.3 C), Min:100.5 F (38.1 C), Max:101.4 F (38.6 C)  Recent Labs  Lab 10/27/18 0750 10/29/18 1348 10/29/18 1443 10/29/18 1726 10/29/18 2011  WBC 3.7* 7.9  --   --   --   CREATININE 0.90 1.59* 1.53*  --   --   LATICACIDVEN  --  3.2*  --  2.5* 4.4*    Estimated Creatinine Clearance: 52.6 mL/min (A) (by C-G formula based on SCr of 1.53 mg/dL (H)).    No Known Allergies  Antimicrobials this admission: 9/27 vancomycin >>  9/27 cefepime >>  Dose adjustments this admission:   Microbiology results:   Arrie Senate, PharmD, BCPS Clinical Pharmacist [phone] Please check AMION for all [place] numbers 10/29/2018

## 2018-10-29 NOTE — ED Triage Notes (Addendum)
Pt states diagnosed with renal stones Friday, states pain has gotten worse and she has been febrile at home, unsure how high. C/o L flank and abdominal pain. Pt denies vomiting but endorses nausea. Denies blood in urine. Last pain meds taken earlier this morning, per patient.

## 2018-10-29 NOTE — H&P (Signed)
NAME:  Tamara Watson, MRN:  [id number], DOB:  1970-06-06, LOS: 0 ADMISSION DATE:  10/29/2018, CONSULTATION DATE:  10/29/18  CHIEF COMPLAINT:  Septic shock  Brief History   This is a 48 yo with history of adenomysosis and fibroids presents with urosepsis  History of present illness   This is a 48 yo with history of fibroids sp hysterctomy, anemia, and GERD. Presented to the ED on Friday 9/25 for flank pain. Noted to have nephrolithiasis. Sent home with pain mediciaots and flomax. Returns with continued flank pain, nausea, fevers and worsening malais. Had Ct that showed continued hydroehoroisis on left and partially obstructing calculus in the distal left ureter that had migrated about 1 cm towards vesicoureteral junction. Patient is popsitive for fevers at home. Notes dififculty drinking and eating because of nausea. Notes no blood in urine or bowels. Bowel habits normal. Urinating still. No chest pain or SOB.   Past Medical History  Fibroids Reflux   Significant Hospital Events   -Urology consulted recommended nephrostomy tube as patient unstable for OR -IR consulted and nephrostomy tube placed  Consults:  -Urology and IR  Procedures:  Nephrostomy tube  Significant Diagnostic Tests:  CT abdomen and pelvis on 9/27 noting  1. Partially obstructing calculus in the distal LEFT ureter has migrated approximately 1 cm towards the vesicoureteral junction. 2. Cystic lesion of the RIGHT ovary. No follow-up necessary in this age group. This recommendation follows ACR consensus guidelines: White Paper of the ACR Incidental Findings Committee II on Adnexal Findings. J Am Coll Radiol [phone].   Micro Data:  [place] 9/27- pending BCX 9/27-pending  Antimicrobials:  Cefepime and vand for now  Interim history/subjective:  NA  Objective   Blood pressure (!) 82/59, pulse (!) 131, temperature (!) 100.5 F (38.1 C), temperature source Rectal, resp. rate 17, height 5' 5.5"  (1.664 m), weight 92.1 kg, last menstrual period 06/05/2018, SpO2 97 %.       No intake or output data in the 24 hours ending 10/29/18 2314 Filed Weights   10/29/18 1329  Weight: 92.1 kg    Examination: General: Patient alert and oriented answering questions appropriately but does appear mildly distressed  HENT: cracked lips and dry mucosa Lungs: CTAB. No rhonchi or crackles noted  Cardiovascular: tachycardic but regular. No murmurs Abdomen: Generalized abdominal pain. Minimal distension and no peritoneal signs Extremities: With no lesions. Moving all spontaneously  Neuro: Follows commands with no focal deficits.  GU: Nephrostomy tube in place with sanguineous drainage.   Resolved Hospital Problem list   NA  Assessment & Plan:  This is a 48 yo female with history as noted above who presents for worsening sepsis evolving into shock after IR placed nephrostomy tube for obstruction in the setting of known nephrolithiasis  Septic shock 2/2 =Pyelonephritis 2/2 partially obstructive stone-SP IR drain -Continue Vanc and Cefepime for now -FU bcx and UCX -Continue to follow up with urology for future interventions as patient stabilizes -Start pressors if MAP less than 60. Given that patient has received 30 cc/kg and 1.5 liters on top of that. IF this is done will attain central line if levo goes above 10 -Continue to trend lactic acid -Dilaudid for pain -NPO for now can advance diet as tolerated once patient clinically stabilized  AKI-creatinine elevated at 1.53 from 0.9 on 9/25-likely secondary to obstructive uropathy and probably some element of ATN. -Avoid nephrotoxins if able -Renally dose medicaitons  Thrombocytopenia-Patient with notable platelets of 70 from 221 on 10/27/18. Hgb  stable which is reassuring and so is fibrinogen -Continue to monitor with daily CBC as this is likely from infection -If continues to fall would DC heaprin and send PF4 assay  Tachycardia-likely Sinus  and 2/2 sepsis. Second EKG with rate in 180's and fairly irregular concerning for A FIB however significant artifact and difficult to interpret -Repeat EKG and treat as indicated      Best practice:  Diet: NPO for now Pain/Anxiety/Delirium protocol (if indicated): Dilaudid prn VAP protocol (if indicated): NA DVT prophylaxis: heparin for now GI prophylaxis: NA Glucose control:NA Mobility: as toelrated Code Status: Full Family Communication: Husband at bedside and updated  Disposition: TBD  Labs   CBC: Recent Labs  Lab 10/27/18 0750 10/29/18 1348  WBC 3.7* 7.9  NEUTROABS 1.3*  --   HGB 14.3 15.1*  HCT 44.2 43.8  MCV 90.8 88.1  PLT 221 70*    Basic Metabolic Panel: Recent Labs  Lab 10/27/18 0750 10/29/18 1348 10/29/18 1443  NA 139 134* 132*  K 3.6 3.5 3.1*  CL 105 98 100  CO2 25 22 18*  GLUCOSE 135* 112* 117*  BUN 12 16 18   CREATININE 0.90 1.59* 1.53*  CALCIUM 8.8* 8.5* 7.9*   GFR: Estimated Creatinine Clearance: 52.6 mL/min (A) (by C-G formula based on SCr of 1.53 mg/dL (H)). Recent Labs  Lab 10/27/18 0750 10/29/18 1348 10/29/18 1726 10/29/18 2011  WBC 3.7* 7.9  --   --   LATICACIDVEN  --  3.2* 2.5* 4.4*    Liver Function Tests: Recent Labs  Lab 10/27/18 0750 10/29/18 1443  AST 18 25  ALT 15 20  ALKPHOS 49 71  BILITOT 0.8 1.2  PROT 7.2 6.1*  ALBUMIN 3.8 2.8*   Recent Labs  Lab 10/27/18 0750  LIPASE 29   No results for input(s): AMMONIA in the last 168 hours.  ABG No results found for: PHART, PCO2ART, PO2ART, HCO3, TCO2, ACIDBASEDEF, O2SAT   Coagulation Profile: Recent Labs  Lab 10/29/18 1443  INR 1.4*    Cardiac Enzymes: No results for input(s): CKTOTAL, CKMB, CKMBINDEX, TROPONINI in the last 168 hours.  HbA1C: No results found for: HGBA1C  CBG: No results for input(s): GLUCAP in the last 168 hours.  Review of Systems:   Pertinent positives and negative per HPI. 14 point review negative as per above.  Past Medical  History  She,  has a past medical history of Allergy, Anemia, Anxiety, Constipation, Dyspnea, GERD (gastroesophageal reflux disease), and Headache.   Surgical History    Past Surgical History:  Procedure Laterality Date  . BREAST LUMPECTOMY Left    20 yrs ago- benign   . CYSTOSCOPY  06/14/2018   Procedure: Cystoscopy;  Surgeon: Waymon Amato, MD;  Location: [facility];  Service: Gynecology;;  . DILATION AND CURETTAGE OF UTERUS     x2  . HYSTERECTOMY ABDOMINAL WITH SALPINGECTOMY Bilateral 06/14/2018   Procedure: HYSTERECTOMY ABDOMINAL WITH  BILATERAL SALPINGECTOMY;  Surgeon: Waymon Amato, MD;  Location: [facility];  Service: Gynecology;  Laterality: Bilateral;  . IR NEPHROSTOMY PLACEMENT LEFT  10/29/2018  . NSVD     x3  . TUBAL LIGATION       Social History   reports that she has never smoked. She has never used smokeless tobacco. She reports current alcohol use of about 1.0 standard drinks of alcohol per week. She reports that she does not use drugs.   Family History   Her family history includes Breast cancer in her maternal aunt; Cancer in her  maternal aunt and paternal grandmother; Diabetes in her mother; Hyperlipidemia in her father and mother; Ovarian cancer in her maternal aunt; Pancreatic cancer in her paternal grandmother. There is no history of Colon cancer, Esophageal cancer, Stomach cancer, Rectal cancer, or Colon polyps.   Allergies No Known Allergies   Home Medications  Prior to Admission medications   Medication Sig Start Date End Date Taking? Authorizing Provider  acetaminophen (TYLENOL) 500 MG tablet Take 1,000 mg by mouth every 6 (six) hours as needed for headache.   Yes [provider]  dexlansoprazole (DEXILANT) 60 MG capsule TAKE 1 CAPSULE BY MOUTH DAILY BEFORE BREAKFAST. Patient taking differently: Take 60 mg by mouth daily before breakfast.  08/01/18  Yes Nandigam, Venia Minks, MD  fluticasone (FLONASE) 50 MCG/ACT nasal spray Place 2 sprays into both nostrils daily.  Patient taking differently: Place 2 sprays into both nostrils daily as needed for allergies.  11/08/16  Yes Rogue Bussing, MD  HYDROcodone-acetaminophen (NORCO/VICODIN) 5-325 MG tablet Take 1 tablet by mouth every 6 (six) hours as needed for up to 3 days for severe pain. 10/27/18 10/30/18 Yes Madilyn Hook A, PA-C  ibuprofen (ADVIL) 200 MG tablet Take 600 mg by mouth every 6 (six) hours as needed for headache (pain).   Yes [provider]  ondansetron (ZOFRAN) 4 MG tablet Take 1 tablet (4 mg total) by mouth every 6 (six) hours. 10/27/18  Yes Alveria Apley, PA-C  oxyCODONE-acetaminophen (PERCOCET/ROXICET) 5-325 MG tablet Take 1-2 tablets by mouth every 6 (six) hours as needed for moderate pain (once the patient tolerates po and PCA has been discontinued). Patient taking differently: Take 1-2 tablets by mouth every 6 (six) hours as needed for moderate pain.  06/16/18  Yes Waymon Amato, MD  senna-docusate (SENOKOT-S) 8.6-50 MG tablet Take 2 tablets by mouth at bedtime as needed for mild constipation or moderate constipation. 06/16/18  Yes Waymon Amato, MD  tamsulosin (FLOMAX) 0.4 MG CAPS capsule Take 1 capsule (0.4 mg total) by mouth daily. 10/27/18 11/26/18 Yes Madilyn Hook A, PA-C  vitamin C (ASCORBIC ACID) 500 MG tablet Take 500 mg by mouth daily as needed (immune system boost/ cold symptoms).    Yes [provider]  benzonatate (TESSALON) 100 MG capsule Take 1 capsule (100 mg total) by mouth 3 (three) times daily as needed for cough. Patient not taking: Reported on 10/29/2018 01/09/18   [place] Bing, DO  docusate sodium (COLACE) 100 MG capsule Take 1 capsule (100 mg total) by mouth 2 (two) times daily. Patient not taking: Reported on 10/29/2018 06/16/18   Waymon Amato, MD  ibuprofen (ADVIL) 600 MG tablet Take 1 tablet (600 mg total) by mouth every 6 (six) hours. Patient not taking: Reported on 10/29/2018 06/19/18   Waymon Amato, MD  lidocaine (LIDODERM) 5 % Place 1 patch onto the  skin daily. Remove & Discard patch within 12 hours or as directed by MD Patient not taking: Reported on 10/29/2018 06/16/18   Waymon Amato, MD  LINZESS 72 MCG capsule TAKE 1 CAPSULE (72 MCG TOTAL) BY MOUTH DAILY BEFORE BREAKFAST. Patient not taking: No sig reported 11/03/17   Mauri Pole, MD  pantoprazole (PROTONIX) 40 MG tablet Take 1 tablet (40 mg total) by mouth daily. Patient not taking: Reported on 10/29/2018 06/17/18   Waymon Amato, MD     Critical care time: 45 mintues

## 2018-10-29 NOTE — ED Provider Notes (Addendum)
   Physical Exam  BP 90/77   Pulse (!) 128   Temp (!) 101.4 F (38.6 C) (Oral)   Resp (!) 24   Ht 5' 5.5" (1.664 m)   Wt 92.1 kg   LMP 06/05/2018   SpO2 100%   BMI 33.27 kg/m   Physical Exam Vitals signs and nursing note reviewed.  Constitutional:      General: She is not in acute distress.    Appearance: She is well-developed. She is not diaphoretic.     Comments: Rigors.  HENT:     Head: Normocephalic and atraumatic.  Eyes:     General: No scleral icterus.    Conjunctiva/sclera: Conjunctivae normal.  Neck:     Musculoskeletal: Normal range of motion.  Cardiovascular:     Rate and Rhythm: Tachycardia present.  Pulmonary:     Effort: Pulmonary effort is normal. No respiratory distress.  Skin:    Findings: No rash.  Neurological:     Mental Status: She is alert.     ED Course/Procedures     .Critical Care Performed by: Delia Heady, PA-C Authorized by: Delia Heady, PA-C   Critical care provider statement:    Critical care time (minutes):  35   Critical care was necessary to treat or prevent imminent or life-threatening deterioration of the following conditions:  Cardiac failure, circulatory failure, sepsis and shock   Critical care was time spent personally by me on the following activities:  Development of treatment plan with patient or surrogate, discussions with consultants, evaluation of patient's response to treatment, examination of patient, re-evaluation of patient's condition, pulse oximetry and review of old charts   I assumed direction of critical care for this patient from another provider in my specialty: no      MDM  Care handed off from previous provider PA [place].  Please see their note for further detail.  Briefly, patient is a 48 year old female presenting with sepsis from what appears to be an infected kidney stone.  Previous provider spoke to urology who will see the patient in consult.  Plan is to admit to medicine service.  5:16 PM I have  spoken to family medicine resident who will admit the patient.   7:44 PM Patient went to IR, returned to ED room tachycardic in 170s. Rigors present. Given IV fluids, Tylenol as she appears febrile. EKG shows sinus tachycardia.  8:04 PM Heart rate improved to 140s.  Patient with improvement in her mental status and rigors.       Delia Heady, PA-C 10/29/18 2200    Drenda Freeze, MD 10/29/18 [phone]

## 2018-10-29 NOTE — Consult Note (Signed)
Urology Consult   Physician requesting consult: Dr. Shirlyn Goltz  Reason for consult:  Left ureteral stone and sepsis  History of Present Illness: Tamara Watson is a 48 y.o. who was seen by Dr. Alyson Ingles last spring when she had a cystotomy noted at the time of hysterectomy and underwent bladder repair.  She presented on 9/25 with left flank pain.  She was diagnosed with a 5 mm left ureteral stone and was discharged home.  She presented today with worsening pain and was noted to be severely tachycardic and hypotensive consistent with sepsis.  She has had chills and subjective fever since Friday night.  Hr was 150s and SBP was 70s when she arrived.  She has been resuscitated and currently HR is 128 and SBP 90s.  Lactate > 3.   She is otherwise healthy.  Denies any blood thinners.  No history of urolithiasis.   She is not felt to be stable for transfer or operative intervention.    Past Medical History:  Diagnosis Date  . Allergy   . Anemia   . Anxiety    in school only   . Constipation    sennocot and miralax   . Dyspnea    when hemoglobin low  . GERD (gastroesophageal reflux disease)   . Headache    due to Iron deficency    Past Surgical History:  Procedure Laterality Date  . BREAST LUMPECTOMY Left    20 yrs ago- benign   . CYSTOSCOPY  06/14/2018   Procedure: Cystoscopy;  Surgeon: Waymon Amato, MD;  Location: [facility];  Service: Gynecology;;  . DILATION AND CURETTAGE OF UTERUS     x2  . HYSTERECTOMY ABDOMINAL WITH SALPINGECTOMY Bilateral 06/14/2018   Procedure: HYSTERECTOMY ABDOMINAL WITH  BILATERAL SALPINGECTOMY;  Surgeon: Waymon Amato, MD;  Location: [facility];  Service: Gynecology;  Laterality: Bilateral;  . NSVD     x3  . TUBAL LIGATION       Current Hospital Medications:  Home meds:  No current facility-administered medications on file prior to encounter.    Current Outpatient Medications on File Prior to Encounter  Medication Sig Dispense Refill  . acetaminophen  (TYLENOL) 500 MG tablet Take 1,000 mg by mouth every 6 (six) hours as needed for headache.    . benzonatate (TESSALON) 100 MG capsule Take 1 capsule (100 mg total) by mouth 3 (three) times daily as needed for cough. 30 capsule 0  . dexlansoprazole (DEXILANT) 60 MG capsule TAKE 1 CAPSULE BY MOUTH DAILY BEFORE BREAKFAST. 90 capsule 3  . docusate sodium (COLACE) 100 MG capsule Take 1 capsule (100 mg total) by mouth 2 (two) times daily. 40 capsule 0  . fluticasone (FLONASE) 50 MCG/ACT nasal spray Place 2 sprays into both nostrils daily. (Patient taking differently: Place 2 sprays into both nostrils daily as needed for allergies. ) 16 g 1  . HYDROcodone-acetaminophen (NORCO/VICODIN) 5-325 MG tablet Take 1 tablet by mouth every 6 (six) hours as needed for up to 3 days for severe pain. 10 tablet 0  . ibuprofen (ADVIL) 600 MG tablet Take 1 tablet (600 mg total) by mouth every 6 (six) hours. 30 tablet 0  . Iron-FA-B Cmp-C-Biot-Probiotic (FUSION PLUS) CAPS Take 1 capsule by mouth daily with lunch.    . lidocaine (LIDODERM) 5 % Place 1 patch onto the skin daily. Remove & Discard patch within 12 hours or as directed by MD 30 patch 0  . LINZESS 72 MCG capsule TAKE 1 CAPSULE (72 MCG TOTAL) BY  MOUTH DAILY BEFORE BREAKFAST. (Patient taking differently: Take 72 mcg by mouth daily before breakfast. ) 90 capsule 2  . ondansetron (ZOFRAN) 4 MG tablet Take 1 tablet (4 mg total) by mouth every 6 (six) hours. 12 tablet 0  . oxyCODONE-acetaminophen (PERCOCET/ROXICET) 5-325 MG tablet Take 1-2 tablets by mouth every 6 (six) hours as needed for moderate pain (once the patient tolerates po and PCA has been discontinued). (Patient not taking: Reported on 08/01/2018) 30 tablet 0  . pantoprazole (PROTONIX) 40 MG tablet Take 1 tablet (40 mg total) by mouth daily. 15 tablet 1  . senna-docusate (SENOKOT-S) 8.6-50 MG tablet Take 2 tablets by mouth at bedtime as needed for mild constipation or moderate constipation. 40 tablet 1  .  tamsulosin (FLOMAX) 0.4 MG CAPS capsule Take 1 capsule (0.4 mg total) by mouth daily. 30 capsule 0  . vitamin C (ASCORBIC ACID) 500 MG tablet Take 500 mg by mouth daily.       Scheduled Meds: . acetaminophen  650 mg Oral Once  . ondansetron (ZOFRAN) IV  4 mg Intravenous Once   Continuous Infusions: . sodium chloride    . [START ON 10/31/2018] vancomycin    . vancomycin     PRN Meds:.  Allergies: No Known Allergies  Family History  Problem Relation Age of Onset  . Cancer Maternal Aunt   . Ovarian cancer Maternal Aunt   . Cancer Paternal Grandmother   . Pancreatic cancer Paternal Grandmother   . Hyperlipidemia Mother   . Diabetes Mother   . Hyperlipidemia Father   . Breast cancer Maternal Aunt   . Colon cancer Neg Hx   . Esophageal cancer Neg Hx   . Stomach cancer Neg Hx   . Rectal cancer Neg Hx   . Colon polyps Neg Hx     Social History:  reports that she has never smoked. She has never used smokeless tobacco. She reports current alcohol use of about 1.0 standard drinks of alcohol per week. She reports that she does not use drugs.  ROS: A complete review of systems was performed.  All systems are negative except for pertinent findings as noted.  Physical Exam:  Vital signs in last 24 hours: Temp:  [101.4 F (38.6 C)] 101.4 F (38.6 C) (09/27 1329) Pulse Rate:  [39-152] 128 (09/27 1507) Resp:  [20-24] 24 (09/27 1507) BP: (90-145)/(77-112) 90/77 (09/27 1415) SpO2:  [96 %-100 %] 100 % (09/27 1507) Weight:  [92.1 kg] 92.1 kg (09/27 1329) Constitutional:  Alert and oriented, No acute distress Cardiovascular: Regular and tachycardic, No JVD Respiratory: Normal respiratory effort GI: Abdomen is soft, severe left abdominal tenderness GU: Mild left CVAT Lymphatic: No lymphadenopathy Neurologic: Grossly intact, no focal deficits Psychiatric: Normal mood and affect  Laboratory Data:  Recent Labs    10/27/18 0750 10/29/18 1348  WBC 3.7* 7.9  HGB 14.3 15.1*  HCT 44.2  43.8  PLT 221 70*    Recent Labs    10/27/18 0750 10/29/18 1348 10/29/18 1443  NA 139 134* 132*  K 3.6 3.5 3.1*  CL 105 98 100  GLUCOSE 135* 112* 117*  BUN 12 16 18   CALCIUM 8.8* 8.5* 7.9*  CREATININE 0.90 1.59* 1.53*     Results for orders placed or performed during the hospital encounter of 10/29/18 (from the past 24 hour(s))  CBC     Status: Abnormal   Collection Time: 10/29/18  1:48 PM  Result Value Ref Range   WBC 7.9 4.0 - 10.5 K/uL  RBC 4.97 3.87 - 5.11 MIL/uL   Hemoglobin 15.1 (H) 12.0 - 15.0 g/dL   HCT 43.8 36.0 - 46.0 %   MCV 88.1 80.0 - 100.0 fL   MCH 30.4 26.0 - 34.0 pg   MCHC 34.5 30.0 - 36.0 g/dL   RDW 13.7 11.5 - 15.5 %   Platelets 70 (L) 150 - 400 K/uL   nRBC 0.0 0.0 - 0.2 %  Basic metabolic panel     Status: Abnormal   Collection Time: 10/29/18  1:48 PM  Result Value Ref Range   Sodium 134 (L) 135 - 145 mmol/L   Potassium 3.5 3.5 - 5.1 mmol/L   Chloride 98 98 - 111 mmol/L   CO2 22 22 - 32 mmol/L   Glucose, Bld 112 (H) 70 - 99 mg/dL   BUN 16 6 - 20 mg/dL   Creatinine, Ser 1.59 (H) 0.44 - 1.00 mg/dL   Calcium 8.5 (L) 8.9 - 10.3 mg/dL   GFR calc non Af Amer 39 (L) >60 mL/min   GFR calc Af Amer 45 (L) >60 mL/min   Anion gap 14 5 - 15  Lactic acid, plasma     Status: Abnormal   Collection Time: 10/29/18  1:48 PM  Result Value Ref Range   Lactic Acid, Venous 3.2 (HH) 0.5 - 1.9 mmol/L  I-Stat beta hCG blood, ED     Status: None   Collection Time: 10/29/18  1:58 PM  Result Value Ref Range   I-stat hCG, quantitative <5.0 <5 mIU/mL   Comment 3          Urinalysis, Routine w reflex microscopic- may I&O cath if menses     Status: Abnormal   Collection Time: 10/29/18  2:23 PM  Result Value Ref Range   Color, Urine YELLOW YELLOW   APPearance HAZY (A) CLEAR   Specific Gravity, Urine 1.023 1.005 - 1.030   pH 5.0 5.0 - 8.0   Glucose, UA NEGATIVE NEGATIVE mg/dL   Hgb urine dipstick SMALL (A) NEGATIVE   Bilirubin Urine NEGATIVE NEGATIVE   Ketones, ur 5  (A) NEGATIVE mg/dL   Protein, ur 100 (A) NEGATIVE mg/dL   Nitrite NEGATIVE NEGATIVE   Leukocytes,Ua NEGATIVE NEGATIVE   RBC / HPF 0-5 0 - 5 RBC/hpf   WBC, UA 6-10 0 - 5 WBC/hpf   Bacteria, UA RARE (A) NONE SEEN   Squamous Epithelial / LPF 0-5 0 - 5  Comprehensive metabolic panel     Status: Abnormal   Collection Time: 10/29/18  2:43 PM  Result Value Ref Range   Sodium 132 (L) 135 - 145 mmol/L   Potassium 3.1 (L) 3.5 - 5.1 mmol/L   Chloride 100 98 - 111 mmol/L   CO2 18 (L) 22 - 32 mmol/L   Glucose, Bld 117 (H) 70 - 99 mg/dL   BUN 18 6 - 20 mg/dL   Creatinine, Ser 1.53 (H) 0.44 - 1.00 mg/dL   Calcium 7.9 (L) 8.9 - 10.3 mg/dL   Total Protein 6.1 (L) 6.5 - 8.1 g/dL   Albumin 2.8 (L) 3.5 - 5.0 g/dL   AST 25 15 - 41 U/L   ALT 20 0 - 44 U/L   Alkaline Phosphatase 71 38 - 126 U/L   Total Bilirubin 1.2 0.3 - 1.2 mg/dL   GFR calc non Af Amer 41 (L) >60 mL/min   GFR calc Af Amer 47 (L) >60 mL/min   Anion gap 14 5 - 15  APTT     Status:  None   Collection Time: 10/29/18  2:43 PM  Result Value Ref Range   aPTT 29 24 - 36 seconds  Protime-INR     Status: Abnormal   Collection Time: 10/29/18  2:43 PM  Result Value Ref Range   Prothrombin Time 17.0 (H) 11.4 - 15.2 seconds   INR 1.4 (H) 0.8 - 1.2   No results found for this or any previous visit (from the past 240 hour(s)).  Renal Function: Recent Labs    10/27/18 0750 10/29/18 1348 10/29/18 1443  CREATININE 0.90 1.59* 1.53*   Estimated Creatinine Clearance: 52.6 mL/min (A) (by C-G formula based on SCr of 1.53 mg/dL (H)).  Radiologic Imaging: Dg Chest Port 1 View  Result Date: 10/29/2018 CLINICAL DATA:  Pt states diagnosed with renal stones Friday, states pain has gotten worse and she has been febrile at home, unsure how high. C/o L flank and abdominal pain. Pt denies vomiting but endorses nausea. Denies blood in urine. Last pai.*comment was truncated*Sepsis EXAM: PORTABLE CHEST 1 VIEW COMPARISON:  None. FINDINGS: Normal cardiac  silhouette. Mild central venous congestion. No focal infiltrate. No pneumothorax. IMPRESSION: Mild central venous congestion. Electronically Signed   By: Suzy Bouchard M.D.   On: 10/29/2018 15:41   Ct Renal Stone Study  Result Date: 10/29/2018 CLINICAL DATA:  LEFT flank pain abdominal pain.  Nausea EXAM: CT ABDOMEN AND PELVIS WITHOUT CONTRAST TECHNIQUE: Multidetector CT imaging of the abdomen and pelvis was performed following the standard protocol without IV contrast. COMPARISON:  10/27/2018 FINDINGS: Lower chest: Lung bases clear Hepatobiliary: No focal hepatic lesion.  Gallbladder normal. Pancreas: Pancreas normal Spleen: Normal spleen Adrenals/Urinary Tract: Adrenal glands are normal. Mild renal edema, hydronephrosis, and hydroureter of the LEFT kidney again noted. The distal LEFT ureteral calculus has migrated approximately 1 cm compared to exam 10/27/2018. Calculus position at the level the LEFT operator space measuring 4 mm by 6 mm (image 77/3). Calculus is approximately 2 cm from the vesicoureteral junction. No nephrolithiasis.  No RIGHT ureterolithiasis.  No bladder calculi Stomach/Bowel: Stomach, small-bowel, appendix and cecum normal. The colon rectosigmoid colon normal. Vascular/Lymphatic: Abdominal aorta is normal caliber. There is no retroperitoneal or periportal lymphadenopathy. No pelvic lymphadenopathy. Reproductive: Post hysterectomy. Cystic enlargement of the RIGHT ovary to 4.2 x 3.8 cm again noted. Cystic component measures approximately 3.1 cm. Other: No free fluid Musculoskeletal: No aggressive osseous lesion IMPRESSION: 1. Partially obstructing calculus in the distal LEFT ureter has migrated approximately 1 cm towards the vesicoureteral junction. 2. Cystic lesion of the RIGHT ovary. No follow-up necessary in this age group. This recommendation follows ACR consensus guidelines: White Paper of the ACR Incidental Findings Committee II on Adnexal Findings. J Am Coll Radiol [phone].  Electronically Signed   By: Suzy Bouchard M.D.   On: 10/29/2018 15:37    I independently reviewed the above imaging studies.  Impression/Recommendation: Left ureteral stone and sepsis: She needs urgent decompression of the left renal unit.  Due to hemodynamic instability, she is not stable for OR. I have discussed with Dr. Kathlene Cote with IR and requested left nephrostomy placement.  I would recommend hospitalist or CCM admission to ICU for continued resuscitation and broad spectrum antibiotics pending culture results.  Dutch Gray 10/29/2018, 4:24 PM  Pryor Curia. MD   CC: Dr. Darl Householder

## 2018-10-30 ENCOUNTER — Inpatient Hospital Stay (HOSPITAL_COMMUNITY): Payer: Managed Care, Other (non HMO)

## 2018-10-30 ENCOUNTER — Encounter (HOSPITAL_COMMUNITY): Payer: Self-pay

## 2018-10-30 DIAGNOSIS — R7881 Bacteremia: Secondary | ICD-10-CM

## 2018-10-30 DIAGNOSIS — N12 Tubulo-interstitial nephritis, not specified as acute or chronic: Secondary | ICD-10-CM

## 2018-10-30 DIAGNOSIS — R6521 Severe sepsis with septic shock: Secondary | ICD-10-CM

## 2018-10-30 DIAGNOSIS — B961 Klebsiella pneumoniae [K. pneumoniae] as the cause of diseases classified elsewhere: Secondary | ICD-10-CM

## 2018-10-30 DIAGNOSIS — N170 Acute kidney failure with tubular necrosis: Secondary | ICD-10-CM

## 2018-10-30 DIAGNOSIS — R7989 Other specified abnormal findings of blood chemistry: Secondary | ICD-10-CM

## 2018-10-30 LAB — CBC
HCT: 32.8 % — ABNORMAL LOW (ref 36.0–46.0)
HCT: 34.4 % — ABNORMAL LOW (ref 36.0–46.0)
HCT: 34.7 % — ABNORMAL LOW (ref 36.0–46.0)
HCT: 35.1 % — ABNORMAL LOW (ref 36.0–46.0)
Hemoglobin: 11.4 g/dL — ABNORMAL LOW (ref 12.0–15.0)
Hemoglobin: 11.6 g/dL — ABNORMAL LOW (ref 12.0–15.0)
Hemoglobin: 11.9 g/dL — ABNORMAL LOW (ref 12.0–15.0)
Hemoglobin: 12.1 g/dL (ref 12.0–15.0)
MCH: 30.2 pg (ref 26.0–34.0)
MCH: 30.3 pg (ref 26.0–34.0)
MCH: 30.4 pg (ref 26.0–34.0)
MCH: 30.7 pg (ref 26.0–34.0)
MCHC: 33 g/dL (ref 30.0–36.0)
MCHC: 34.6 g/dL (ref 30.0–36.0)
MCHC: 34.8 g/dL (ref 30.0–36.0)
MCHC: 34.9 g/dL (ref 30.0–36.0)
MCV: 87.2 fL (ref 80.0–100.0)
MCV: 87.3 fL (ref 80.0–100.0)
MCV: 88.1 fL (ref 80.0–100.0)
MCV: 92.1 fL (ref 80.0–100.0)
Platelets: 33 10*3/uL — ABNORMAL LOW (ref 150–400)
Platelets: 36 10*3/uL — ABNORMAL LOW (ref 150–400)
Platelets: 38 10*3/uL — ABNORMAL LOW (ref 150–400)
Platelets: 41 10*3/uL — ABNORMAL LOW (ref 150–400)
RBC: 3.76 MIL/uL — ABNORMAL LOW (ref 3.87–5.11)
RBC: 3.81 MIL/uL — ABNORMAL LOW (ref 3.87–5.11)
RBC: 3.94 MIL/uL (ref 3.87–5.11)
RBC: 3.94 MIL/uL (ref 3.87–5.11)
RDW: 14.1 % (ref 11.5–15.5)
RDW: 14.2 % (ref 11.5–15.5)
RDW: 14.4 % (ref 11.5–15.5)
RDW: 14.4 % (ref 11.5–15.5)
WBC: 10 10*3/uL (ref 4.0–10.5)
WBC: 6 10*3/uL (ref 4.0–10.5)
WBC: 8 10*3/uL (ref 4.0–10.5)
WBC: 9 10*3/uL (ref 4.0–10.5)
nRBC: 0 % (ref 0.0–0.2)
nRBC: 0 % (ref 0.0–0.2)
nRBC: 0 % (ref 0.0–0.2)
nRBC: 0 % (ref 0.0–0.2)

## 2018-10-30 LAB — BLOOD CULTURE ID PANEL (REFLEXED)

## 2018-10-30 LAB — COMPREHENSIVE METABOLIC PANEL
ALT: 18 U/L (ref 0–44)
AST: 26 U/L (ref 15–41)
Albumin: 2.1 g/dL — ABNORMAL LOW (ref 3.5–5.0)
Alkaline Phosphatase: 117 U/L (ref 38–126)
Anion gap: 10 (ref 5–15)
BUN: 18 mg/dL (ref 6–20)
CO2: 19 mmol/L — ABNORMAL LOW (ref 22–32)
Calcium: 7.1 mg/dL — ABNORMAL LOW (ref 8.9–10.3)
Chloride: 108 mmol/L (ref 98–111)
Creatinine, Ser: 1.56 mg/dL — ABNORMAL HIGH (ref 0.44–1.00)
GFR calc Af Amer: 46 mL/min — ABNORMAL LOW (ref >60)
GFR calc non Af Amer: 40 mL/min — ABNORMAL LOW (ref >60)
Glucose, Bld: 107 mg/dL — ABNORMAL HIGH (ref 70–99)
Potassium: 3.1 mmol/L — ABNORMAL LOW (ref 3.5–5.1)
Sodium: 137 mmol/L (ref 135–145)
Total Bilirubin: 1.8 mg/dL — ABNORMAL HIGH (ref 0.3–1.2)
Total Protein: 5.2 g/dL — ABNORMAL LOW (ref 6.5–8.1)

## 2018-10-30 LAB — HIV ANTIBODY (ROUTINE TESTING W REFLEX): HIV Screen 4th Generation wRfx: NONREACTIVE

## 2018-10-30 LAB — MAGNESIUM
Magnesium: 1.3 mg/dL — ABNORMAL LOW (ref 1.7–2.4)
Magnesium: 2 mg/dL (ref 1.7–2.4)

## 2018-10-30 LAB — BASIC METABOLIC PANEL
Anion gap: 6 (ref 5–15)
BUN: 14 mg/dL (ref 6–20)
CO2: 20 mmol/L — ABNORMAL LOW (ref 22–32)
Calcium: 7.4 mg/dL — ABNORMAL LOW (ref 8.9–10.3)
Chloride: 110 mmol/L (ref 98–111)
Creatinine, Ser: 1.18 mg/dL — ABNORMAL HIGH (ref 0.44–1.00)
GFR calc Af Amer: >60 mL/min (ref >60)
GFR calc non Af Amer: 56 mL/min — ABNORMAL LOW (ref >60)
Glucose, Bld: 109 mg/dL — ABNORMAL HIGH (ref 70–99)
Potassium: 4.6 mmol/L (ref 3.5–5.1)
Sodium: 136 mmol/L (ref 135–145)

## 2018-10-30 LAB — IMMATURE PLATELET FRACTION: Immature Platelet Fraction: 8.4 % (ref 1.2–8.6)

## 2018-10-30 LAB — TROPONIN I (HIGH SENSITIVITY): Troponin I (High Sensitivity): 38 ng/L — ABNORMAL HIGH (ref <18)

## 2018-10-30 LAB — GLUCOSE, CAPILLARY: Glucose-Capillary: 85 mg/dL (ref 70–99)

## 2018-10-30 LAB — AMYLASE: Amylase: 36 U/L (ref 28–100)

## 2018-10-30 LAB — LACTIC ACID, PLASMA: Lactic Acid, Venous: 2.5 mmol/L (ref 0.5–1.9)

## 2018-10-30 LAB — LIPASE, BLOOD: Lipase: 16 U/L (ref 11–51)

## 2018-10-30 LAB — PHOSPHORUS: Phosphorus: 2 mg/dL — ABNORMAL LOW (ref 2.5–4.6)

## 2018-10-30 LAB — SAVE SMEAR(SSMR), FOR PROVIDER SLIDE REVIEW

## 2018-10-30 LAB — MRSA PCR SCREENING: MRSA by PCR: NEGATIVE

## 2018-10-30 MED ORDER — SODIUM CHLORIDE 0.9% FLUSH
3.0000 mL | INTRAVENOUS | Status: DC | PRN
  Administered 2018-11-05: 3 mL via INTRAVENOUS
  Filled 2018-10-30: qty 3

## 2018-10-30 MED ORDER — SODIUM CHLORIDE 0.9% FLUSH
3.0000 mL | Freq: Two times a day (BID) | INTRAVENOUS | Status: DC
  Administered 2018-10-30 – 2018-11-06 (×10): 3 mL via INTRAVENOUS

## 2018-10-30 MED ORDER — SODIUM CHLORIDE 0.9% FLUSH
5.0000 mL | Freq: Three times a day (TID) | INTRAVENOUS | Status: DC
  Administered 2018-10-30 – 2018-11-02 (×9): 5 mL

## 2018-10-30 MED ORDER — MORPHINE SULFATE (PF) 2 MG/ML IV SOLN
2.0000 mg | INTRAVENOUS | Status: DC | PRN
  Administered 2018-10-30 – 2018-10-31 (×2): 2 mg via INTRAVENOUS
  Administered 2018-10-31: 09:00:00 4 mg via INTRAVENOUS
  Administered 2018-10-31: 2 mg via INTRAVENOUS
  Administered 2018-11-01: 4 mg via INTRAVENOUS
  Administered 2018-11-01 – 2018-11-03 (×5): 2 mg via INTRAVENOUS
  Filled 2018-10-30 (×3): qty 1
  Filled 2018-10-30: qty 2
  Filled 2018-10-30: qty 1
  Filled 2018-10-30: qty 2
  Filled 2018-10-30 (×4): qty 1
  Filled 2018-10-30: qty 2

## 2018-10-30 MED ORDER — ORAL CARE MOUTH RINSE
15.0000 mL | Freq: Two times a day (BID) | OROMUCOSAL | Status: DC
  Administered 2018-10-30 – 2018-11-09 (×16): 15 mL via OROMUCOSAL

## 2018-10-30 MED ORDER — SODIUM CHLORIDE 0.9 % IV SOLN: 250.0000 mL | INTRAVENOUS | Status: DC | PRN

## 2018-10-30 MED ORDER — MAGNESIUM SULFATE 2 GM/50ML IV SOLN
2.0000 g | Freq: Once | INTRAVENOUS | Status: AC
  Administered 2018-10-30: 2 g via INTRAVENOUS
  Filled 2018-10-30: qty 50

## 2018-10-30 MED ORDER — SODIUM CHLORIDE 0.9 % IV SOLN
2.0000 g | INTRAVENOUS | Status: DC
  Administered 2018-10-30 – 2018-10-31 (×2): 2 g via INTRAVENOUS
  Filled 2018-10-30 (×2): qty 20
  Filled 2018-10-30: qty 2

## 2018-10-30 MED ORDER — CHLORHEXIDINE GLUCONATE CLOTH 2 % EX PADS
6.0000 | MEDICATED_PAD | Freq: Every day | CUTANEOUS | Status: DC
  Administered 2018-10-30 – 2018-11-09 (×8): 6 via TOPICAL

## 2018-10-30 MED ORDER — SODIUM CHLORIDE 0.9 % IV SOLN
INTRAVENOUS | Status: DC
  Administered 2018-10-30 – 2018-11-06 (×13): via INTRAVENOUS

## 2018-10-30 NOTE — Progress Notes (Signed)
Attempted report x 2 

## 2018-10-30 NOTE — Progress Notes (Signed)
PHARMACY - PHYSICIAN COMMUNICATION CRITICAL VALUE ALERT - BLOOD CULTURE IDENTIFICATION (BCID)  Tamara Watson is an 48 y.o. female who presented to [facility] on 10/29/2018 with a chief complaint of urosepsis likely due to ureteral stone.  Assessment:   Nephrostomy tube placed by IR.  BCID showed 4 of 4 cultures with GNR, Klebsiella pneumoniae, no KPC resistance detected.  Name of physician (or Provider) Contacted: Dr. Genevive Bi  Current antibiotics:  Vancomycin and Cefepime  Changes to prescribed antibiotics recommended:    Discussed with Dr. Genevive Bi.  Change to Ceftriaxone 2gm IV q24hrs.  Results for orders placed or performed during the hospital encounter of 10/29/18  Blood Culture ID Panel (Reflexed) (Collected: 10/29/2018  3:23 PM)  Result Value Ref Range   Enterococcus species NOT DETECTED NOT DETECTED   Listeria monocytogenes NOT DETECTED NOT DETECTED   Staphylococcus species NOT DETECTED NOT DETECTED   Staphylococcus aureus (BCID) NOT DETECTED NOT DETECTED   Streptococcus species NOT DETECTED NOT DETECTED   Streptococcus agalactiae NOT DETECTED NOT DETECTED   Streptococcus pneumoniae NOT DETECTED NOT DETECTED   Streptococcus pyogenes NOT DETECTED NOT DETECTED   Acinetobacter baumannii NOT DETECTED NOT DETECTED   Enterobacteriaceae species DETECTED (A) NOT DETECTED   Enterobacter cloacae complex NOT DETECTED NOT DETECTED   Escherichia coli NOT DETECTED NOT DETECTED   Klebsiella oxytoca NOT DETECTED NOT DETECTED   Klebsiella pneumoniae DETECTED (A) NOT DETECTED   Proteus species NOT DETECTED NOT DETECTED   Serratia marcescens NOT DETECTED NOT DETECTED   Carbapenem resistance NOT DETECTED NOT DETECTED   Haemophilus influenzae NOT DETECTED NOT DETECTED   Neisseria meningitidis NOT DETECTED NOT DETECTED   Pseudomonas aeruginosa NOT DETECTED NOT DETECTED   Candida albicans NOT DETECTED NOT DETECTED   Candida glabrata NOT DETECTED NOT DETECTED   Candida krusei NOT  DETECTED NOT DETECTED   Candida parapsilosis NOT DETECTED NOT DETECTED   Candida tropicalis NOT DETECTED NOT DETECTED    Arty Baumgartner, RPh Pager: [phone] or phone: [phone] 10/30/2018  4:42 AM

## 2018-10-30 NOTE — ED Notes (Addendum)
Paged PCCM, received call back, reassured pt about bed placement per info from critical care MD. Pt agrees for transport.

## 2018-10-30 NOTE — Progress Notes (Signed)
Report given to [facility]N-[facility] RN. Pt transported to CT with transport. After CT pt transported with NT to [facility] 02. Belonging's in pt belonging bag, husband updated on pt's location. Pt stable, no complications.

## 2018-10-30 NOTE — Progress Notes (Signed)
Bilateral lower extremity venous duplex completed. Refer to "CV Proc" under chart review to view preliminary results.  10/30/2018 4:58 PM Maudry Mayhew, MHA, RVT, RDCS, RDMS

## 2018-10-30 NOTE — ED Notes (Signed)
RN got blood and blood cultures

## 2018-10-30 NOTE — Progress Notes (Signed)
Referring Physician(s): * No referring provider recorded for this case *  Supervising Physician: Corrie Mckusick  Patient Status:  [facility] - In-pt  Chief Complaint: Urosepsis  Subjective: Patient in exquisite pain.  L PCN placed yesterday.  Has small amount of bloody output.  Urinating.  Tachycardic.  Allergies: Patient has no known allergies.  Medications: Prior to Admission medications   Medication Sig Start Date End Date Taking? Authorizing Provider  acetaminophen (TYLENOL) 500 MG tablet Take 1,000 mg by mouth every 6 (six) hours as needed for headache.   Yes [provider]  dexlansoprazole (DEXILANT) 60 MG capsule TAKE 1 CAPSULE BY MOUTH DAILY BEFORE BREAKFAST. Patient taking differently: Take 60 mg by mouth daily before breakfast.  08/01/18  Yes Nandigam, Venia Minks, MD  fluticasone (FLONASE) 50 MCG/ACT nasal spray Place 2 sprays into both nostrils daily. Patient taking differently: Place 2 sprays into both nostrils daily as needed for allergies.  11/08/16  Yes Rogue Bussing, MD  HYDROcodone-acetaminophen (NORCO/VICODIN) 5-325 MG tablet Take 1 tablet by mouth every 6 (six) hours as needed for up to 3 days for severe pain. 10/27/18 10/30/18 Yes Madilyn Hook A, PA-C  ibuprofen (ADVIL) 200 MG tablet Take 600 mg by mouth every 6 (six) hours as needed for headache (pain).   Yes [provider]  ondansetron (ZOFRAN) 4 MG tablet Take 1 tablet (4 mg total) by mouth every 6 (six) hours. 10/27/18  Yes Alveria Apley, PA-C  oxyCODONE-acetaminophen (PERCOCET/ROXICET) 5-325 MG tablet Take 1-2 tablets by mouth every 6 (six) hours as needed for moderate pain (once the patient tolerates po and PCA has been discontinued). Patient taking differently: Take 1-2 tablets by mouth every 6 (six) hours as needed for moderate pain.  06/16/18  Yes Waymon Amato, MD  senna-docusate (SENOKOT-S) 8.6-50 MG tablet Take 2 tablets by mouth at bedtime as needed for mild constipation or moderate  constipation. 06/16/18  Yes Waymon Amato, MD  tamsulosin (FLOMAX) 0.4 MG CAPS capsule Take 1 capsule (0.4 mg total) by mouth daily. 10/27/18 11/26/18 Yes Madilyn Hook A, PA-C  vitamin C (ASCORBIC ACID) 500 MG tablet Take 500 mg by mouth daily as needed (immune system boost/ cold symptoms).    Yes [provider]  benzonatate (TESSALON) 100 MG capsule Take 1 capsule (100 mg total) by mouth 3 (three) times daily as needed for cough. Patient not taking: Reported on 10/29/2018 01/09/18    Bing, DO  docusate sodium (COLACE) 100 MG capsule Take 1 capsule (100 mg total) by mouth 2 (two) times daily. Patient not taking: Reported on 10/29/2018 06/16/18   Waymon Amato, MD  ibuprofen (ADVIL) 600 MG tablet Take 1 tablet (600 mg total) by mouth every 6 (six) hours. Patient not taking: Reported on 10/29/2018 06/19/18   Waymon Amato, MD  lidocaine (LIDODERM) 5 % Place 1 patch onto the skin daily. Remove & Discard patch within 12 hours or as directed by MD Patient not taking: Reported on 10/29/2018 06/16/18   Waymon Amato, MD  LINZESS 72 MCG capsule TAKE 1 CAPSULE (72 MCG TOTAL) BY MOUTH DAILY BEFORE BREAKFAST. Patient not taking: No sig reported 11/03/17   Mauri Pole, MD  pantoprazole (PROTONIX) 40 MG tablet Take 1 tablet (40 mg total) by mouth daily. Patient not taking: Reported on 10/29/2018 06/17/18   Waymon Amato, MD     Vital Signs: BP 100/69    Pulse (!) 107    Temp 99.9 F (37.7 C) (Axillary)    Resp  18    Ht 5' 5.5" (1.664 m)    Wt 203 lb (92.1 kg)    LMP 06/05/2018    SpO2 98%    BMI 33.27 kg/m   Physical Exam  NAD, alert Abdomen:  Exquisite tenderness to left flank.  Difficulty rolling due to pain.  Small amount of bloody output.  Cannot tolerate flush.   Imaging: Dg Chest Port 1 View  Result Date: 10/29/2018 CLINICAL DATA:  Tachycardia.  Left flank and abdominal pain. EXAM: PORTABLE CHEST 1 VIEW COMPARISON:  None. FINDINGS: The heart size and mediastinal contours are within normal  limits. Opacity in the left base is identified which may represent atelectasis or infiltrate. The visualized skeletal structures are unremarkable. IMPRESSION: Left base opacity which may represent atelectasis or infiltrate Electronically Signed   By: Kerby Moors M.D.   On: 10/29/2018 20:14   Dg Chest Port 1 View  Result Date: 10/29/2018 CLINICAL DATA:  Pt states diagnosed with renal stones Friday, states pain has gotten worse and she has been febrile at home, unsure how high. C/o L flank and abdominal pain. Pt denies vomiting but endorses nausea. Denies blood in urine. Last pai.*comment was truncated*Sepsis EXAM: PORTABLE CHEST 1 VIEW COMPARISON:  None. FINDINGS: Normal cardiac silhouette. Mild central venous congestion. No focal infiltrate. No pneumothorax. IMPRESSION: Mild central venous congestion. Electronically Signed   By: Suzy Bouchard M.D.   On: 10/29/2018 15:41   Ct Renal Stone Study  Result Date: 10/29/2018 CLINICAL DATA:  LEFT flank pain abdominal pain.  Nausea EXAM: CT ABDOMEN AND PELVIS WITHOUT CONTRAST TECHNIQUE: Multidetector CT imaging of the abdomen and pelvis was performed following the standard protocol without IV contrast. COMPARISON:  10/27/2018 FINDINGS: Lower chest: Lung bases clear Hepatobiliary: No focal hepatic lesion.  Gallbladder normal. Pancreas: Pancreas normal Spleen: Normal spleen Adrenals/Urinary Tract: Adrenal glands are normal. Mild renal edema, hydronephrosis, and hydroureter of the LEFT kidney again noted. The distal LEFT ureteral calculus has migrated approximately 1 cm compared to exam 10/27/2018. Calculus position at the level the LEFT operator space measuring 4 mm by 6 mm (image 77/3). Calculus is approximately 2 cm from the vesicoureteral junction. No nephrolithiasis.  No RIGHT ureterolithiasis.  No bladder calculi Stomach/Bowel: Stomach, small-bowel, appendix and cecum normal. The colon rectosigmoid colon normal. Vascular/Lymphatic: Abdominal aorta is normal  caliber. There is no retroperitoneal or periportal lymphadenopathy. No pelvic lymphadenopathy. Reproductive: Post hysterectomy. Cystic enlargement of the RIGHT ovary to 4.2 x 3.8 cm again noted. Cystic component measures approximately 3.1 cm. Other: No free fluid Musculoskeletal: No aggressive osseous lesion IMPRESSION: 1. Partially obstructing calculus in the distal LEFT ureter has migrated approximately 1 cm towards the vesicoureteral junction. 2. Cystic lesion of the RIGHT ovary. No follow-up necessary in this age group. This recommendation follows ACR consensus guidelines: White Paper of the ACR Incidental Findings Committee II on Adnexal Findings. J Am Coll Radiol [phone]. Electronically Signed   By: Suzy Bouchard M.D.   On: 10/29/2018 15:37   Ct Renal Stone Study  Result Date: 10/27/2018 CLINICAL DATA:  Left flank and lower abdominal pain with nausea and vomiting. EXAM: CT ABDOMEN AND PELVIS WITHOUT CONTRAST TECHNIQUE: Multidetector CT imaging of the abdomen and pelvis was performed following the standard protocol without IV contrast. COMPARISON:  None. FINDINGS: Lower chest: Mild dependent bibasilar atelectasis. No pleural or pericardial effusion. Hepatobiliary: No focal liver abnormality is seen. No gallstones, gallbladder wall thickening, or biliary dilatation. Pancreas: Unremarkable. No pancreatic ductal dilatation or surrounding inflammatory changes. Spleen: Normal  in size without focal abnormality. Adrenals/Urinary Tract: The adrenal glands appear normal. There is mild to moderate left hydronephrosis with stranding about the left kidney and ureter due to a 0.5 cm distal left ureteral stone. No other urinary tract stones are identified. The kidneys otherwise appear normal. Urinary bladder is decompressed but otherwise unremarkable. Stomach/Bowel: Stomach is within normal limits. Appendix appears normal. No evidence of bowel wall thickening, distention, or inflammatory changes.  Vascular/Lymphatic: No significant vascular findings are present. No enlarged abdominal or pelvic lymph nodes. Reproductive: Status post hysterectomy. No adnexal masses. Other: Small fat containing umbilical hernia noted. Stranding in subcutaneous fat anterior low pelvis is likely postoperative. Musculoskeletal: No acute or focal abnormality. IMPRESSION: Mild-to-moderate left hydronephrosis due to a 0.5 cm distal left ureteral stone. No other urinary tract stones are seen. Small fat containing umbilical hernia. Electronically Signed   By: Inge Rise M.D.   On: 10/27/2018 09:24   Ir Nephrostomy Placement Left  Result Date: 10/29/2018 CLINICAL DATA:  Obstructing left ureteral calculus, sepsis and need for emergent percutaneous nephrostomy tube placement. EXAM: 1. ULTRASOUND GUIDANCE FOR PUNCTURE OF THE LEFT RENAL COLLECTING SYSTEM. 2. LEFT PERCUTANEOUS NEPHROSTOMY TUBE PLACEMENT. COMPARISON:  CT of the abdomen and pelvis earlier today. ANESTHESIA/SEDATION: 2.0 mg IV Versed; 100 mcg IV Fentanyl. Total Moderate Sedation Time 22 minutes. The patient's level of consciousness and physiologic status were continuously monitored during the procedure by Radiology nursing. CONTRAST:  20 mL Omnipaque 300 MEDICATIONS: No additional medications. A scheduled dose of IV vancomycin was infusing during the procedure. FLUOROSCOPY TIME:  1 minutes and 36 seconds.  12.4 mGy. PROCEDURE: The procedure, risks, benefits, and alternatives were explained to the patient. Questions regarding the procedure were encouraged and answered. The patient understands and consents to the procedure. A time-out was performed prior to initiating the procedure. The left flank region was prepped with chlorhexidine in a sterile fashion, and a sterile drape was applied covering the operative field. A sterile gown and sterile gloves were used for the procedure. Local anesthesia was provided with 1% Lidocaine. Ultrasound was used to localize the left  kidney. Under direct ultrasound guidance, a 21 gauge needle was advanced into the renal collecting system. Ultrasound image documentation was performed. Aspiration of urine sample was performed followed by contrast injection. A transitional dilator was advanced over a guidewire. Percutaneous tract dilatation was then performed over the guidewire. A 10-French percutaneous nephrostomy tube was then advanced and formed in the collecting system. Catheter position was confirmed by fluoroscopy after contrast injection. The catheter was secured at the skin with a Prolene retention suture and Stat-Lock device. A gravity bag was placed. COMPLICATIONS: None. FINDINGS: Ultrasound demonstrates hydronephrosis. After puncture of the lower pole collecting system, there was return of clear but foul-smelling urine. 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 After nephrostomy tube placement, the tube could not be completely formed in the renal pelvis due to the duplicated nature of the collecting system and small size of the renal pelvis. There was some acute bleeding after nephrostomy tube placement with bloody urine return after tube placement. A urine sample was sent for culture analysis. IMPRESSION: Placement of 10 French left percutaneous nephrostomy tube. The tube was attached to gravity bag drainage. Initial urine output is bloody after tube placement. A urine sample was sent for culture analysis. Electronically Signed   By: Aletta Edouard M.D.   On: 10/29/2018 19:23    Labs:  CBC: Recent Labs    10/27/18 0750 10/29/18 1348 10/30/18  0017 10/30/18 1042  WBC 3.7* 7.9 6.0 8.0  HGB 14.3 15.1* 11.6* 12.1  HCT 44.2 43.8 35.1* 34.7*  PLT 221 70* 38* 33*    COAGS: Recent Labs    10/29/18 1443  INR 1.4*  APTT 29    BMP: Recent Labs    10/29/18 1348 10/29/18 1443 10/30/18 0017 10/30/18 1042  NA 134* 132* 137 136  K 3.5 3.1* 3.1* 4.6  CL 98 100 108 110  CO2 22 18* 19*  20*  GLUCOSE 112* 117* 107* 109*  BUN 16 18 18 14   CALCIUM 8.5* 7.9* 7.1* 7.4*  CREATININE 1.59* 1.53* 1.56* 1.18*  GFRNONAA 39* 41* 40* 56*  GFRAA 45* 47* 46* >60    LIVER FUNCTION TESTS: Recent Labs    05/24/18 1413 10/27/18 0750 10/29/18 1443 10/30/18 0017  BILITOT 0.3 0.8 1.2 1.8*  AST 18 18 25 26   ALT 13 15 20 18   ALKPHOS 63 49 71 117  PROT 7.9 7.2 6.1* 5.2*  ALBUMIN 3.8 3.8 2.8* 2.1*    Assessment and Plan: Urosepsis in the setting of nephrolithiasis s/p left PCN placement 9/27 by Dr. Kathlene Cote.  Patient with pain during visit.  Difficulty rolling due to pain. Has had several pain medications this afternoon without relief.  Bloody output from catheter.  RN/patient report pink hue to urine.  Only 75 mL out from PCN in the past 4 hrs.  Remains tachycardic. Discussed with Dr. Earleen Newport.  Obtain non-contrast CT Abdomen Pelvis to assess catheter position.  Hold flushes for now-- RN aware.   Electronically Signed: Docia Barrier, PA 10/30/2018, 4:25 PM   I spent a total of 15 Minutes at the the patient's bedside AND on the patient's hospital floor or unit, greater than 50% of which was counseling/coordinating care for urosepsis.

## 2018-10-30 NOTE — Progress Notes (Addendum)
NAME:  Tamara Watson, MRN:  [id number], DOB:  01-31-1971, LOS: 1 ADMISSION DATE:  10/29/2018, CONSULTATION DATE:  10/29/18  CHIEF COMPLAINT:  Septic shock  Brief History   This is a 48 yo with history of adenomysosis and fibroids presents with urosepsis  History of present illness   This is a 48 yo with history of fibroids sp hysterctomy, anemia, and GERD. Presented to the ED on Friday 9/25 for flank pain. Noted to have nephrolithiasis. Sent home with pain mediciaots and flomax. Returns with continued flank pain, nausea, fevers and worsening malais. Had Ct that showed continued hydroehoroisis on left and partially obstructing calculus in the distal left ureter that had migrated about 1 cm towards vesicoureteral junction. Patient is popsitive for fevers at home. Notes dififculty drinking and eating because of nausea. Notes no blood in urine or bowels. Bowel habits normal. Urinating still. No chest pain or SOB.   Past Medical History  Fibroids Reflux Kidney Stones  Significant Hospital Events   -Urology consulted recommended nephrostomy tube as patient unstable for OR -IR consulted and nephrostomy tube placed -NE briefly started in ED. PCCM admitted. Soon after, patient off NE.   Consults:  -Urology and IR  Procedures:  Nephrostomy tube  Significant Diagnostic Tests:  CT abdomen and pelvis on 9/27 noting  1. Partially obstructing calculus in the distal LEFT ureter has migrated approximately 1 cm towards the vesicoureteral junction. 2. Cystic lesion of the RIGHT ovary. No follow-up necessary in this age group. This recommendation follows ACR consensus guidelines: White Paper of the ACR Incidental Findings Committee II on Adnexal Findings. J Am Coll Radiol [phone].   Micro Data:  [place] 9/27- pending BCX 9/27-pending  Antimicrobials:  9/27> vanc>> 9/27> cefepime>>   Interim history/subjective:  Off pressors.  C/o flank pain.   Objective   Blood pressure  96/62, pulse (!) 120, temperature 99.5 F (37.5 C), temperature source Oral, resp. rate 19, height 5' 5.5" (1.664 m), weight 92.1 kg, last menstrual period 06/05/2018, SpO2 96 %.       No intake or output data in the 24 hours ending 10/30/18 0846 Filed Weights   10/29/18 1329  Weight: 92.1 kg    Examination: General: WDWN adult F, reclined in bed NAD  HENT: NCAT pink tacky mm. Trachea midline. Anicteric sclera Lungs: CTA bilaterally. Symmetrical chest expansion. No accessory muscle use  Cardiovascular: Tachycardic rate, regular rhythm. s1s2 no rgm  Abdomen: Soft, round, mild distension. Generalized abdominal tenderness. No rebound tenderness. + bowel sounds x4 Extremities: symmetrical bulk and tone, no obvious joint deformity. No cyanosis or clubbing  Neuro: AAOx4 following commands  GU: Nephrostomy tube with serosang output   Resolved Hospital Problem list   Shock  Assessment & Plan:  This is a 48 yo female with history as noted above who presents for worsening sepsis evolving into shock after IR placed nephrostomy tube for obstruction in the setting of known nephrolithiasis  Sepsis  - Pyelonephritis 2/2 partially obstructive ureteral calculus, s/p L nephrostomy tube -Nephrostomy placed by IR  -lactic acid is down-trending  -Klebsiella pneumoniae bacteremia P -Cont vanc/cefepime. De-escalate as able  -Follow up Ucx -Trend temp, WBC.  -Dilaudid for pain -When progressed further, urology consult.   AKI -Cr 1.53 from 0.9 -Likely obstructive. Possible pre-renal component in setting of hypovolemia P -Avoid nephrotoxic meds as able -Pharmacy to dose abx -Trend UOP -Trend renal function -Continue IVF as able   Thrombocytopenia -presenting plt 70 from 221 on 10/27/18.  -follow up  plt 38 from 70 -Likely in setting of sepsis  P -Trend CBC -Concern from overnight team RE HIT.  I do not think that this is HIT-- timing does not align well with heparin administration. -We  will dc heparin, send HIT panel for completeness -Monitor plt.  -add on immature plt fraction  -Transfuse plt as indicated by unit protocol   Acute blood loss anemia -from nephrostomy tube  -Hgb downtrend to 11.6 P -Trend CBC -Transfuse PRBC as indicated for Hgb > 7 -IR placed nephrostomy tube-- if increased bleeding, contact IR   Sinus Tachycardia -Likely combination hypovolemia and hypermetabolic response in setting of SIRS response -prior concern for Afib, appears regular at present. P -Continue IVF -continue tele -optimize K and Mag as below  Hypokalemia Hypomagnesemia P -K and Mag ordered for replacement overnight -Follow up K and Mag, replete as needed for goal K > 4, Mag > 2  Constipation, at risk P -bowel reg  -mobilize as able -advance PO as able   Best practice:  Diet: advance diet as tolerated  Pain/Anxiety/Delirium protocol (if indicated): Dilaudid prn VAP protocol (if indicated): NA DVT prophylaxis: SCD  GI prophylaxis: NA Glucose control: monitor  Mobility: as toelrated Code Status: Full Family Communication: Patient updated   Disposition: In ICU. Patient is HDS and stable on RA. Shock has improved. Anticipate transfer to SDU.   Labs   CBC: Recent Labs  Lab 10/27/18 0750 10/29/18 1348 10/30/18 0017  WBC 3.7* 7.9 6.0  NEUTROABS 1.3*  --   --   HGB 14.3 15.1* 11.6*  HCT 44.2 43.8 35.1*  MCV 90.8 88.1 92.1  PLT 221 70* 38*    Basic Metabolic Panel: Recent Labs  Lab 10/27/18 0750 10/29/18 1348 10/29/18 1443 10/30/18 0017  NA 139 134* 132* 137  K 3.6 3.5 3.1* 3.1*  CL 105 98 100 108  CO2 25 22 18* 19*  GLUCOSE 135* 112* 117* 107*  BUN 12 16 18 18   CREATININE 0.90 1.59* 1.53* 1.56*  CALCIUM 8.8* 8.5* 7.9* 7.1*  MG  --   --   --  1.3*  PHOS  --   --   --  2.0*   GFR: Estimated Creatinine Clearance: 51.6 mL/min (A) (by C-G formula based on SCr of 1.56 mg/dL (H)). Recent Labs  Lab 10/27/18 0750 10/29/18 1348 10/29/18 1726  10/29/18 2011 10/30/18 0017  WBC 3.7* 7.9  --   --  6.0  LATICACIDVEN  --  3.2* 2.5* 4.4* 2.5*    Liver Function Tests: Recent Labs  Lab 10/27/18 0750 10/29/18 1443 10/30/18 0017  AST 18 25 26   ALT 15 20 18   ALKPHOS 49 71 117  BILITOT 0.8 1.2 1.8*  PROT 7.2 6.1* 5.2*  ALBUMIN 3.8 2.8* 2.1*   Recent Labs  Lab 10/27/18 0750 10/30/18 0017  LIPASE 29 16  AMYLASE  --  36   No results for input(s): AMMONIA in the last 168 hours.  ABG No results found for: PHART, PCO2ART, PO2ART, HCO3, TCO2, ACIDBASEDEF, O2SAT   Coagulation Profile: Recent Labs  Lab 10/29/18 1443  INR 1.4*    Cardiac Enzymes: No results for input(s): CKTOTAL, CKMB, CKMBINDEX, TROPONINI in the last 168 hours.  HbA1C: No results found for: HGBA1C  CBG: Recent Labs  Lab 10/30/18 [place] MSN, AGACNP-BC [place] [id number] If no answer, [id number] 10/30/2018, 9:28 AM   PCCM attending:  48 year old admitted to the intensive care unit for  obstructing kidney stone with hydronephrosis of the left kidney, septic shock, Klebsiella bacteremia status post percutaneous nephrostomy tube placement by interventional radiology.  Patient doing well this morning in the ICU.  Hemodynamically stable awake alert following commands.  Does complain of pain at the site of tube insertion.  BP 104/73   Pulse (!) 122   Temp 99.5 F (37.5 C) (Oral)   Resp 15   Ht 5' 5.5" (1.664 m)   Wt 92.1 kg   LMP 06/05/2018   SpO2 97%   BMI 33.27 kg/m   General: 48 year old female resting in bed no apparent distress Heart: Regular rhythm S1-S2 no MRG Lungs: Bilateral clear breath sounds no crackles no wheeze Abdomen: Soft nontender nondistended bowel sounds present. Extremities: No significant edema.  Labs: Reviewed lactic acidosis 4.4 now down to 2.5 improving Elevated serum creatinine 1.56 Potassium 3.1 Chest x-ray: Left basilar opacity likely atelectasis   Assessment: Septic shock, pyelonephritis, obstructed ureteral calculus status post left sided nephrostomy tube Gram-negative bacteremia, Klebsiella pneumonia bacteremia, sepsis resolving Lactic acidosis AKI likely secondary to above, hypovolemia and obstruction Thrombocytopenia secondary to above, evaluating currently for HIT that was started overnight however less consistent due to timing. Hypokalemia Hypophosphatemia  Plan: Continue ceftriaxone Nephrostomy tube management per IR/urology Will likely need some intervention for stone at some point.  We appreciate their input. Patient is hemodynamically stable now. We will continue to observe in the ICU and can trans-position out of the unit likely later this afternoon. We have spoke with the family practice service who is agreed to take the patient onto their service starting tomorrow. Repeat potassium Repeat phosphorus Recheck BMP later this afternoon.  Garner Nash, DO [facility] 10/30/2018 10:30 AM  Personal pager: [phone] If unanswered, please page CCM On-call: [phone]

## 2018-10-30 NOTE — ED Notes (Signed)
Paged admitting per RN  

## 2018-10-30 NOTE — ED Notes (Signed)
Pt refusing to go to [facility], "they said I would go to [facility]. I want to talk to someone"

## 2018-10-30 NOTE — Progress Notes (Signed)
eLink Physician-Brief Progress Note Patient Name: Tamara Watson DOB: 03/05/1970 MRN: [id number]   Date of Service  10/30/2018  HPI/Events of Note  Patient complained of flank pain. Reportedly Norco did not work and Dilaudid was too strong  eICU Interventions  Ordered morphine 2-4 mg prn for severe pain. Patient seen currently asleep.     Intervention Category Intermediate Interventions: Pain - evaluation and management  Judd Lien 10/30/2018, 8:52 PM

## 2018-10-31 LAB — URINE CULTURE
Culture: 50000 — AB
Culture: >=100000 — AB

## 2018-10-31 LAB — CBC
HCT: 36.5 % (ref 36.0–46.0)
Hemoglobin: 12.1 g/dL (ref 12.0–15.0)
MCH: 29.3 pg (ref 26.0–34.0)
MCHC: 33.2 g/dL (ref 30.0–36.0)
MCV: 88.4 fL (ref 80.0–100.0)
Platelets: 53 10*3/uL — ABNORMAL LOW (ref 150–400)
RBC: 4.13 MIL/uL (ref 3.87–5.11)
RDW: 14.3 % (ref 11.5–15.5)
WBC: 10.1 10*3/uL (ref 4.0–10.5)
nRBC: 0 % (ref 0.0–0.2)

## 2018-10-31 LAB — BASIC METABOLIC PANEL
Anion gap: 6 (ref 5–15)
BUN: 11 mg/dL (ref 6–20)
CO2: 20 mmol/L — ABNORMAL LOW (ref 22–32)
Calcium: 7.9 mg/dL — ABNORMAL LOW (ref 8.9–10.3)
Chloride: 110 mmol/L (ref 98–111)
Creatinine, Ser: 1 mg/dL (ref 0.44–1.00)
GFR calc Af Amer: >60 mL/min (ref >60)
GFR calc non Af Amer: >60 mL/min (ref >60)
Glucose, Bld: 111 mg/dL — ABNORMAL HIGH (ref 70–99)
Potassium: 3.8 mmol/L (ref 3.5–5.1)
Sodium: 136 mmol/L (ref 135–145)

## 2018-10-31 LAB — PHOSPHORUS: Phosphorus: 1.6 mg/dL — ABNORMAL LOW (ref 2.5–4.6)

## 2018-10-31 LAB — HEPARIN INDUCED PLATELET AB (HIT ANTIBODY): Heparin Induced Plt Ab: 0.079 OD (ref 0.000–0.400)

## 2018-10-31 MED ORDER — KETOROLAC TROMETHAMINE 30 MG/ML IJ SOLN
30.0000 mg | Freq: Once | INTRAMUSCULAR | Status: AC
  Administered 2018-10-31: 30 mg via INTRAVENOUS
  Filled 2018-10-31: qty 1

## 2018-10-31 MED ORDER — POLYETHYLENE GLYCOL 3350 17 G PO PACK
17.0000 g | PACK | Freq: Every day | ORAL | Status: DC
  Filled 2018-10-31 (×6): qty 1

## 2018-10-31 MED ORDER — SODIUM PHOSPHATES 45 MMOLE/15ML IV SOLN
20.0000 mmol | Freq: Once | INTRAVENOUS | Status: AC
  Administered 2018-10-31: 20 mmol via INTRAVENOUS
  Filled 2018-10-31: qty 6.67

## 2018-10-31 MED ORDER — CEFAZOLIN SODIUM-DEXTROSE 2-4 GM/100ML-% IV SOLN
2.0000 g | Freq: Three times a day (TID) | INTRAVENOUS | Status: DC
  Administered 2018-10-31 – 2018-11-02 (×6): 2 g via INTRAVENOUS
  Filled 2018-10-31 (×7): qty 100

## 2018-10-31 MED ORDER — ONDANSETRON HCL 4 MG PO TABS
4.0000 mg | ORAL_TABLET | Freq: Four times a day (QID) | ORAL | Status: DC | PRN
  Administered 2018-10-31: 4 mg via ORAL
  Filled 2018-10-31 (×2): qty 1

## 2018-10-31 NOTE — Progress Notes (Signed)
Referring Physician(s): Dr Mosie Lukes  Supervising Physician: Aletta Edouard  Patient Status:  [facility] - In-pt  Chief Complaint:  Urosepsis L PCN placed 9/27  Subjective:  Obstructing left ureteral calculus L PCN placed 9/27 Cr and Wbc normalizing OP blood tinged urine 1.8 L yesterday  Still complains of pain with flush CT yesterday: IMPRESSION: 1. Percutaneous left nephrostomy tube with pigtail in the renal pelvis. No evidence of complication. Decreased hydronephrosis from prior exam. Unchanged obstructing 5 mm stone in the distal left ureter. 2. Left pleural effusion with adjacent atelectasis, minimally increased from prior. Trace right pleural effusion is new. 3. Mild hepatic steatosis and hepatomegaly. 4. Unchanged 3 cm right ovarian cyst.    Allergies: Patient has no known allergies.  Medications: Prior to Admission medications   Medication Sig Start Date End Date Taking? Authorizing Provider  acetaminophen (TYLENOL) 500 MG tablet Take 1,000 mg by mouth every 6 (six) hours as needed for headache.   Yes [provider]  dexlansoprazole (DEXILANT) 60 MG capsule TAKE 1 CAPSULE BY MOUTH DAILY BEFORE BREAKFAST. Patient taking differently: Take 60 mg by mouth daily before breakfast.  08/01/18  Yes Nandigam, Venia Minks, MD  fluticasone (FLONASE) 50 MCG/ACT nasal spray Place 2 sprays into both nostrils daily. Patient taking differently: Place 2 sprays into both nostrils daily as needed for allergies.  11/08/16  Yes Rogue Bussing, MD  ibuprofen (ADVIL) 200 MG tablet Take 600 mg by mouth every 6 (six) hours as needed for headache (pain).   Yes [provider]  ondansetron (ZOFRAN) 4 MG tablet Take 1 tablet (4 mg total) by mouth every 6 (six) hours. 10/27/18  Yes Alveria Apley, PA-C  oxyCODONE-acetaminophen (PERCOCET/ROXICET) 5-325 MG tablet Take 1-2 tablets by mouth every 6 (six) hours as needed for moderate pain (once the patient tolerates po and  PCA has been discontinued). Patient taking differently: Take 1-2 tablets by mouth every 6 (six) hours as needed for moderate pain.  06/16/18  Yes Waymon Amato, MD  senna-docusate (SENOKOT-S) 8.6-50 MG tablet Take 2 tablets by mouth at bedtime as needed for mild constipation or moderate constipation. 06/16/18  Yes Waymon Amato, MD  tamsulosin (FLOMAX) 0.4 MG CAPS capsule Take 1 capsule (0.4 mg total) by mouth daily. 10/27/18 11/26/18 Yes Madilyn Hook A, PA-C  vitamin C (ASCORBIC ACID) 500 MG tablet Take 500 mg by mouth daily as needed (immune system boost/ cold symptoms).    Yes [provider]  benzonatate (TESSALON) 100 MG capsule Take 1 capsule (100 mg total) by mouth 3 (three) times daily as needed for cough. Patient not taking: Reported on 10/29/2018 01/09/18   [place] Bing, DO  docusate sodium (COLACE) 100 MG capsule Take 1 capsule (100 mg total) by mouth 2 (two) times daily. Patient not taking: Reported on 10/29/2018 06/16/18   Waymon Amato, MD  ibuprofen (ADVIL) 600 MG tablet Take 1 tablet (600 mg total) by mouth every 6 (six) hours. Patient not taking: Reported on 10/29/2018 06/19/18   Waymon Amato, MD  lidocaine (LIDODERM) 5 % Place 1 patch onto the skin daily. Remove & Discard patch within 12 hours or as directed by MD Patient not taking: Reported on 10/29/2018 06/16/18   Waymon Amato, MD  LINZESS 72 MCG capsule TAKE 1 CAPSULE (72 MCG TOTAL) BY MOUTH DAILY BEFORE BREAKFAST. Patient not taking: No sig reported 11/03/17   Mauri Pole, MD  pantoprazole (PROTONIX) 40 MG tablet Take 1 tablet (40 mg total) by mouth daily.  Patient not taking: Reported on 10/29/2018 06/17/18   Waymon Amato, MD     Vital Signs: BP 133/72    Pulse (!) 107    Temp 100.1 F (37.8 C) (Oral)    Resp (!) 26    Ht 5' 5.5" (1.664 m)    Wt 223 lb 5.2 oz (101.3 kg)    LMP 06/05/2018    SpO2 100%    BMI 36.60 kg/m   Physical Exam Vitals signs reviewed.  Skin:    General: Skin is warm and dry.     Comments: Site is  clean and dry NT No bleeding OP 1.8 L yesterday OP in bag is blood tinged 20 cc in bag Flush causes pain to pt But flushes easily  Neurological:     Mental Status: She is alert.     Imaging: Ct Abdomen Pelvis Wo Contrast  Result Date: 10/30/2018 CLINICAL DATA:  Flank pain. Pain after left percutaneous nephrostomy tube placement 1 flushing catheter. EXAM: CT ABDOMEN AND PELVIS WITHOUT CONTRAST TECHNIQUE: Multidetector CT imaging of the abdomen and pelvis was performed following the standard protocol without IV contrast. COMPARISON:  CT yesterday. FINDINGS: Lower chest: Left pleural effusion with adjacent atelectasis, minimally increased from prior. Trace right pleural effusion is new. Hepatobiliary: No focal hepatic abnormality on noncontrast exam. Mild steatosis. Prominent size liver spanning 22 cm cranial caudal. Gallbladder physiologically distended, no calcified stone. No biliary dilatation. Pancreas: No ductal dilatation or inflammation. Spleen: Normal in size without focal abnormality. Adrenals/Urinary Tract: Normal adrenal glands. Percutaneous left nephrostomy tube with pigtail in the renal pelvis. No fluid collection or abnormality along the course of the tubing. Persistent but slight decreased left hydronephrosis from prior exam. Persistent left hydroureter and periureteric stranding with obstructing 5 mm stone in the distal ureter. The stone is not significantly changed in position from prior exam. No evidence of perinephric or intra nephric fluid collection. No right hydronephrosis. No right urolithiasis. Right ureter is decompressed. Urinary bladder is minimally distended. No bladder stone. Stomach/Bowel: Stomach is within normal limits. Appendix appears normal. No evidence of bowel wall thickening, distention, or inflammatory changes. Vascular/Lymphatic: Abdominal aorta is normal in caliber. No bulky abdominopelvic adenopathy. Reproductive: Unchanged from prior. Similar 3 cm right ovarian  cyst. Prior hysterectomy. Other: Trace free fluid in the pelvis. No free air. No evidence of intra-abdominal abscess. Musculoskeletal: There are no acute or suspicious osseous abnormalities. IMPRESSION: 1. Percutaneous left nephrostomy tube with pigtail in the renal pelvis. No evidence of complication. Decreased hydronephrosis from prior exam. Unchanged obstructing 5 mm stone in the distal left ureter. 2. Left pleural effusion with adjacent atelectasis, minimally increased from prior. Trace right pleural effusion is new. 3. Mild hepatic steatosis and hepatomegaly. 4. Unchanged 3 cm right ovarian cyst. Electronically Signed   By: Keith Rake M.D.   On: 10/30/2018 23:39   Dg Chest Port 1 View  Result Date: 10/29/2018 CLINICAL DATA:  Tachycardia.  Left flank and abdominal pain. EXAM: PORTABLE CHEST 1 VIEW COMPARISON:  None. FINDINGS: The heart size and mediastinal contours are within normal limits. Opacity in the left base is identified which may represent atelectasis or infiltrate. The visualized skeletal structures are unremarkable. IMPRESSION: Left base opacity which may represent atelectasis or infiltrate Electronically Signed   By: Kerby Moors M.D.   On: 10/29/2018 20:14   Dg Chest Port 1 View  Result Date: 10/29/2018 CLINICAL DATA:  Pt states diagnosed with renal stones Friday, states pain has gotten worse and she has been  febrile at home, unsure how high. C/o L flank and abdominal pain. Pt denies vomiting but endorses nausea. Denies blood in urine. Last pai.*comment was truncated*Sepsis EXAM: PORTABLE CHEST 1 VIEW COMPARISON:  None. FINDINGS: Normal cardiac silhouette. Mild central venous congestion. No focal infiltrate. No pneumothorax. IMPRESSION: Mild central venous congestion. Electronically Signed   By: Suzy Bouchard M.D.   On: 10/29/2018 15:41   Ct Renal Stone Study  Result Date: 10/29/2018 CLINICAL DATA:  LEFT flank pain abdominal pain.  Nausea EXAM: CT ABDOMEN AND PELVIS WITHOUT  CONTRAST TECHNIQUE: Multidetector CT imaging of the abdomen and pelvis was performed following the standard protocol without IV contrast. COMPARISON:  10/27/2018 FINDINGS: Lower chest: Lung bases clear Hepatobiliary: No focal hepatic lesion.  Gallbladder normal. Pancreas: Pancreas normal Spleen: Normal spleen Adrenals/Urinary Tract: Adrenal glands are normal. Mild renal edema, hydronephrosis, and hydroureter of the LEFT kidney again noted. The distal LEFT ureteral calculus has migrated approximately 1 cm compared to exam 10/27/2018. Calculus position at the level the LEFT operator space measuring 4 mm by 6 mm (image 77/3). Calculus is approximately 2 cm from the vesicoureteral junction. No nephrolithiasis.  No RIGHT ureterolithiasis.  No bladder calculi Stomach/Bowel: Stomach, small-bowel, appendix and cecum normal. The colon rectosigmoid colon normal. Vascular/Lymphatic: Abdominal aorta is normal caliber. There is no retroperitoneal or periportal lymphadenopathy. No pelvic lymphadenopathy. Reproductive: Post hysterectomy. Cystic enlargement of the RIGHT ovary to 4.2 x 3.8 cm again noted. Cystic component measures approximately 3.1 cm. Other: No free fluid Musculoskeletal: No aggressive osseous lesion IMPRESSION: 1. Partially obstructing calculus in the distal LEFT ureter has migrated approximately 1 cm towards the vesicoureteral junction. 2. Cystic lesion of the RIGHT ovary. No follow-up necessary in this age group. This recommendation follows ACR consensus guidelines: White Paper of the ACR Incidental Findings Committee II on Adnexal Findings. J Am Coll Radiol [phone]. Electronically Signed   By: Suzy Bouchard M.D.   On: 10/29/2018 15:37   Ir Nephrostomy Placement Left  Result Date: 10/29/2018 CLINICAL DATA:  Obstructing left ureteral calculus, sepsis and need for emergent percutaneous nephrostomy tube placement. EXAM: 1. ULTRASOUND GUIDANCE FOR PUNCTURE OF THE LEFT RENAL COLLECTING SYSTEM. 2. LEFT  PERCUTANEOUS NEPHROSTOMY TUBE PLACEMENT. COMPARISON:  CT of the abdomen and pelvis earlier today. ANESTHESIA/SEDATION: 2.0 mg IV Versed; 100 mcg IV Fentanyl. Total Moderate Sedation Time 22 minutes. The patient's level of consciousness and physiologic status were continuously monitored during the procedure by Radiology nursing. CONTRAST:  20 mL Omnipaque 300 MEDICATIONS: No additional medications. A scheduled dose of IV vancomycin was infusing during the procedure. FLUOROSCOPY TIME:  1 minutes and 36 seconds.  12.4 mGy. PROCEDURE: The procedure, risks, benefits, and alternatives were explained to the patient. Questions regarding the procedure were encouraged and answered. The patient understands and consents to the procedure. A time-out was performed prior to initiating the procedure. The left flank region was prepped with chlorhexidine in a sterile fashion, and a sterile drape was applied covering the operative field. A sterile gown and sterile gloves were used for the procedure. Local anesthesia was provided with 1% Lidocaine. Ultrasound was used to localize the left kidney. Under direct ultrasound guidance, a 21 gauge needle was advanced into the renal collecting system. Ultrasound image documentation was performed. Aspiration of urine sample was performed followed by contrast injection. A transitional dilator was advanced over a guidewire. Percutaneous tract dilatation was then performed over the guidewire. A 10-French percutaneous nephrostomy tube was then advanced and formed in the collecting system. Catheter position was  confirmed by fluoroscopy after contrast injection. The catheter was secured at the skin with a Prolene retention suture and Stat-Lock device. A gravity bag was placed. COMPLICATIONS: None. FINDINGS: Ultrasound demonstrates hydronephrosis. After puncture of the lower pole collecting system, there was return of clear but foul-smelling urine. 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 After nephrostomy tube placement, the tube could not be completely formed in the renal pelvis due to the duplicated nature of the collecting system and small size of the renal pelvis. There was some acute bleeding after nephrostomy tube placement with bloody urine return after tube placement. A urine sample was sent for culture analysis. IMPRESSION: Placement of 10 French left percutaneous nephrostomy tube. The tube was attached to gravity bag drainage. Initial urine output is bloody after tube placement. A urine sample was sent for culture analysis. Electronically Signed   By: Aletta Edouard M.D.   On: 10/29/2018 19:23   Vas [place] Lower Extremity Venous (dvt)  Result Date: 10/30/2018  Lower Venous Study Indications: Elevated d-dimer.  Limitations: Body habitus. Comparison Study: No prior study. Performing Technologist: Maudry Mayhew MHA, RDMS, RVT, RDCS  Examination Guidelines: A complete evaluation includes B-mode imaging, spectral Doppler, color Doppler, and power Doppler as needed of all accessible portions of each vessel. Bilateral testing is considered an integral part of a complete examination. Limited examinations for reoccurring indications may be performed as noted.  +---------+---------------+---------+-----------+----------+--------------+  RIGHT     Compressibility Phasicity Spontaneity Properties Thrombus Aging  +---------+---------------+---------+-----------+----------+--------------+  CFV       Full            Yes       Yes                                    +---------+---------------+---------+-----------+----------+--------------+  SFJ       Full                                                             +---------+---------------+---------+-----------+----------+--------------+  FV Prox   Full                                                             +---------+---------------+---------+-----------+----------+--------------+  FV Mid    Full                                                              +---------+---------------+---------+-----------+----------+--------------+  FV Distal Full                                                             +---------+---------------+---------+-----------+----------+--------------+  PFV  Full                                                             +---------+---------------+---------+-----------+----------+--------------+  POP       Full            Yes       Yes                                    +---------+---------------+---------+-----------+----------+--------------+  PTV       Full                                                             +---------+---------------+---------+-----------+----------+--------------+  PERO      Full                                                             +---------+---------------+---------+-----------+----------+--------------+   +---------+---------------+---------+-----------+----------+--------------+  LEFT      Compressibility Phasicity Spontaneity Properties Thrombus Aging  +---------+---------------+---------+-----------+----------+--------------+  CFV       Full            Yes       Yes                                    +---------+---------------+---------+-----------+----------+--------------+  SFJ       Full                                                             +---------+---------------+---------+-----------+----------+--------------+  FV Prox   Full                                                             +---------+---------------+---------+-----------+----------+--------------+  FV Mid    Full                                                             +---------+---------------+---------+-----------+----------+--------------+  FV Distal Full                                                             +---------+---------------+---------+-----------+----------+--------------+  PFV       Full                                                              +---------+---------------+---------+-----------+----------+--------------+  POP       Full            Yes       Yes                                    +---------+---------------+---------+-----------+----------+--------------+  PTV       Full                                                             +---------+---------------+---------+-----------+----------+--------------+  PERO      Full                                                             +---------+---------------+---------+-----------+----------+--------------+  Summary: Right: There is no evidence of deep vein thrombosis in the lower extremity. No cystic structure found in the popliteal fossa. Left: There is no evidence of deep vein thrombosis in the lower extremity. No cystic structure found in the popliteal fossa.  *See table(s) above for measurements and observations.    Preliminary     Labs:  CBC: Recent Labs    10/30/18 1042 10/30/18 1640 10/30/18 2254 10/31/18 0637  WBC 8.0 9.0 10.0 10.1  HGB 12.1 11.4* 11.9* 12.1  HCT 34.7* 32.8* 34.4* 36.5  PLT 33* 36* 41* 53*    COAGS: Recent Labs    10/29/18 1443  INR 1.4*  APTT 29    BMP: Recent Labs    10/29/18 1443 10/30/18 0017 10/30/18 1042 10/31/18 1023  NA 132* 137 136 136  K 3.1* 3.1* 4.6 3.8  CL 100 108 110 110  CO2 18* 19* 20* 20*  GLUCOSE 117* 107* 109* 111*  BUN 18 18 14 11   CALCIUM 7.9* 7.1* 7.4* 7.9*  CREATININE 1.53* 1.56* 1.18* 1.00  GFRNONAA 41* 40* 56* >60  GFRAA 47* 46* >60 >60    LIVER FUNCTION TESTS: Recent Labs    05/24/18 1413 10/27/18 0750 10/29/18 1443 10/30/18 0017  BILITOT 0.3 0.8 1.2 1.8*  AST 18 18 25 26   ALT 13 15 20 18   ALKPHOS 63 49 71 117  PROT 7.9 7.2 6.1* 5.2*  ALBUMIN 3.8 3.8 2.8* 2.1*    Assessment and Plan:  Left ureteral stone Urosepsis L PCN placed 9/27 Flushes easily-- great OP flush causes pain-- CT shows good position Will follow  Electronically Signed: Lavonia Drafts, PA-C 10/31/2018, 12:24  PM   I spent a total of 15 Minutes at the the patient's bedside AND on the patient's hospital floor or unit, greater than 50% of which was counseling/coordinating care for L PCN

## 2018-10-31 NOTE — Progress Notes (Signed)
PT Cancellation Note  Patient Details Name: Tamara Watson MRN: [id number] DOB: 21-Aug-1970   Cancelled Treatment:    Reason Eval/Treat Not Completed: Other (comment) Pt currently sleeping and husband requesting to let her sleep. Will follow up as schedule allows.   [name], PT, DPT  Acute Rehabilitation Services  Pager: [phone] Office: [phone]   [name] 10/31/2018, 11:43 AM

## 2018-10-31 NOTE — Progress Notes (Signed)
Pharmacy Antibiotic Note  Tamara Watson is a 48 y.o. female admitted on 10/29/2018 with bacteremia. SCr 1.53 on admission. Pharmacy has been consulted for cefazolin dosing.   Patient been ceftriaxone following 9/28 BCID which revealed 4 of 4 cultures with GNR, Klebsiella pneumoniae, no KPC resistance detected. Switching ceftriaxone to cefazolin. Afebrile and WBC WNL.   Plan: Discontinue ceftriaxone Start cefazolin 2 gram every 8 hours Monitor signs and symptoms of infection  Height: 5' 5.5" (166.4 cm) Weight: 223 lb 5.2 oz (101.3 kg) IBW/kg (Calculated) : 58.15  Temp (24hrs), Avg:99.6 F (37.6 C), Min:98.8 F (37.1 C), Max:100.1 F (37.8 C)  Recent Labs  Lab 10/27/18 0750 10/29/18 1348 10/29/18 1443 10/29/18 1726 10/29/18 2011 10/30/18 0017 10/30/18 1042 10/30/18 1640 10/30/18 2254 10/31/18 0637  WBC 3.7* 7.9  --   --   --  6.0 8.0 9.0 10.0 10.1  CREATININE 0.90 1.59* 1.53*  --   --  1.56* 1.18*  --   --   --   LATICACIDVEN  --  3.2*  --  2.5* 4.4* 2.5*  --   --   --   --     Estimated Creatinine Clearance: 71.7 mL/min (A) (by C-G formula based on SCr of 1.18 mg/dL (H)).    No Known Allergies  Antimicrobials this admission: Cefazolin 9/29 >> Rocephin 9/28 >> 9/29 Vancomycin 9/27 >> 9/28 Cefepime 9/27 x1  Dose adjustments this admission: Switching from ceftriaxone to cefazolin  Microbiology results: 9/27 BCID: klebsiella pneumoniae, enterobacteriacea 9/28 MRSA PCR: negative 9/27 Ucx: >100K klebsiella pneumoniae 9/27 COVID: negative    Thank you for allowing pharmacy to participate in this patient's care.  Paislei Dorval L. Devin Going, PharmD, [place] PGY1 Pharmacy Resident 10/31/18      11:17 AM  Please check AMION for all [place] phone numbers After 10:00 PM, call the [place] [phone]

## 2018-10-31 NOTE — Progress Notes (Signed)
PT Cancellation Note  Patient Details Name: WYNDY BEILMAN MRN: [id number] DOB: Feb 29, 1970   Cancelled Treatment:    Reason Eval/Treat Not Completed: Pain limiting ability to participate Checked on pt second time in afternoon and pt with increased pain and wanting to sleep. Will follow up as schedule allows.   [name], PT, DPT  Acute Rehabilitation Services  Pager: [phone] Office: [phone]  [name] 10/31/2018, 2:42 PM

## 2018-10-31 NOTE — Progress Notes (Addendum)
Family Medicine Teaching Service Daily Progress Note Intern Pager: [phone]  Patient name: Tamara Watson Medical record number: [id number] Date of birth: 29-Feb-1970 Age: 48 y.o. Gender: female  Primary Care Provider: [name], [name] Him, MD Consultants: urology, IR, CCM Code Status: Full  Pt Overview and Major Events to Date:  9/27: Patient admitted to [facility] after nephrostomy tube placed urgently by IR 9/28: Blood culture and urine culture returned Klebsiella, antibiotics de-escalated to ceftriaxone, Levophed drip stopped 9/28: Patient transferred to [place] and Plan:  Sepsis due to Klebsiella bacteremia secondary to obstructing nephrolithiasis Improving.  Patient's vital signs are stable and within normal limits except for mild tachycardia to 107.  Currently on ceftriaxone 2 g daily for treatment of Klebsiella bacteremia.  Left-sided nephrostomy tube in place.  Pain medication regimen includes Norco 5-325 mg every 6 hours as needed and morphine 2 to 4 mg every 3 hours as needed.  Patient required 2 mg of morphine overnight.  Urine output 1800 mL over the last 24 hours. -Continue ceftriaxone 2 g daily, await culture sensitivities -Transition to p.o. antibiotic when vitals continue to be stable and sensitivities return -We will need to follow-up with urology once recovered from acute illness to have 5 mm left ureteral stone removed -Continue morphine and Norco for pain, wean as tolerated -Regular diet, monitor p.o. intake -On senna 2 tablets nightly as needed for narcotic induced constipation, add MiraLAX -Continue Flomax  Hypophosphatemia Phosphate 1.6 on 9/29. -Replete with sodium phosphate -Daily phosphate and magnesium  AKI Improving.  Creatinine 1.59 on admission, improved to 1.18 on 9/28, awaiting creatinine on 9/29.  Currently on NS infusion at 50 mL/h.  -Daily BMP -continue fluids  Thrombocytopenia Improving, platelets 53 on 9/29.  Concern for HIT  initially, although thrombocytopenia occurred earlier than would be expected after heparin administration on 9/27.  Platelets 70 on 9/27, trending down to 38 on 9/28.  HIT antibody pending, immature platelet fraction within normal limits.   D-dimer significantly elevated to 11.87, fibrinogen >800 on admission.  Values high likely due to acute illness.  Anemia Resolved.  Likely due to nephrostomy tube placement.  Hemoglobin 15.1 on admission, 12.1 9/29.  Will likely continue to recover. -daily CBC  Tachycardia HR in the 120s on 9/29, has been elevated since admission.  Likely due to pain, inflammatory response, and dehydration, but PE should be on the differential if HR does not improve. -consider CTA -consider increasing mIVF rate due to poor PO intake over past 24 hours  FEN/GI: regular diet PPx: protonix   Disposition: continued medical workup, progressive floor  Subjective:  Patient reports left side and back pain.  Objective: Temp:  [98.8 F (37.1 C)-100 F (37.8 C)] 99.9 F (37.7 C) (09/29 0316) Pulse Rate:  [105-134] 107 (09/29 0316) Resp:  [15-30] 18 (09/29 0316) BP: (91-130)/(54-112) 123/75 (09/29 0316) SpO2:  [94 %-100 %] 100 % (09/29 0316) Weight:  [101.3 kg] 101.3 kg (09/29 0328) Physical Exam: General: somnolent, but responding appropriately to questions Cardiovascular: RRR, no MRG Respiratory: CTAB, comfortable work of breathing on room air Abdomen: soft, nontender, nephrostomy tube site looks c/d/i Extremities: no pedal edema  Laboratory: Recent Labs  Lab 10/30/18 1042 10/30/18 1640 10/30/18 2254  WBC 8.0 9.0 10.0  HGB 12.1 11.4* 11.9*  HCT 34.7* 32.8* 34.4*  PLT 33* 36* 41*   Recent Labs  Lab 10/27/18 0750  10/29/18 1443 10/30/18 0017 10/30/18 1042  NA 139   < > 132* 137 136  K 3.6   < >  3.1* 3.1* 4.6  CL 105   < > 100 108 110  CO2 25   < > 18* 19* 20*  BUN 12   < > 18 18 14   CREATININE 0.90   < > 1.53* 1.56* 1.18*  CALCIUM 8.8*   < > 7.9*  7.1* 7.4*  PROT 7.2  --  6.1* 5.2*  --   BILITOT 0.8  --  1.2 1.8*  --   ALKPHOS 49  --  71 117  --   ALT 15  --  20 18  --   AST 18  --  25 26  --   GLUCOSE 135*   < > 117* 107* 109*   < > = values in this interval not displayed.    Imaging/Diagnostic Tests: Ct Abdomen Pelvis Wo Contrast  Result Date: 10/30/2018 CLINICAL DATA:  Flank pain. Pain after left percutaneous nephrostomy tube placement 1 flushing catheter. EXAM: CT ABDOMEN AND PELVIS WITHOUT CONTRAST TECHNIQUE: Multidetector CT imaging of the abdomen and pelvis was performed following the standard protocol without IV contrast. COMPARISON:  CT yesterday. FINDINGS: Lower chest: Left pleural effusion with adjacent atelectasis, minimally increased from prior. Trace right pleural effusion is new. Hepatobiliary: No focal hepatic abnormality on noncontrast exam. Mild steatosis. Prominent size liver spanning 22 cm cranial caudal. Gallbladder physiologically distended, no calcified stone. No biliary dilatation. Pancreas: No ductal dilatation or inflammation. Spleen: Normal in size without focal abnormality. Adrenals/Urinary Tract: Normal adrenal glands. Percutaneous left nephrostomy tube with pigtail in the renal pelvis. No fluid collection or abnormality along the course of the tubing. Persistent but slight decreased left hydronephrosis from prior exam. Persistent left hydroureter and periureteric stranding with obstructing 5 mm stone in the distal ureter. The stone is not significantly changed in position from prior exam. No evidence of perinephric or intra nephric fluid collection. No right hydronephrosis. No right urolithiasis. Right ureter is decompressed. Urinary bladder is minimally distended. No bladder stone. Stomach/Bowel: Stomach is within normal limits. Appendix appears normal. No evidence of bowel wall thickening, distention, or inflammatory changes. Vascular/Lymphatic: Abdominal aorta is normal in caliber. No bulky abdominopelvic  adenopathy. Reproductive: Unchanged from prior. Similar 3 cm right ovarian cyst. Prior hysterectomy. Other: Trace free fluid in the pelvis. No free air. No evidence of intra-abdominal abscess. Musculoskeletal: There are no acute or suspicious osseous abnormalities. IMPRESSION: 1. Percutaneous left nephrostomy tube with pigtail in the renal pelvis. No evidence of complication. Decreased hydronephrosis from prior exam. Unchanged obstructing 5 mm stone in the distal left ureter. 2. Left pleural effusion with adjacent atelectasis, minimally increased from prior. Trace right pleural effusion is new. 3. Mild hepatic steatosis and hepatomegaly. 4. Unchanged 3 cm right ovarian cyst. Electronically Signed   By: Keith Rake M.D.   On: 10/30/2018 23:39   Dg Chest Port 1 View  Result Date: 10/29/2018 CLINICAL DATA:  Tachycardia.  Left flank and abdominal pain. EXAM: PORTABLE CHEST 1 VIEW COMPARISON:  None. FINDINGS: The heart size and mediastinal contours are within normal limits. Opacity in the left base is identified which may represent atelectasis or infiltrate. The visualized skeletal structures are unremarkable. IMPRESSION: Left base opacity which may represent atelectasis or infiltrate Electronically Signed   By: Kerby Moors M.D.   On: 10/29/2018 20:14   Dg Chest Port 1 View  Result Date: 10/29/2018 CLINICAL DATA:  Pt states diagnosed with renal stones Friday, states pain has gotten worse and she has been febrile at home, unsure how high. C/o L flank and  abdominal pain. Pt denies vomiting but endorses nausea. Denies blood in urine. Last pai.*comment was truncated*Sepsis EXAM: PORTABLE CHEST 1 VIEW COMPARISON:  None. FINDINGS: Normal cardiac silhouette. Mild central venous congestion. No focal infiltrate. No pneumothorax. IMPRESSION: Mild central venous congestion. Electronically Signed   By: Suzy Bouchard M.D.   On: 10/29/2018 15:41   Ct Renal Stone Study  Result Date: 10/29/2018 CLINICAL DATA:   LEFT flank pain abdominal pain.  Nausea EXAM: CT ABDOMEN AND PELVIS WITHOUT CONTRAST TECHNIQUE: Multidetector CT imaging of the abdomen and pelvis was performed following the standard protocol without IV contrast. COMPARISON:  10/27/2018 FINDINGS: Lower chest: Lung bases clear Hepatobiliary: No focal hepatic lesion.  Gallbladder normal. Pancreas: Pancreas normal Spleen: Normal spleen Adrenals/Urinary Tract: Adrenal glands are normal. Mild renal edema, hydronephrosis, and hydroureter of the LEFT kidney again noted. The distal LEFT ureteral calculus has migrated approximately 1 cm compared to exam 10/27/2018. Calculus position at the level the LEFT operator space measuring 4 mm by 6 mm (image 77/3). Calculus is approximately 2 cm from the vesicoureteral junction. No nephrolithiasis.  No RIGHT ureterolithiasis.  No bladder calculi Stomach/Bowel: Stomach, small-bowel, appendix and cecum normal. The colon rectosigmoid colon normal. Vascular/Lymphatic: Abdominal aorta is normal caliber. There is no retroperitoneal or periportal lymphadenopathy. No pelvic lymphadenopathy. Reproductive: Post hysterectomy. Cystic enlargement of the RIGHT ovary to 4.2 x 3.8 cm again noted. Cystic component measures approximately 3.1 cm. Other: No free fluid Musculoskeletal: No aggressive osseous lesion IMPRESSION: 1. Partially obstructing calculus in the distal LEFT ureter has migrated approximately 1 cm towards the vesicoureteral junction. 2. Cystic lesion of the RIGHT ovary. No follow-up necessary in this age group. This recommendation follows ACR consensus guidelines: White Paper of the ACR Incidental Findings Committee II on Adnexal Findings. J Am Coll Radiol [id number]. Electronically Signed   By: Suzy Bouchard M.D.   On: 10/29/2018 15:37   Ct Renal Stone Study  Result Date: 10/27/2018 CLINICAL DATA:  Left flank and lower abdominal pain with nausea and vomiting. EXAM: CT ABDOMEN AND PELVIS WITHOUT CONTRAST TECHNIQUE:  Multidetector CT imaging of the abdomen and pelvis was performed following the standard protocol without IV contrast. COMPARISON:  None. FINDINGS: Lower chest: Mild dependent bibasilar atelectasis. No pleural or pericardial effusion. Hepatobiliary: No focal liver abnormality is seen. No gallstones, gallbladder wall thickening, or biliary dilatation. Pancreas: Unremarkable. No pancreatic ductal dilatation or surrounding inflammatory changes. Spleen: Normal in size without focal abnormality. Adrenals/Urinary Tract: The adrenal glands appear normal. There is mild to moderate left hydronephrosis with stranding about the left kidney and ureter due to a 0.5 cm distal left ureteral stone. No other urinary tract stones are identified. The kidneys otherwise appear normal. Urinary bladder is decompressed but otherwise unremarkable. Stomach/Bowel: Stomach is within normal limits. Appendix appears normal. No evidence of bowel wall thickening, distention, or inflammatory changes. Vascular/Lymphatic: No significant vascular findings are present. No enlarged abdominal or pelvic lymph nodes. Reproductive: Status post hysterectomy. No adnexal masses. Other: Small fat containing umbilical hernia noted. Stranding in subcutaneous fat anterior low pelvis is likely postoperative. Musculoskeletal: No acute or focal abnormality. IMPRESSION: Mild-to-moderate left hydronephrosis due to a 0.5 cm distal left ureteral stone. No other urinary tract stones are seen. Small fat containing umbilical hernia. Electronically Signed   By: Inge Rise M.D.   On: 10/27/2018 09:24   Ir Nephrostomy Placement Left  Result Date: 10/29/2018 CLINICAL DATA:  Obstructing left ureteral calculus, sepsis and need for emergent percutaneous nephrostomy tube placement. EXAM: 1. ULTRASOUND GUIDANCE  FOR PUNCTURE OF THE LEFT RENAL COLLECTING SYSTEM. 2. LEFT PERCUTANEOUS NEPHROSTOMY TUBE PLACEMENT. COMPARISON:  CT of the abdomen and pelvis earlier today.  ANESTHESIA/SEDATION: 2.0 mg IV Versed; 100 mcg IV Fentanyl. Total Moderate Sedation Time 22 minutes. The patient's level of consciousness and physiologic status were continuously monitored during the procedure by Radiology nursing. CONTRAST:  20 mL Omnipaque 300 MEDICATIONS: No additional medications. A scheduled dose of IV vancomycin was infusing during the procedure. FLUOROSCOPY TIME:  1 minutes and 36 seconds.  12.4 mGy. PROCEDURE: The procedure, risks, benefits, and alternatives were explained to the patient. Questions regarding the procedure were encouraged and answered. The patient understands and consents to the procedure. A time-out was performed prior to initiating the procedure. The left flank region was prepped with chlorhexidine in a sterile fashion, and a sterile drape was applied covering the operative field. A sterile gown and sterile gloves were used for the procedure. Local anesthesia was provided with 1% Lidocaine. Ultrasound was used to localize the left kidney. Under direct ultrasound guidance, a 21 gauge needle was advanced into the renal collecting system. Ultrasound image documentation was performed. Aspiration of urine sample was performed followed by contrast injection. A transitional dilator was advanced over a guidewire. Percutaneous tract dilatation was then performed over the guidewire. A 10-French percutaneous nephrostomy tube was then advanced and formed in the collecting system. Catheter position was confirmed by fluoroscopy after contrast injection. The catheter was secured at the skin with a Prolene retention suture and Stat-Lock device. A gravity bag was placed. COMPLICATIONS: None. FINDINGS: Ultrasound demonstrates hydronephrosis. After puncture of the lower pole collecting system, there was return of clear but foul-smelling urine. 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 After nephrostomy tube placement, the tube could not be completely formed in  the renal pelvis due to the duplicated nature of the collecting system and small size of the renal pelvis. There was some acute bleeding after nephrostomy tube placement with bloody urine return after tube placement. A urine sample was sent for culture analysis. IMPRESSION: Placement of 10 French left percutaneous nephrostomy tube. The tube was attached to gravity bag drainage. Initial urine output is bloody after tube placement. A urine sample was sent for culture analysis. Electronically Signed   By: Aletta Edouard M.D.   On: 10/29/2018 19:23   Vas [place] Lower Extremity Venous (dvt)  Result Date: 10/30/2018  Lower Venous Study Indications: Elevated d-dimer.  Limitations: Body habitus. Comparison Study: No prior study. Performing Technologist: Maudry Mayhew MHA, RDMS, RVT, RDCS  Examination Guidelines: A complete evaluation includes B-mode imaging, spectral Doppler, color Doppler, and power Doppler as needed of all accessible portions of each vessel. Bilateral testing is considered an integral part of a complete examination. Limited examinations for reoccurring indications may be performed as noted.  +---------+---------------+---------+-----------+----------+--------------+ RIGHT    CompressibilityPhasicitySpontaneityPropertiesThrombus Aging +---------+---------------+---------+-----------+----------+--------------+ CFV      Full           Yes      Yes                                 +---------+---------------+---------+-----------+----------+--------------+ SFJ      Full                                                        +---------+---------------+---------+-----------+----------+--------------+  FV Prox  Full                                                        +---------+---------------+---------+-----------+----------+--------------+ FV Mid   Full                                                         +---------+---------------+---------+-----------+----------+--------------+ FV DistalFull                                                        +---------+---------------+---------+-----------+----------+--------------+ PFV      Full                                                        +---------+---------------+---------+-----------+----------+--------------+ POP      Full           Yes      Yes                                 +---------+---------------+---------+-----------+----------+--------------+ PTV      Full                                                        +---------+---------------+---------+-----------+----------+--------------+ PERO     Full                                                        +---------+---------------+---------+-----------+----------+--------------+   +---------+---------------+---------+-----------+----------+--------------+ LEFT     CompressibilityPhasicitySpontaneityPropertiesThrombus Aging +---------+---------------+---------+-----------+----------+--------------+ CFV      Full           Yes      Yes                                 +---------+---------------+---------+-----------+----------+--------------+ SFJ      Full                                                        +---------+---------------+---------+-----------+----------+--------------+ FV Prox  Full                                                        +---------+---------------+---------+-----------+----------+--------------+  FV Mid   Full                                                        +---------+---------------+---------+-----------+----------+--------------+ FV DistalFull                                                        +---------+---------------+---------+-----------+----------+--------------+ PFV      Full                                                         +---------+---------------+---------+-----------+----------+--------------+ POP      Full           Yes      Yes                                 +---------+---------------+---------+-----------+----------+--------------+ PTV      Full                                                        +---------+---------------+---------+-----------+----------+--------------+ PERO     Full                                                        +---------+---------------+---------+-----------+----------+--------------+  Summary: Right: There is no evidence of deep vein thrombosis in the lower extremity. No cystic structure found in the popliteal fossa. Left: There is no evidence of deep vein thrombosis in the lower extremity. No cystic structure found in the popliteal fossa.  *See table(s) above for measurements and observations.    Preliminary      Kathrene Alu, MD 10/31/2018, 7:59 AM PGY-3, [facility] Intern pager: [phone], text pages welcome

## 2018-10-31 NOTE — Progress Notes (Signed)
Pt states the morphine and 1 norco have not helped a lot with her pain, morphine just makes her sleepy. Paged family medicine to share concerns, they stated they would add Toradol for pain.

## 2018-11-01 DIAGNOSIS — N201 Calculus of ureter: Secondary | ICD-10-CM

## 2018-11-01 DIAGNOSIS — R000 Tachycardia, unspecified: Secondary | ICD-10-CM

## 2018-11-01 LAB — RENAL FUNCTION PANEL
Albumin: 1.9 g/dL — ABNORMAL LOW (ref 3.5–5.0)
Anion gap: 9 (ref 5–15)
BUN: 12 mg/dL (ref 6–20)
CO2: 20 mmol/L — ABNORMAL LOW (ref 22–32)
Calcium: 8 mg/dL — ABNORMAL LOW (ref 8.9–10.3)
Chloride: 108 mmol/L (ref 98–111)
Creatinine, Ser: 1.21 mg/dL — ABNORMAL HIGH (ref 0.44–1.00)
GFR calc Af Amer: >60 mL/min (ref >60)
GFR calc non Af Amer: 54 mL/min — ABNORMAL LOW (ref >60)
Glucose, Bld: 97 mg/dL (ref 70–99)
Phosphorus: 2.5 mg/dL (ref 2.5–4.6)
Potassium: 3.7 mmol/L (ref 3.5–5.1)
Sodium: 137 mmol/L (ref 135–145)

## 2018-11-01 LAB — CBC
HCT: 38.2 % (ref 36.0–46.0)
Hemoglobin: 12.7 g/dL (ref 12.0–15.0)
MCH: 29 pg (ref 26.0–34.0)
MCHC: 33.2 g/dL (ref 30.0–36.0)
MCV: 87.2 fL (ref 80.0–100.0)
Platelets: 68 10*3/uL — ABNORMAL LOW (ref 150–400)
RBC: 4.38 MIL/uL (ref 3.87–5.11)
RDW: 14.4 % (ref 11.5–15.5)
WBC: 9.2 10*3/uL (ref 4.0–10.5)
nRBC: 0 % (ref 0.0–0.2)

## 2018-11-01 LAB — CULTURE, BLOOD (ROUTINE X 2): Special Requests: ADEQUATE

## 2018-11-01 MED ORDER — ONDANSETRON HCL 4 MG/2ML IJ SOLN
4.0000 mg | Freq: Three times a day (TID) | INTRAMUSCULAR | Status: DC | PRN
  Administered 2018-11-01 – 2018-11-04 (×7): 4 mg via INTRAVENOUS
  Filled 2018-11-01 (×7): qty 2

## 2018-11-01 MED ORDER — KETOROLAC TROMETHAMINE 15 MG/ML IJ SOLN
15.0000 mg | Freq: Three times a day (TID) | INTRAMUSCULAR | Status: DC | PRN
  Administered 2018-11-01 – 2018-11-02 (×2): 15 mg via INTRAVENOUS
  Filled 2018-11-01 (×2): qty 1

## 2018-11-01 MED ORDER — SENNOSIDES-DOCUSATE SODIUM 8.6-50 MG PO TABS
2.0000 | ORAL_TABLET | Freq: Every day | ORAL | Status: DC
  Administered 2018-11-01 – 2018-11-07 (×4): 2 via ORAL
  Filled 2018-11-01 (×8): qty 2

## 2018-11-01 MED ORDER — ONDANSETRON HCL 4 MG PO TABS: 4.0000 mg | ORAL_TABLET | Freq: Three times a day (TID) | ORAL | Status: DC | PRN

## 2018-11-01 MED ORDER — ACETAMINOPHEN 325 MG PO TABS
650.0000 mg | ORAL_TABLET | Freq: Four times a day (QID) | ORAL | Status: DC
  Administered 2018-11-01 – 2018-11-02 (×6): 650 mg via ORAL
  Filled 2018-11-01 (×6): qty 2

## 2018-11-01 MED ORDER — NYSTATIN 100000 UNIT/ML MT SUSP
5.0000 mL | Freq: Four times a day (QID) | OROMUCOSAL | Status: DC
  Administered 2018-11-01 – 2018-11-09 (×16): 500000 [IU] via OROMUCOSAL
  Filled 2018-11-01 (×20): qty 5

## 2018-11-01 NOTE — Progress Notes (Signed)
OT Cancellation Note  Patient Details Name: Tamara Watson MRN: [id number] DOB: 1970/08/25   Cancelled Treatment:    Reason Eval/Treat Not Completed: Patient declined, no reason specified(First attempt @ 1452 and pt sleeping soundly. Second attempt @ 1535 Pt reporting she very tired and agreeable to therapy tomorrow.)  [place], OTR/L Acute Rehab Pager: [phone] Office: [phone] 11/01/2018, 4:18 PM

## 2018-11-01 NOTE — Progress Notes (Addendum)
Family Medicine Teaching Service Daily Progress Note Intern Pager: [phone]  Patient name: Tamara Watson Medical record number: [id number] Date of birth: [birth year] Age: 48 y.o. Gender: female  Primary Care Provider: [name], [name] Him, MD Consultants: urology, IR, CCM Code Status: Full  Pt Overview and Major Events to Date:  9/27: Patient admitted to [facility] after nephrostomy tube placed urgently by IR 9/28: Blood culture and urine culture returned Klebsiella, antibiotics de-escalated to ceftriaxone, Levophed drip stopped 9/28: Patient transferred to [place] 9/29: transition to CTX to Cefazolin  Antibiotics: Vancomycin 9/27 Cefepime 9/27 Ceftriaxone 9/28-9/29 Cefazolin 9/29-  Assessment and Plan: Tamara Watson is a 48 y.o. female presenting with presumed sepsis with lactic acidosis most likely due to ureteral stone . PMH is significant for history of anemia due to menorrhagia and fibroids (s/p partial hysterectomy 06/2018), anxiety, constipation, GERD, and seasonal allergies.  Sepsis due to Klebsiella bacteremia secondary to obstructing nephrolithiasis  S/p Left Nephrostomy Tube placement, improving Improving. Patient's vitals continue to reamin stable however does have persistent mild tachycardia to 110's. Febrile to 100.9 overnight. No leukocytosis. Transitioned from CTX to Cefazolin 2g q8h for her Klebsiella bacteremia based on sensitivities. Pain management regimen includes Norco 5-325mg  q6 hours PRN, Morphine 2-4mg  q3h PRN. Patient required Norco x 2 and Morphine x 3. She received one dose of Toradol yesterday. She notes minimal improvement with narcotics. She endorses rebound headaches with narcotics that improve with tylenol. She does endorse improvement with the dose of Toradol. UOP 1.7L overnight, however patient continues to take minimal PO intake due to acute illness. Urine from nephrostomy tube remains blood tinged. Creatinine stable 1.00>1.21 at baseline.  Electrolytes stable. Patient would like to see urology to request surgery, however repeatedly explained to patient they would not likely do surgery until acute infection resolves.  - IR following for nephrostomy care - continue Cefazolin 2g q8 hours -Transition to p.o. antibiotic when vitals continue to be stable -We will need to follow-up with urology once recovered from acute illness to have 5 mm left ureteral stone removed - discontinue Norco given minimal improvement - Schedule tylenol  - Morphine 2-4mg  q3hours PRN  - low dose Toradol at 15mg  q8 hours PRN for moderate pain x 4 doses. Consider continuing for full 5 days if kidney function remains stable - Regular diet, monitor p.o. intake - increase fluids to 133mL/hr NS - Continue Miralax and Senna as needed for narcotic induced constipation -Continue Flomax - will consult urology to discuss stone management  Oropharyngeal Thrush: Physical exam notable for white plaques on tongue and buccal mucosa. Patient complains of trouble swallowing and blood tinged spit (not sputum, no cough). Concerning for thrush. Patient endorses frequent candidal infection when taking antibiotics. - start Nystatin 56mL QID x 10 days  Hypophosphatemia, resolved Phosphate improved from 1.6 to 2.5 s/p repletion -Replete as needed -Daily phosphate and magnesium  AKI, resolved Creatinine 1.59 on admission. Stable at 1.21 this AM. Creatinine 1.59 on admission, improved to 1.18 on 9/28, awaiting creatinine on 9/29.  Currently on NS infusion at 100 mL/h.  -Daily BMP -continue fluids - monitor closely with addition of Toradol, consider stopping if kidney function worsens  Thrombocytopenia, improving Platelets up trending from 53 to 68 this AM. Expect secondary to sepsis. Concern initially for HIT although timing does not add up (thrombocytopenia prior to heparin infusion), however Heparin antibody obtained and negative.  - daily CBC  Anemia, Resolved.  Likely  due to nephrostomy tube placement.  Hemoglobin 15.1 on admission, 12.7  this AM.  -daily CBC  Tachycardia HR in the 110s overnight, has been elevated since admission. Likely due to pain, inflammatory response, and fever overnight. Some concern for dehydration. Fluids increased overnight to 125mL/hr. Must consider PE if HR does not continue to improve, however normal respiratory rate on room air is reassuring. -consider CTA if tachycardia remains once fever and acute infection resolves   FEN/GI: regular diet PPx: protonix  Disposition: continued medical workup, progressive floor  Subjective:  Patient continues to note how she feels awful. She continues to have left flank pain with minimal improvement with Norco or morphine. She notes she get rebound headaches after the norco that are improved with tylenol. She does endorse much improvement with the toradol she got yesterday. She endorses blood tinged sputum but no cough or difficulty breathing. Denies any chest pain or leg swelling.  Objective: Temp:  [98.4 F (36.9 C)-100.9 F (38.3 C)] 100.2 F (37.9 C) (09/30 0745) Pulse Rate:  [105-122] 107 (09/30 0745) Resp:  [16-26] 16 (09/30 0745) BP: (108-133)/(63-79) 131/79 (09/30 0745) SpO2:  [94 %-98 %] 98 % (09/30 0745) Weight:  [101 kg] 101 kg (09/30 0500) Physical Exam: General: appears to feel unwell, sitting up in exam bed with nurse at bed side, non-toxic appearing HEENT: tongue with thick white coating along entire tongue and small plaques on buccal mucosa CV: regular rate and rhythm without murmurs, rubs, or gallops, no lower extremity edema, mild puffiness to dorsal feet without pain, 2+ radial and pedal pulses bilaterally Lungs: clear to auscultation bilaterally with normal work of breathing on room air, no crackles or wheezing Abdomen: soft, non-tender, non-distended, normoactive bowel sounds, left nephrostomy bandage clean and intact, no surrounding erythema or edema Skin: warm,  dry Extremities: warm and well perfused Neuro: Alert and oriented, speech normal  Laboratory: Recent Labs  Lab 10/30/18 2254 10/31/18 0637 11/01/18 0111  WBC 10.0 10.1 9.2  HGB 11.9* 12.1 12.7  HCT 34.4* 36.5 38.2  PLT 41* 53* 68*   Recent Labs  Lab 10/27/18 0750  10/29/18 1443 10/30/18 0017 10/30/18 1042 10/31/18 1023 11/01/18 0111  NA 139   < > 132* 137 136 136 137  K 3.6   < > 3.1* 3.1* 4.6 3.8 3.7  CL 105   < > 100 108 110 110 108  CO2 25   < > 18* 19* 20* 20* 20*  BUN 12   < > 18 18 14 11 12   CREATININE 0.90   < > 1.53* 1.56* 1.18* 1.00 1.21*  CALCIUM 8.8*   < > 7.9* 7.1* 7.4* 7.9* 8.0*  PROT 7.2  --  6.1* 5.2*  --   --   --   BILITOT 0.8  --  1.2 1.8*  --   --   --   ALKPHOS 49  --  71 117  --   --   --   ALT 15  --  20 18  --   --   --   AST 18  --  25 26  --   --   --   GLUCOSE 135*   < > 117* 107* 109* 111* 97   < > = values in this interval not displayed.    Imaging/Diagnostic Tests: Ct Abdomen Pelvis Wo Contrast  Result Date: 10/30/2018 CLINICAL DATA:  Flank pain. Pain after left percutaneous nephrostomy tube placement 1 flushing catheter. EXAM: CT ABDOMEN AND PELVIS WITHOUT CONTRAST TECHNIQUE: Multidetector CT imaging of the abdomen and pelvis was performed  following the standard protocol without IV contrast. COMPARISON:  CT yesterday. FINDINGS: Lower chest: Left pleural effusion with adjacent atelectasis, minimally increased from prior. Trace right pleural effusion is new. Hepatobiliary: No focal hepatic abnormality on noncontrast exam. Mild steatosis. Prominent size liver spanning 22 cm cranial caudal. Gallbladder physiologically distended, no calcified stone. No biliary dilatation. Pancreas: No ductal dilatation or inflammation. Spleen: Normal in size without focal abnormality. Adrenals/Urinary Tract: Normal adrenal glands. Percutaneous left nephrostomy tube with pigtail in the renal pelvis. No fluid collection or abnormality along the course of the tubing.  Persistent but slight decreased left hydronephrosis from prior exam. Persistent left hydroureter and periureteric stranding with obstructing 5 mm stone in the distal ureter. The stone is not significantly changed in position from prior exam. No evidence of perinephric or intra nephric fluid collection. No right hydronephrosis. No right urolithiasis. Right ureter is decompressed. Urinary bladder is minimally distended. No bladder stone. Stomach/Bowel: Stomach is within normal limits. Appendix appears normal. No evidence of bowel wall thickening, distention, or inflammatory changes. Vascular/Lymphatic: Abdominal aorta is normal in caliber. No bulky abdominopelvic adenopathy. Reproductive: Unchanged from prior. Similar 3 cm right ovarian cyst. Prior hysterectomy. Other: Trace free fluid in the pelvis. No free air. No evidence of intra-abdominal abscess. Musculoskeletal: There are no acute or suspicious osseous abnormalities. IMPRESSION: 1. Percutaneous left nephrostomy tube with pigtail in the renal pelvis. No evidence of complication. Decreased hydronephrosis from prior exam. Unchanged obstructing 5 mm stone in the distal left ureter. 2. Left pleural effusion with adjacent atelectasis, minimally increased from prior. Trace right pleural effusion is new. 3. Mild hepatic steatosis and hepatomegaly. 4. Unchanged 3 cm right ovarian cyst. Electronically Signed   By: Keith Rake M.D.   On: 10/30/2018 23:39   Dg Chest Port 1 View  Result Date: 10/29/2018 CLINICAL DATA:  Tachycardia.  Left flank and abdominal pain. EXAM: PORTABLE CHEST 1 VIEW COMPARISON:  None. FINDINGS: The heart size and mediastinal contours are within normal limits. Opacity in the left base is identified which may represent atelectasis or infiltrate. The visualized skeletal structures are unremarkable. IMPRESSION: Left base opacity which may represent atelectasis or infiltrate Electronically Signed   By: Kerby Moors M.D.   On: 10/29/2018 20:14    Dg Chest Port 1 View  Result Date: 10/29/2018 CLINICAL DATA:  Pt states diagnosed with renal stones Friday, states pain has gotten worse and she has been febrile at home, unsure how high. C/o L flank and abdominal pain. Pt denies vomiting but endorses nausea. Denies blood in urine. Last pai.*comment was truncated*Sepsis EXAM: PORTABLE CHEST 1 VIEW COMPARISON:  None. FINDINGS: Normal cardiac silhouette. Mild central venous congestion. No focal infiltrate. No pneumothorax. IMPRESSION: Mild central venous congestion. Electronically Signed   By: Suzy Bouchard M.D.   On: 10/29/2018 15:41   Ct Renal Stone Study  Result Date: 10/29/2018 CLINICAL DATA:  LEFT flank pain abdominal pain.  Nausea EXAM: CT ABDOMEN AND PELVIS WITHOUT CONTRAST TECHNIQUE: Multidetector CT imaging of the abdomen and pelvis was performed following the standard protocol without IV contrast. COMPARISON:  10/27/2018 FINDINGS: Lower chest: Lung bases clear Hepatobiliary: No focal hepatic lesion.  Gallbladder normal. Pancreas: Pancreas normal Spleen: Normal spleen Adrenals/Urinary Tract: Adrenal glands are normal. Mild renal edema, hydronephrosis, and hydroureter of the LEFT kidney again noted. The distal LEFT ureteral calculus has migrated approximately 1 cm compared to exam 10/27/2018. Calculus position at the level the LEFT operator space measuring 4 mm by 6 mm (image 77/3). Calculus is approximately  2 cm from the vesicoureteral junction. No nephrolithiasis.  No RIGHT ureterolithiasis.  No bladder calculi Stomach/Bowel: Stomach, small-bowel, appendix and cecum normal. The colon rectosigmoid colon normal. Vascular/Lymphatic: Abdominal aorta is normal caliber. There is no retroperitoneal or periportal lymphadenopathy. No pelvic lymphadenopathy. Reproductive: Post hysterectomy. Cystic enlargement of the RIGHT ovary to 4.2 x 3.8 cm again noted. Cystic component measures approximately 3.1 cm. Other: No free fluid Musculoskeletal: No aggressive  osseous lesion IMPRESSION: 1. Partially obstructing calculus in the distal LEFT ureter has migrated approximately 1 cm towards the vesicoureteral junction. 2. Cystic lesion of the RIGHT ovary. No follow-up necessary in this age group. This recommendation follows ACR consensus guidelines: White Paper of the ACR Incidental Findings Committee II on Adnexal Findings. J Am Coll Radiol [id number]. Electronically Signed   By: Suzy Bouchard M.D.   On: 10/29/2018 15:37   Ct Renal Stone Study  Result Date: 10/27/2018 CLINICAL DATA:  Left flank and lower abdominal pain with nausea and vomiting. EXAM: CT ABDOMEN AND PELVIS WITHOUT CONTRAST TECHNIQUE: Multidetector CT imaging of the abdomen and pelvis was performed following the standard protocol without IV contrast. COMPARISON:  None. FINDINGS: Lower chest: Mild dependent bibasilar atelectasis. No pleural or pericardial effusion. Hepatobiliary: No focal liver abnormality is seen. No gallstones, gallbladder wall thickening, or biliary dilatation. Pancreas: Unremarkable. No pancreatic ductal dilatation or surrounding inflammatory changes. Spleen: Normal in size without focal abnormality. Adrenals/Urinary Tract: The adrenal glands appear normal. There is mild to moderate left hydronephrosis with stranding about the left kidney and ureter due to a 0.5 cm distal left ureteral stone. No other urinary tract stones are identified. The kidneys otherwise appear normal. Urinary bladder is decompressed but otherwise unremarkable. Stomach/Bowel: Stomach is within normal limits. Appendix appears normal. No evidence of bowel wall thickening, distention, or inflammatory changes. Vascular/Lymphatic: No significant vascular findings are present. No enlarged abdominal or pelvic lymph nodes. Reproductive: Status post hysterectomy. No adnexal masses. Other: Small fat containing umbilical hernia noted. Stranding in subcutaneous fat anterior low pelvis is likely postoperative.  Musculoskeletal: No acute or focal abnormality. IMPRESSION: Mild-to-moderate left hydronephrosis due to a 0.5 cm distal left ureteral stone. No other urinary tract stones are seen. Small fat containing umbilical hernia. Electronically Signed   By: Inge Rise M.D.   On: 10/27/2018 09:24   Ir Nephrostomy Placement Left  Result Date: 10/29/2018 CLINICAL DATA:  Obstructing left ureteral calculus, sepsis and need for emergent percutaneous nephrostomy tube placement. EXAM: 1. ULTRASOUND GUIDANCE FOR PUNCTURE OF THE LEFT RENAL COLLECTING SYSTEM. 2. LEFT PERCUTANEOUS NEPHROSTOMY TUBE PLACEMENT. COMPARISON:  CT of the abdomen and pelvis earlier today. ANESTHESIA/SEDATION: 2.0 mg IV Versed; 100 mcg IV Fentanyl. Total Moderate Sedation Time 22 minutes. The patient's level of consciousness and physiologic status were continuously monitored during the procedure by Radiology nursing. CONTRAST:  20 mL Omnipaque 300 MEDICATIONS: No additional medications. A scheduled dose of IV vancomycin was infusing during the procedure. FLUOROSCOPY TIME:  1 minutes and 36 seconds.  12.4 mGy. PROCEDURE: The procedure, risks, benefits, and alternatives were explained to the patient. Questions regarding the procedure were encouraged and answered. The patient understands and consents to the procedure. A time-out was performed prior to initiating the procedure. The left flank region was prepped with chlorhexidine in a sterile fashion, and a sterile drape was applied covering the operative field. A sterile gown and sterile gloves were used for the procedure. Local anesthesia was provided with 1% Lidocaine. Ultrasound was used to localize the left kidney. Under direct ultrasound  guidance, a 21 gauge needle was advanced into the renal collecting system. Ultrasound image documentation was performed. Aspiration of urine sample was performed followed by contrast injection. A transitional dilator was advanced over a guidewire. Percutaneous tract  dilatation was then performed over the guidewire. A 10-French percutaneous nephrostomy tube was then advanced and formed in the collecting system. Catheter position was confirmed by fluoroscopy after contrast injection. The catheter was secured at the skin with a Prolene retention suture and Stat-Lock device. A gravity bag was placed. COMPLICATIONS: None. FINDINGS: Ultrasound demonstrates hydronephrosis. After puncture of the lower pole collecting system, there was return of clear but foul-smelling urine. 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 After nephrostomy tube placement, the tube could not be completely formed in the renal pelvis due to the duplicated nature of the collecting system and small size of the renal pelvis. There was some acute bleeding after nephrostomy tube placement with bloody urine return after tube placement. A urine sample was sent for culture analysis. IMPRESSION: Placement of 10 French left percutaneous nephrostomy tube. The tube was attached to gravity bag drainage. Initial urine output is bloody after tube placement. A urine sample was sent for culture analysis. Electronically Signed   By: Aletta Edouard M.D.   On: 10/29/2018 19:23   Vas [place] Lower Extremity Venous (dvt)  Result Date: 10/31/2018  Lower Venous Study Indications: Elevated d-dimer.  Limitations: Body habitus. Comparison Study: No prior study. Performing Technologist: Maudry Mayhew MHA, RDMS, RVT, RDCS  Examination Guidelines: A complete evaluation includes B-mode imaging, spectral Doppler, color Doppler, and power Doppler as needed of all accessible portions of each vessel. Bilateral testing is considered an integral part of a complete examination. Limited examinations for reoccurring indications may be performed as noted.  +---------+---------------+---------+-----------+----------+--------------+ RIGHT    CompressibilityPhasicitySpontaneityPropertiesThrombus Aging  +---------+---------------+---------+-----------+----------+--------------+ CFV      Full           Yes      Yes                                 +---------+---------------+---------+-----------+----------+--------------+ SFJ      Full                                                        +---------+---------------+---------+-----------+----------+--------------+ FV Prox  Full                                                        +---------+---------------+---------+-----------+----------+--------------+ FV Mid   Full                                                        +---------+---------------+---------+-----------+----------+--------------+ FV DistalFull                                                        +---------+---------------+---------+-----------+----------+--------------+  PFV      Full                                                        +---------+---------------+---------+-----------+----------+--------------+ POP      Full           Yes      Yes                                 +---------+---------------+---------+-----------+----------+--------------+ PTV      Full                                                        +---------+---------------+---------+-----------+----------+--------------+ PERO     Full                                                        +---------+---------------+---------+-----------+----------+--------------+   +---------+---------------+---------+-----------+----------+--------------+ LEFT     CompressibilityPhasicitySpontaneityPropertiesThrombus Aging +---------+---------------+---------+-----------+----------+--------------+ CFV      Full           Yes      Yes                                 +---------+---------------+---------+-----------+----------+--------------+ SFJ      Full                                                         +---------+---------------+---------+-----------+----------+--------------+ FV Prox  Full                                                        +---------+---------------+---------+-----------+----------+--------------+ FV Mid   Full                                                        +---------+---------------+---------+-----------+----------+--------------+ FV DistalFull                                                        +---------+---------------+---------+-----------+----------+--------------+ PFV      Full                                                        +---------+---------------+---------+-----------+----------+--------------+  POP      Full           Yes      Yes                                 +---------+---------------+---------+-----------+----------+--------------+ PTV      Full                                                        +---------+---------------+---------+-----------+----------+--------------+ PERO     Full                                                        +---------+---------------+---------+-----------+----------+--------------+  Summary: Right: There is no evidence of deep vein thrombosis in the lower extremity. No cystic structure found in the popliteal fossa. Left: There is no evidence of deep vein thrombosis in the lower extremity. No cystic structure found in the popliteal fossa.  *See table(s) above for measurements and observations. Electronically signed by Deitra Mayo MD on 10/31/2018 at 4:34:24 PM.    Final      Danna Hefty, DO 11/01/2018, 8:17 AM PGY-2, [place] Intern pager: [phone], text pages welcome

## 2018-11-01 NOTE — Progress Notes (Signed)
FPTS Interim Progress Note  Spoke to Dr. Meriam Sprague with urology. He notes that Dr. Alyson Ingles will hopefully plan to come by to see patient in the coming days. He apologizes that no one has been by since her procedure. He notes that it is normal for fever's to linger with pyelonephritis but should be a down trend in her fever curve overall. He recommends 10-14 days of antibiotic coverage starting when culture adequate coverage was started. Given cultures were sensitive to Cefepime, CTX, and Cefazolin, she has been on adequate coverage since admission, thus start date is 9/27. He recommends discharge when patient is improved and taking adequate PO. Patient should follow up with her urologist, Dr. Alyson Ingles, around the end of her antibiotic course for evaluation and management. They will discuss treatment of stone at that time.  - Plan for antibiotic coverage for 14 days (9/27-10/6) - Continue Cefazolin, plan to transition to PO when tolerating PO more - continue to adjust pain meds to find regimen that provides adequate relief - follow up urology recs   Danna Hefty, DO 11/01/2018, 1:56 PM PGY-2, [place] Medicine Service pager [phone]

## 2018-11-01 NOTE — Progress Notes (Signed)
Referring Physician(s): Dr. Alinda Money  Supervising Physician: Corrie Mckusick  Patient Status:  [facility] - In-pt  Chief Complaint: Urosepsis  Subjective: Patient still with significant pain not improved with PCN placement. Bloody urine in collection bag. Tachycardic. Tmax 100.9 this AM.  Tearful with lack of progress.  Allergies: Patient has no known allergies.  Medications: Prior to Admission medications   Medication Sig Start Date End Date Taking? Authorizing Provider  acetaminophen (TYLENOL) 500 MG tablet Take 1,000 mg by mouth every 6 (six) hours as needed for headache.   Yes [provider]  dexlansoprazole (DEXILANT) 60 MG capsule TAKE 1 CAPSULE BY MOUTH DAILY BEFORE BREAKFAST. Patient taking differently: Take 60 mg by mouth daily before breakfast.  08/01/18  Yes Nandigam, Venia Minks, MD  fluticasone (FLONASE) 50 MCG/ACT nasal spray Place 2 sprays into both nostrils daily. Patient taking differently: Place 2 sprays into both nostrils daily as needed for allergies.  11/08/16  Yes Rogue Bussing, MD  HYDROcodone-acetaminophen (NORCO/VICODIN) 5-325 MG tablet Take 1 tablet by mouth every 6 (six) hours as needed for up to 3 days for severe pain. 10/27/18 10/30/18 Yes Madilyn Hook A, PA-C  ibuprofen (ADVIL) 200 MG tablet Take 600 mg by mouth every 6 (six) hours as needed for headache (pain).   Yes [provider]  ondansetron (ZOFRAN) 4 MG tablet Take 1 tablet (4 mg total) by mouth every 6 (six) hours. 10/27/18  Yes Alveria Apley, PA-C  oxyCODONE-acetaminophen (PERCOCET/ROXICET) 5-325 MG tablet Take 1-2 tablets by mouth every 6 (six) hours as needed for moderate pain (once the patient tolerates po and PCA has been discontinued). Patient taking differently: Take 1-2 tablets by mouth every 6 (six) hours as needed for moderate pain.  06/16/18  Yes Waymon Amato, MD  senna-docusate (SENOKOT-S) 8.6-50 MG tablet Take 2 tablets by mouth at bedtime as needed for mild  constipation or moderate constipation. 06/16/18  Yes Waymon Amato, MD  tamsulosin (FLOMAX) 0.4 MG CAPS capsule Take 1 capsule (0.4 mg total) by mouth daily. 10/27/18 11/26/18 Yes Madilyn Hook A, PA-C  vitamin C (ASCORBIC ACID) 500 MG tablet Take 500 mg by mouth daily as needed (immune system boost/ cold symptoms).    Yes [provider]  benzonatate (TESSALON) 100 MG capsule Take 1 capsule (100 mg total) by mouth 3 (three) times daily as needed for cough. Patient not taking: Reported on 10/29/2018 01/09/18   [place] Bing, DO  docusate sodium (COLACE) 100 MG capsule Take 1 capsule (100 mg total) by mouth 2 (two) times daily. Patient not taking: Reported on 10/29/2018 06/16/18   Waymon Amato, MD  ibuprofen (ADVIL) 600 MG tablet Take 1 tablet (600 mg total) by mouth every 6 (six) hours. Patient not taking: Reported on 10/29/2018 06/19/18   Waymon Amato, MD  lidocaine (LIDODERM) 5 % Place 1 patch onto the skin daily. Remove & Discard patch within 12 hours or as directed by MD Patient not taking: Reported on 10/29/2018 06/16/18   Waymon Amato, MD  LINZESS 72 MCG capsule TAKE 1 CAPSULE (72 MCG TOTAL) BY MOUTH DAILY BEFORE BREAKFAST. Patient not taking: No sig reported 11/03/17   Mauri Pole, MD  pantoprazole (PROTONIX) 40 MG tablet Take 1 tablet (40 mg total) by mouth daily. Patient not taking: Reported on 10/29/2018 06/17/18   Waymon Amato, MD     Vital Signs: BP 124/78 (BP Location: Right Arm)    Pulse 100    Temp 100 F (37.8 C) (Oral)  Resp 16    Ht 5' 5.5" (1.664 m)    Wt 222 lb 10.6 oz (101 kg)    LMP 06/05/2018    SpO2 100%    BMI 36.49 kg/m   Physical Exam  NAD, alert Abdomen:  Generalized tenderness to left flank slightly improved. L PCN in place.  Insertion site c/d/i. Bloody urine output.  Cannot tolerate flush.   Imaging: Ct Abdomen Pelvis Wo Contrast  Result Date: 10/30/2018 CLINICAL DATA:  Flank pain. Pain after left percutaneous nephrostomy tube placement 1 flushing catheter.  EXAM: CT ABDOMEN AND PELVIS WITHOUT CONTRAST TECHNIQUE: Multidetector CT imaging of the abdomen and pelvis was performed following the standard protocol without IV contrast. COMPARISON:  CT yesterday. FINDINGS: Lower chest: Left pleural effusion with adjacent atelectasis, minimally increased from prior. Trace right pleural effusion is new. Hepatobiliary: No focal hepatic abnormality on noncontrast exam. Mild steatosis. Prominent size liver spanning 22 cm cranial caudal. Gallbladder physiologically distended, no calcified stone. No biliary dilatation. Pancreas: No ductal dilatation or inflammation. Spleen: Normal in size without focal abnormality. Adrenals/Urinary Tract: Normal adrenal glands. Percutaneous left nephrostomy tube with pigtail in the renal pelvis. No fluid collection or abnormality along the course of the tubing. Persistent but slight decreased left hydronephrosis from prior exam. Persistent left hydroureter and periureteric stranding with obstructing 5 mm stone in the distal ureter. The stone is not significantly changed in position from prior exam. No evidence of perinephric or intra nephric fluid collection. No right hydronephrosis. No right urolithiasis. Right ureter is decompressed. Urinary bladder is minimally distended. No bladder stone. Stomach/Bowel: Stomach is within normal limits. Appendix appears normal. No evidence of bowel wall thickening, distention, or inflammatory changes. Vascular/Lymphatic: Abdominal aorta is normal in caliber. No bulky abdominopelvic adenopathy. Reproductive: Unchanged from prior. Similar 3 cm right ovarian cyst. Prior hysterectomy. Other: Trace free fluid in the pelvis. No free air. No evidence of intra-abdominal abscess. Musculoskeletal: There are no acute or suspicious osseous abnormalities. IMPRESSION: 1. Percutaneous left nephrostomy tube with pigtail in the renal pelvis. No evidence of complication. Decreased hydronephrosis from prior exam. Unchanged  obstructing 5 mm stone in the distal left ureter. 2. Left pleural effusion with adjacent atelectasis, minimally increased from prior. Trace right pleural effusion is new. 3. Mild hepatic steatosis and hepatomegaly. 4. Unchanged 3 cm right ovarian cyst. Electronically Signed   By: Keith Rake M.D.   On: 10/30/2018 23:39   Dg Chest Port 1 View  Result Date: 10/29/2018 CLINICAL DATA:  Tachycardia.  Left flank and abdominal pain. EXAM: PORTABLE CHEST 1 VIEW COMPARISON:  None. FINDINGS: The heart size and mediastinal contours are within normal limits. Opacity in the left base is identified which may represent atelectasis or infiltrate. The visualized skeletal structures are unremarkable. IMPRESSION: Left base opacity which may represent atelectasis or infiltrate Electronically Signed   By: Kerby Moors M.D.   On: 10/29/2018 20:14   Dg Chest Port 1 View  Result Date: 10/29/2018 CLINICAL DATA:  Pt states diagnosed with renal stones Friday, states pain has gotten worse and she has been febrile at home, unsure how high. C/o L flank and abdominal pain. Pt denies vomiting but endorses nausea. Denies blood in urine. Last pai.*comment was truncated*Sepsis EXAM: PORTABLE CHEST 1 VIEW COMPARISON:  None. FINDINGS: Normal cardiac silhouette. Mild central venous congestion. No focal infiltrate. No pneumothorax. IMPRESSION: Mild central venous congestion. Electronically Signed   By: Suzy Bouchard M.D.   On: 10/29/2018 15:41   Ct Renal Stone Study  Result  Date: 10/29/2018 CLINICAL DATA:  LEFT flank pain abdominal pain.  Nausea EXAM: CT ABDOMEN AND PELVIS WITHOUT CONTRAST TECHNIQUE: Multidetector CT imaging of the abdomen and pelvis was performed following the standard protocol without IV contrast. COMPARISON:  10/27/2018 FINDINGS: Lower chest: Lung bases clear Hepatobiliary: No focal hepatic lesion.  Gallbladder normal. Pancreas: Pancreas normal Spleen: Normal spleen Adrenals/Urinary Tract: Adrenal glands are  normal. Mild renal edema, hydronephrosis, and hydroureter of the LEFT kidney again noted. The distal LEFT ureteral calculus has migrated approximately 1 cm compared to exam 10/27/2018. Calculus position at the level the LEFT operator space measuring 4 mm by 6 mm (image 77/3). Calculus is approximately 2 cm from the vesicoureteral junction. No nephrolithiasis.  No RIGHT ureterolithiasis.  No bladder calculi Stomach/Bowel: Stomach, small-bowel, appendix and cecum normal. The colon rectosigmoid colon normal. Vascular/Lymphatic: Abdominal aorta is normal caliber. There is no retroperitoneal or periportal lymphadenopathy. No pelvic lymphadenopathy. Reproductive: Post hysterectomy. Cystic enlargement of the RIGHT ovary to 4.2 x 3.8 cm again noted. Cystic component measures approximately 3.1 cm. Other: No free fluid Musculoskeletal: No aggressive osseous lesion IMPRESSION: 1. Partially obstructing calculus in the distal LEFT ureter has migrated approximately 1 cm towards the vesicoureteral junction. 2. Cystic lesion of the RIGHT ovary. No follow-up necessary in this age group. This recommendation follows ACR consensus guidelines: White Paper of the ACR Incidental Findings Committee II on Adnexal Findings. J Am Coll Radiol [id number]. Electronically Signed   By: Suzy Bouchard M.D.   On: 10/29/2018 15:37   Ir Nephrostomy Placement Left  Result Date: 10/29/2018 CLINICAL DATA:  Obstructing left ureteral calculus, sepsis and need for emergent percutaneous nephrostomy tube placement. EXAM: 1. ULTRASOUND GUIDANCE FOR PUNCTURE OF THE LEFT RENAL COLLECTING SYSTEM. 2. LEFT PERCUTANEOUS NEPHROSTOMY TUBE PLACEMENT. COMPARISON:  CT of the abdomen and pelvis earlier today. ANESTHESIA/SEDATION: 2.0 mg IV Versed; 100 mcg IV Fentanyl. Total Moderate Sedation Time 22 minutes. The patient's level of consciousness and physiologic status were continuously monitored during the procedure by Radiology nursing. CONTRAST:  20 mL  Omnipaque 300 MEDICATIONS: No additional medications. A scheduled dose of IV vancomycin was infusing during the procedure. FLUOROSCOPY TIME:  1 minutes and 36 seconds.  12.4 mGy. PROCEDURE: The procedure, risks, benefits, and alternatives were explained to the patient. Questions regarding the procedure were encouraged and answered. The patient understands and consents to the procedure. A time-out was performed prior to initiating the procedure. The left flank region was prepped with chlorhexidine in a sterile fashion, and a sterile drape was applied covering the operative field. A sterile gown and sterile gloves were used for the procedure. Local anesthesia was provided with 1% Lidocaine. Ultrasound was used to localize the left kidney. Under direct ultrasound guidance, a 21 gauge needle was advanced into the renal collecting system. Ultrasound image documentation was performed. Aspiration of urine sample was performed followed by contrast injection. A transitional dilator was advanced over a guidewire. Percutaneous tract dilatation was then performed over the guidewire. A 10-French percutaneous nephrostomy tube was then advanced and formed in the collecting system. Catheter position was confirmed by fluoroscopy after contrast injection. The catheter was secured at the skin with a Prolene retention suture and Stat-Lock device. A gravity bag was placed. COMPLICATIONS: None. FINDINGS: Ultrasound demonstrates hydronephrosis. After puncture of the lower pole collecting system, there was return of clear but foul-smelling urine. 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 After nephrostomy tube placement, the tube could not be completely formed in the renal pelvis due to the  duplicated nature of the collecting system and small size of the renal pelvis. There was some acute bleeding after nephrostomy tube placement with bloody urine return after tube placement. A urine sample was sent for  culture analysis. IMPRESSION: Placement of 10 French left percutaneous nephrostomy tube. The tube was attached to gravity bag drainage. Initial urine output is bloody after tube placement. A urine sample was sent for culture analysis. Electronically Signed   By: Aletta Edouard M.D.   On: 10/29/2018 19:23   Vas [place] Lower Extremity Venous (dvt)  Result Date: 10/31/2018  Lower Venous Study Indications: Elevated d-dimer.  Limitations: Body habitus. Comparison Study: No prior study. Performing Technologist: Maudry Mayhew MHA, RDMS, RVT, RDCS  Examination Guidelines: A complete evaluation includes B-mode imaging, spectral Doppler, color Doppler, and power Doppler as needed of all accessible portions of each vessel. Bilateral testing is considered an integral part of a complete examination. Limited examinations for reoccurring indications may be performed as noted.  +---------+---------------+---------+-----------+----------+--------------+  RIGHT     Compressibility Phasicity Spontaneity Properties Thrombus Aging  +---------+---------------+---------+-----------+----------+--------------+  CFV       Full            Yes       Yes                                    +---------+---------------+---------+-----------+----------+--------------+  SFJ       Full                                                             +---------+---------------+---------+-----------+----------+--------------+  FV Prox   Full                                                             +---------+---------------+---------+-----------+----------+--------------+  FV Mid    Full                                                             +---------+---------------+---------+-----------+----------+--------------+  FV Distal Full                                                             +---------+---------------+---------+-----------+----------+--------------+  PFV       Full                                                              +---------+---------------+---------+-----------+----------+--------------+  POP       Full  Yes       Yes                                    +---------+---------------+---------+-----------+----------+--------------+  PTV       Full                                                             +---------+---------------+---------+-----------+----------+--------------+  PERO      Full                                                             +---------+---------------+---------+-----------+----------+--------------+   +---------+---------------+---------+-----------+----------+--------------+  LEFT      Compressibility Phasicity Spontaneity Properties Thrombus Aging  +---------+---------------+---------+-----------+----------+--------------+  CFV       Full            Yes       Yes                                    +---------+---------------+---------+-----------+----------+--------------+  SFJ       Full                                                             +---------+---------------+---------+-----------+----------+--------------+  FV Prox   Full                                                             +---------+---------------+---------+-----------+----------+--------------+  FV Mid    Full                                                             +---------+---------------+---------+-----------+----------+--------------+  FV Distal Full                                                             +---------+---------------+---------+-----------+----------+--------------+  PFV       Full                                                             +---------+---------------+---------+-----------+----------+--------------+  POP       Full            Yes       Yes                                    +---------+---------------+---------+-----------+----------+--------------+  PTV       Full                                                              +---------+---------------+---------+-----------+----------+--------------+  PERO      Full                                                             +---------+---------------+---------+-----------+----------+--------------+  Summary: Right: There is no evidence of deep vein thrombosis in the lower extremity. No cystic structure found in the popliteal fossa. Left: There is no evidence of deep vein thrombosis in the lower extremity. No cystic structure found in the popliteal fossa.  *See table(s) above for measurements and observations. Electronically signed by Deitra Mayo MD on 10/31/2018 at 4:34:24 PM.    Final     Labs:  CBC: Recent Labs    10/30/18 1640 10/30/18 2254 10/31/18 0637 11/01/18 0111  WBC 9.0 10.0 10.1 9.2  HGB 11.4* 11.9* 12.1 12.7  HCT 32.8* 34.4* 36.5 38.2  PLT 36* 41* 53* 68*    COAGS: Recent Labs    10/29/18 1443  INR 1.4*  APTT 29    BMP: Recent Labs    10/30/18 0017 10/30/18 1042 10/31/18 1023 11/01/18 0111  NA 137 136 136 137  K 3.1* 4.6 3.8 3.7  CL 108 110 110 108  CO2 19* 20* 20* 20*  GLUCOSE 107* 109* 111* 97  BUN 18 14 11 12   CALCIUM 7.1* 7.4* 7.9* 8.0*  CREATININE 1.56* 1.18* 1.00 1.21*  GFRNONAA 40* 56* >60 54*  GFRAA 46* >60 >60 >60    LIVER FUNCTION TESTS: Recent Labs    05/24/18 1413 10/27/18 0750 10/29/18 1443 10/30/18 0017 11/01/18 0111  BILITOT 0.3 0.8 1.2 1.8*  --   AST 18 18 25 26   --   ALT 13 15 20 18   --   ALKPHOS 63 49 71 117  --   PROT 7.9 7.2 6.1* 5.2*  --   ALBUMIN 3.8 3.8 2.8* 2.1* 1.9*    Assessment and Plan: Urosepsis in the setting of nephrolithiasis s/p left PCN placement 9/27 by Dr. Kathlene Cote.  Patient with pain during visit. Tearful due to lack of progress and overall relief of pain.  RN is assisting with pain management. Bloody output from catheter.   Remains tachycardic with intermittent fevers.   Discussed CT results with Dr. Earleen Newport who notes trace amount of hemorrhage, no remaining  hydronephrosis, stone still present.   If no improvement soon, could consider drain exchange (possibly Bettey Mare per Dr. Earleen Newport).  Cancel flushes as these are not tolerable.  IR following.   Electronically Signed: Docia Barrier, PA 11/01/2018, 5:08 PM   I spent a  total of 15 Minutes at the the patient's bedside AND on the patient's hospital floor or unit, greater than 50% of which was counseling/coordinating care for urosepsis.

## 2018-11-01 NOTE — Evaluation (Signed)
Physical Therapy Evaluation Patient Details Name: Tamara Watson MRN: [id number] DOB: [birth year] Today's Date: 11/01/2018   History of Present Illness  Tamara Watson is a 48 y.o. female presenting with presumed sepsis with lactic acidosis most likely due to ureteral stone . PMH is significant for history of anemia due to menorrhagia and fibroids (s/p partial hysterectomy 06/2018), anxiety, constipation, GERD, and seasonal allergies. pt s/p nephrostomy tube placement by IR on 9/27.  Clinical Impression  Pt admitted with above. Pt in 10/10 L flank pain, c/o "I'm so weak. I feel dizzy" with mobility. Pt requiring minA for ambulation and min guard for transfers due to pain and weakness. 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 Suspect once pain is under control patient will progress quickly. Acute PT to cont to follow.    Follow Up Recommendations No PT follow up;Supervision/Assistance - 24 hour    Equipment Recommendations  None recommended by PT    Recommendations for Other Services       Precautions / Restrictions Precautions Precautions: Fall Restrictions Weight Bearing Restrictions: No      Mobility  Bed Mobility Overal bed mobility: Needs Assistance Bed Mobility: Sit to Supine       Sit to supine: Supervision   General bed mobility comments: pt able to transfer herself into bed s/p ambulation  Transfers Overall transfer level: Needs assistance Equipment used: None Transfers: Sit to/from Stand Sit to Stand: Min guard         General transfer comment: increased time, no physical assist, pt c/o "i feel so weak, I'm dizzy. I'll push through"  Ambulation/Gait Ambulation/Gait assistance: Min guard;Min assist Gait Distance (Feet): 60 Feet(x1, 50x1) Assistive device: IV Pole Gait Pattern/deviations: Step-through pattern;Decreased stride length Gait velocity: slow   General Gait Details: pt c/o "I feel so weak and dizzy" requiring seated rest  break. Pt very slow with decreased gait fluidity and step length & height. VSS, HR 118-125bpm, SpO2 100%, BP 133/84  Stairs            Wheelchair Mobility    Modified Rankin (Stroke Patients Only)       Balance Overall balance assessment: Needs assistance Sitting-balance support: Feet supported;No upper extremity supported Sitting balance-Leahy Scale: Good     Standing balance support: Single extremity supported Standing balance-Leahy Scale: Fair Standing balance comment: pt holding onto counter while washing hands and requiring unilateral support during amb                             Pertinent Vitals/Pain Pain Assessment: 0-10 Pain Score: 10-Worst pain ever Pain Location: L flank Pain Descriptors / Indicators: (throbbing, spasm, constant) Pain Intervention(s): Patient requesting pain meds-RN notified    Home Living Family/patient expects to be discharged to:: Private residence Living Arrangements: Spouse/significant other Available Help at Discharge: Family;Available 24 hours/day Type of Home: House         Home Equipment: None      Prior Function Level of Independence: Independent         Comments: works as a Therapist, 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     [employer]   Dominant Hand: Right    Extremity/Trunk Assessment   Upper Extremity Assessment Upper Extremity Assessment: Generalized weakness    Lower Extremity Assessment Lower Extremity Assessment: Generalized weakness    Cervical / Trunk Assessment Cervical / Trunk Assessment: Other exceptions Cervical / Trunk Exceptions: nephrostomy tube  Communication   Communication:  No difficulties  Cognition Arousal/Alertness: Awake/alert Behavior During Therapy: Flat affect Overall Cognitive Status: Within Functional Limits for tasks assessed                                 General Comments: Pt very frustrated about the pain, pt typically jovial however upset, sad, and frustrated       General Comments General comments (skin integrity, edema, etc.): HR 118-122 at rest and during mobility. SpO2 and BP stable    Exercises     Assessment/Plan    PT Assessment Patient needs continued PT services  PT Problem List Decreased strength;Decreased range of motion;Decreased activity tolerance;Decreased balance;Decreased mobility;Decreased coordination;Decreased cognition       PT Treatment Interventions DME instruction;Gait training;Stair training;Functional mobility training;Therapeutic activities;Therapeutic exercise;Balance training;Neuromuscular re-education;Cognitive remediation    PT Goals (Current goals can be found in the Care Plan section)  Acute Rehab PT Goals Patient Stated Goal: stop the pain PT Goal Formulation: With patient Time For Goal Achievement: 11/15/18 Potential to Achieve Goals: Good    Frequency Min 3X/week   Barriers to discharge        Co-evaluation               AM-PAC PT "6 Clicks" Mobility  Outcome Measure Help needed turning from your back to your side while in a flat bed without using bedrails?: None Help needed moving from lying on your back to sitting on the side of a flat bed without using bedrails?: None Help needed moving to and from a bed to a chair (including a wheelchair)?: A Little Help needed standing up from a chair using your arms (e.g., wheelchair or bedside chair)?: A Little Help needed to walk in hospital room?: A Little Help needed climbing 3-5 steps with a railing? : A Little 6 Click Score: 20    End of Session Equipment Utilized During Treatment: Gait belt Activity Tolerance: Patient tolerated treatment well Patient left: in bed;with call bell/phone within reach;with family/visitor present Nurse Communication: Mobility status PT Visit Diagnosis: Difficulty in walking, not elsewhere classified (R26.2)    Time: 1100-1126 PT Time Calculation (min) (ACUTE ONLY): 26 min   Charges:   PT Evaluation $PT  Eval Moderate Complexity: 1 Mod PT Treatments $Gait Training: 8-22 mins        Kittie Plater, PT, DPT Acute Rehabilitation Services Pager #: [phone] Office #: [phone]   Berline Lopes 11/01/2018, 12:57 PM

## 2018-11-01 NOTE — Progress Notes (Signed)
Pharmacy Heparin Induced Thrombocytopenia (HIT) Note:  Tamara Watson is an 48 y.o. female being evaluated for HIT. Heparin was started 9/28 for VTE prophylaxis, and baseline platelets were 70.   HIT labs were ordered on 9/28 when platelets dropped to 33. Auto-populate labs:  Heparin Induced Plt Ab  Date/Time Value Ref Range Status  10/30/2018 10:42 AM 0.079 0.000 - 0.400 OD Final    Comment:    (NOTE) Performed At: [facility] [place], [place] [id number] Rush Farmer MD [id number]       CALCULATE SCORE:  4Ts (see the HIT Algorithm) Score  Thrombocytopenia 2  Timing 0  Thrombosis 0  Other causes of thrombocytopenia 1  Total 3     Recommendations (A or B or C) are based on available lab results:  A. No lab results available (HIT antibody and/or SRA ordered): -Low HIT probability: consider discontinuing HIT labs, continue heparin/LMWH, SRA not recommended; no heparin allergy documentation needed  B. HIT antibody result available: -Ruled Out HIT: No SRA needed, continue heparin product, no allergy documentation needed  C. SRA result available -SRA not available  Name of MD Contacted: [place] (Discussed with provider) Labs ordered: -Heparin antibody Heparin allergy: -no allergy documentation needed Anticoagulation plans -continue heparin / LMWH as platelet count requires   Comments (List any alternative plans or if there are contraindications to therapy)   Alycia Rossetti, PharmD, BCPS Pager: [phone] 1:15 PM

## 2018-11-01 NOTE — Progress Notes (Deleted)
PHARMACY FOLLOW-UP FOR HIT WORK-UP  Allergy documented: Yes  Heparin products discontinued: Yes  HIT 4T Score: low  Heparin Induced Platelet Antibody (HIT Antibody): completed  Result:  negative Reported Value:  Lab Results  Component Value Date   HEPINDPLTAB 0.079 10/30/2018   Reference range: 0-0.04  Hematology Consult: No  Plan (alternative anticoagulation): SCDs  Lajean Saver, [facility] 11/01/2018 8:39 AM

## 2018-11-01 NOTE — Progress Notes (Signed)
Chaplain visited the patient as a result of daily rounding.  Patient requested prayer while chaplain visited.  Chaplain does sense a need for follow-up.  Brion Aliment Chaplain Resident For questions concerning this note please contact me by pager [phone]

## 2018-11-02 ENCOUNTER — Inpatient Hospital Stay (HOSPITAL_COMMUNITY): Payer: Managed Care, Other (non HMO)

## 2018-11-02 DIAGNOSIS — R509 Fever, unspecified: Secondary | ICD-10-CM

## 2018-11-02 DIAGNOSIS — R5081 Fever presenting with conditions classified elsewhere: Secondary | ICD-10-CM

## 2018-11-02 LAB — RENAL FUNCTION PANEL
Albumin: 1.7 g/dL — ABNORMAL LOW (ref 3.5–5.0)
Anion gap: 7 (ref 5–15)
BUN: 9 mg/dL (ref 6–20)
CO2: 21 mmol/L — ABNORMAL LOW (ref 22–32)
Calcium: 7.4 mg/dL — ABNORMAL LOW (ref 8.9–10.3)
Chloride: 109 mmol/L (ref 98–111)
Creatinine, Ser: 1.22 mg/dL — ABNORMAL HIGH (ref 0.44–1.00)
GFR calc Af Amer: >60 mL/min (ref >60)
GFR calc non Af Amer: 53 mL/min — ABNORMAL LOW (ref >60)
Glucose, Bld: 110 mg/dL — ABNORMAL HIGH (ref 70–99)
Phosphorus: 2.4 mg/dL — ABNORMAL LOW (ref 2.5–4.6)
Potassium: 3.3 mmol/L — ABNORMAL LOW (ref 3.5–5.1)
Sodium: 137 mmol/L (ref 135–145)

## 2018-11-02 LAB — CBC
HCT: 33.1 % — ABNORMAL LOW (ref 36.0–46.0)
Hemoglobin: 11.1 g/dL — ABNORMAL LOW (ref 12.0–15.0)
MCH: 29.4 pg (ref 26.0–34.0)
MCHC: 33.5 g/dL (ref 30.0–36.0)
MCV: 87.8 fL (ref 80.0–100.0)
Platelets: 118 10*3/uL — ABNORMAL LOW (ref 150–400)
RBC: 3.77 MIL/uL — ABNORMAL LOW (ref 3.87–5.11)
RDW: 14.2 % (ref 11.5–15.5)
WBC: 10.8 10*3/uL — ABNORMAL HIGH (ref 4.0–10.5)
nRBC: 0.2 % (ref 0.0–0.2)

## 2018-11-02 MED ORDER — AMOXICILLIN-POT CLAVULANATE 875-125 MG PO TABS
1.0000 | ORAL_TABLET | Freq: Two times a day (BID) | ORAL | Status: DC
  Filled 2018-11-02: qty 1

## 2018-11-02 MED ORDER — MELOXICAM 7.5 MG PO TABS: 7.5000 mg | ORAL_TABLET | Freq: Every day | ORAL | Status: DC

## 2018-11-02 MED ORDER — OXYCODONE-ACETAMINOPHEN 5-325 MG PO TABS: 1.0000 | ORAL_TABLET | ORAL | Status: DC | PRN

## 2018-11-02 MED ORDER — HYDROMORPHONE HCL 1 MG/ML IJ SOLN
1.0000 mg | INTRAMUSCULAR | Status: DC | PRN
  Administered 2018-11-02 – 2018-11-03 (×4): 1 mg via INTRAVENOUS
  Filled 2018-11-02 (×4): qty 1

## 2018-11-02 MED ORDER — ACETAMINOPHEN 500 MG PO TABS
1000.0000 mg | ORAL_TABLET | Freq: Four times a day (QID) | ORAL | Status: DC
  Administered 2018-11-02 – 2018-11-09 (×23): 1000 mg via ORAL
  Filled 2018-11-02 (×29): qty 2

## 2018-11-02 MED ORDER — CEFAZOLIN SODIUM-DEXTROSE 2-4 GM/100ML-% IV SOLN
2.0000 g | Freq: Three times a day (TID) | INTRAVENOUS | Status: DC
  Administered 2018-11-02: 14:00:00 2 g via INTRAVENOUS
  Filled 2018-11-02 (×3): qty 100

## 2018-11-02 MED ORDER — SODIUM CHLORIDE 0.9 % IV SOLN
2.0000 g | INTRAVENOUS | Status: DC
  Administered 2018-11-02 – 2018-11-08 (×7): 2 g via INTRAVENOUS
  Filled 2018-11-02: qty 20
  Filled 2018-11-02 (×7): qty 2

## 2018-11-02 MED ORDER — IBUPROFEN 200 MG PO TABS: 200.0000 mg | ORAL_TABLET | ORAL | Status: DC | PRN

## 2018-11-02 MED ORDER — SODIUM CHLORIDE 0.9 % IV SOLN
2.0000 g | Freq: Two times a day (BID) | INTRAVENOUS | Status: DC
  Filled 2018-11-02: qty 2

## 2018-11-02 MED ORDER — POTASSIUM CHLORIDE CRYS ER 20 MEQ PO TBCR
30.0000 meq | EXTENDED_RELEASE_TABLET | Freq: Once | ORAL | Status: AC
  Administered 2018-11-02: 30 meq via ORAL
  Filled 2018-11-02: qty 1

## 2018-11-02 MED ORDER — SODIUM CHLORIDE 0.9 % IV SOLN: 1.0000 g | Freq: Two times a day (BID) | INTRAVENOUS | Status: DC

## 2018-11-02 NOTE — Progress Notes (Signed)
Subjective: Patient reports worsening left flank pain that is poorly controlled on current pain control regiment. Her fevers are getting worse over the past 24 hours. She is currently on cefazolin. Urine culture grew klebsiella and blood culture showed evidence of klebsiella and enterobacter.   Objective: Vital signs in last 24 hours: Temp:  [98.5 F (36.9 C)-102.1 F (38.9 C)] 99.2 F (37.3 C) (10/01 1927) Pulse Rate:  [95-109] 109 (10/01 1927) Resp:  [16-18] 16 (10/01 1927) BP: (105-156)/(63-94) 156/77 (10/01 1927) SpO2:  [96 %-100 %] 96 % (10/01 1927)  Intake/Output from previous day: 09/30 0701 - 10/01 0700 In: 2405.2 [I.V.:2000.2; IV Piggyback:400] Out: 1750 [Urine:1750] Intake/Output this shift: Total I/O In: -  Out: 380 [Urine:380]  Physical Exam:  General:alert, cooperative and appears stated age GI: soft, non tender, normal bowel sounds, no palpable masses, no organomegaly, no inguinal hernia Female genitalia: not done Extremities: extremities normal, atraumatic, no cyanosis or edema  Lab Results: Recent Labs    10/31/18 0637 11/01/18 0111 11/02/18 0412  HGB 12.1 12.7 11.1*  HCT 36.5 38.2 33.1*   BMET Recent Labs    11/01/18 0111 11/02/18 0412  NA 137 137  K 3.7 3.3*  CL 108 109  CO2 20* 21*  GLUCOSE 97 110*  BUN 12 9  CREATININE 1.21* 1.22*  CALCIUM 8.0* 7.4*   No results for input(s): LABPT, INR in the last 72 hours. No results for input(s): LABURIN in the last 72 hours. Results for orders placed or performed during the hospital encounter of 10/29/18  SARS Coronavirus 2 [facility] order, Performed in [facility] hospital lab) Nasopharyngeal Nasopharyngeal Swab     Status: None   Collection Time: 10/29/18  2:09 PM   Specimen: Nasopharyngeal Swab  Result Value Ref Range Status   SARS Coronavirus 2 NEGATIVE NEGATIVE Final    Comment: (NOTE) If result is NEGATIVE SARS-CoV-2 target nucleic acids are NOT DETECTED. The SARS-CoV-2 RNA is generally  detectable in upper and lower  respiratory specimens during the acute phase of infection. The lowest  concentration of SARS-CoV-2 viral copies this assay can detect is 250  copies / mL. A negative result does not preclude SARS-CoV-2 infection  and should not be used as the sole basis for treatment or other  patient management decisions.  A negative result may occur with  improper specimen collection / handling, submission of specimen other  than nasopharyngeal swab, presence of viral mutation(s) within the  areas targeted by this assay, and inadequate number of viral copies  (<250 copies / mL). A negative result must be combined with clinical  observations, patient history, and epidemiological information. If result is POSITIVE SARS-CoV-2 target nucleic acids are DETECTED. The SARS-CoV-2 RNA is generally detectable in upper and lower  respiratory specimens dur ing the acute phase of infection.  Positive  results are indicative of active infection with SARS-CoV-2.  Clinical  correlation with patient history and other diagnostic information is  necessary to determine patient infection status.  Positive results do  not rule out bacterial infection or co-infection with other viruses. If result is PRESUMPTIVE POSTIVE SARS-CoV-2 nucleic acids MAY BE PRESENT.   A presumptive positive result was obtained on the submitted specimen  and confirmed on repeat testing.  While 2019 novel coronavirus  (SARS-CoV-2) nucleic acids may be present in the submitted sample  additional confirmatory testing may be necessary for epidemiological  and / or clinical management purposes  to differentiate between  SARS-CoV-2 and other Sarbecovirus currently known to infect  humans.  If clinically indicated additional testing with an alternate test  methodology [phone]) is advised. The SARS-CoV-2 RNA is generally  detectable in upper and lower respiratory sp ecimens during the acute  phase of infection. The  expected result is Negative. Fact Sheet for Patients:  StrictlyIdeas.no Fact Sheet for Healthcare Providers: BankingDealers.co.za This test is not yet approved or cleared by the [place] FDA and has been authorized for detection and/or diagnosis of SARS-CoV-2 by FDA under an Emergency Use Authorization (EUA).  This EUA will remain in effect (meaning this test can be used) for the duration of the COVID-19 declaration under Section 564(b)(1) of the Act, 21 U.S.C. section 360bbb-3(b)(1), unless the authorization is terminated or revoked sooner. Performed at [facility], [place] [address] [place], [place] [address]   Urine culture     Status: Abnormal   Collection Time: 10/29/18  2:21 PM   Specimen: In/Out Cath Urine  Result Value Ref Range Status   Specimen Description IN/OUT CATH URINE  Final   Special Requests   Final    NONE Performed at [facility], [place] [address], [place], [place] [address]    Culture 50,000 COLONIES/mL KLEBSIELLA PNEUMONIAE (A)  Final   Report Status 10/31/2018 FINAL  Final   Organism ID, Bacteria KLEBSIELLA PNEUMONIAE (A)  Final      Susceptibility   Klebsiella pneumoniae - MIC*    AMPICILLIN >=32 RESISTANT Resistant     CEFAZOLIN <=4 SENSITIVE Sensitive     CEFTRIAXONE <=1 SENSITIVE Sensitive     CIPROFLOXACIN <=0.25 SENSITIVE Sensitive     GENTAMICIN <=1 SENSITIVE Sensitive     IMIPENEM 1 SENSITIVE Sensitive     NITROFURANTOIN 128 RESISTANT Resistant     TRIMETH/SULFA <=20 SENSITIVE Sensitive     AMPICILLIN/SULBACTAM 4 SENSITIVE Sensitive     PIP/TAZO <=4 SENSITIVE Sensitive     Extended ESBL NEGATIVE Sensitive     * 50,000 COLONIES/mL KLEBSIELLA PNEUMONIAE  Blood Culture (routine x 2)     Status: Abnormal   Collection Time: 10/29/18  2:26 PM   Specimen: BLOOD  Result Value Ref Range Status   Specimen Description BLOOD RIGHT ANTECUBITAL  Final   Special Requests   Final    BOTTLES DRAWN  AEROBIC AND ANAEROBIC Blood Culture adequate volume   Culture  Setup Time   Final    GRAM NEGATIVE RODS IN BOTH AEROBIC AND ANAEROBIC BOTTLES CRITICAL RESULT CALLED TO, READ BACK BY AND VERIFIED WITH: T. EGAN,PHARMD 0423 10/30/2018 T. TYSOR    Culture (A)  Final    KLEBSIELLA PNEUMONIAE SUSCEPTIBILITIES PERFORMED ON PREVIOUS CULTURE WITHIN THE LAST 5 DAYS. Performed at [facility], [place] [address], [place], [place] [address]    Report Status 11/01/2018 FINAL  Final  Blood Culture (routine x 2)     Status: Abnormal   Collection Time: 10/29/18  3:23 PM   Specimen: BLOOD  Result Value Ref Range Status   Specimen Description BLOOD LEFT ANTECUBITAL  Final   Special Requests   Final    BOTTLES DRAWN AEROBIC AND ANAEROBIC Blood Culture results may not be optimal due to an inadequate volume of blood received in culture bottles   Culture  Setup Time   Final    GRAM NEGATIVE RODS IN BOTH AEROBIC AND ANAEROBIC BOTTLES CRITICAL RESULT CALLED TO, READ BACK BY AND VERIFIED WITH: TDonzetta Kohut [id number] 10/30/2018 Mena Goes Performed at [facility], [place] [address], [place],  [address]  Culture KLEBSIELLA PNEUMONIAE (A)  Final   Report Status 11/01/2018 FINAL  Final   Organism ID, Bacteria KLEBSIELLA PNEUMONIAE  Final      Susceptibility   Klebsiella pneumoniae - MIC*    AMPICILLIN >=32 RESISTANT Resistant     CEFAZOLIN <=4 SENSITIVE Sensitive     CEFEPIME <=1 SENSITIVE Sensitive     CEFTAZIDIME <=1 SENSITIVE Sensitive     CEFTRIAXONE <=1 SENSITIVE Sensitive     CIPROFLOXACIN <=0.25 SENSITIVE Sensitive     GENTAMICIN <=1 SENSITIVE Sensitive     IMIPENEM 0.5 SENSITIVE Sensitive     TRIMETH/SULFA <=20 SENSITIVE Sensitive     AMPICILLIN/SULBACTAM 4 SENSITIVE Sensitive     PIP/TAZO <=4 SENSITIVE Sensitive     Extended ESBL NEGATIVE Sensitive     * KLEBSIELLA PNEUMONIAE  Blood Culture ID Panel (Reflexed)     Status: Abnormal   Collection Time: 10/29/18  3:23 PM  Result Value  Ref Range Status   Enterococcus species NOT DETECTED NOT DETECTED Final   Listeria monocytogenes NOT DETECTED NOT DETECTED Final   Staphylococcus species NOT DETECTED NOT DETECTED Final   Staphylococcus aureus (BCID) NOT DETECTED NOT DETECTED Final   Streptococcus species NOT DETECTED NOT DETECTED Final   Streptococcus agalactiae NOT DETECTED NOT DETECTED Final   Streptococcus pneumoniae NOT DETECTED NOT DETECTED Final   Streptococcus pyogenes NOT DETECTED NOT DETECTED Final   Acinetobacter baumannii NOT DETECTED NOT DETECTED Final   Enterobacteriaceae species DETECTED (A) NOT DETECTED Final    Comment: Enterobacteriaceae represent a large family of gram-negative bacteria, not a single organism. CRITICAL RESULT CALLED TO, READ BACK BY AND VERIFIED WITH: T. EGAN,PHARMD 0423 10/30/2018 T. TYSOR    Enterobacter cloacae complex NOT DETECTED NOT DETECTED Final   Escherichia coli NOT DETECTED NOT DETECTED Final   Klebsiella oxytoca NOT DETECTED NOT DETECTED Final   Klebsiella pneumoniae DETECTED (A) NOT DETECTED Final    Comment: CRITICAL RESULT CALLED TO, READ BACK BY AND VERIFIED WITH: T. EGAN,PHARMD 0423 10/30/2018 T. TYSOR    Proteus species NOT DETECTED NOT DETECTED Final   Serratia marcescens NOT DETECTED NOT DETECTED Final   Carbapenem resistance NOT DETECTED NOT DETECTED Final   Haemophilus influenzae NOT DETECTED NOT DETECTED Final   Neisseria meningitidis NOT DETECTED NOT DETECTED Final   Pseudomonas aeruginosa NOT DETECTED NOT DETECTED Final   Candida albicans NOT DETECTED NOT DETECTED Final   Candida glabrata NOT DETECTED NOT DETECTED Final   Candida krusei NOT DETECTED NOT DETECTED Final   Candida parapsilosis NOT DETECTED NOT DETECTED Final   Candida tropicalis NOT DETECTED NOT DETECTED Final    Comment: Performed at [facility], [place]. [address], [place], [place] [address]  Urine Culture     Status: Abnormal   Collection Time: 10/29/18  7:09 PM   Specimen: Kidney,  Left; Urine  Result Value Ref Range Status   Specimen Description KIDNEY LEFT URINE  Final   Special Requests   Final    A Performed at [facility], [address], [place],  [address]    Culture >=100,000 COLONIES/mL KLEBSIELLA PNEUMONIAE (A)  Final   Report Status 10/31/2018 FINAL  Final   Organism ID, Bacteria KLEBSIELLA PNEUMONIAE (A)  Final      Susceptibility   Klebsiella pneumoniae - MIC*    AMPICILLIN >=32 RESISTANT Resistant     CEFAZOLIN <=4 SENSITIVE Sensitive     CEFEPIME <=1 SENSITIVE Sensitive     CEFTAZIDIME <=1 SENSITIVE Sensitive  CEFTRIAXONE <=1 SENSITIVE Sensitive     CIPROFLOXACIN <=0.25 SENSITIVE Sensitive     GENTAMICIN <=1 SENSITIVE Sensitive     IMIPENEM 0.5 SENSITIVE Sensitive     TRIMETH/SULFA <=20 SENSITIVE Sensitive     AMPICILLIN/SULBACTAM 4 SENSITIVE Sensitive     PIP/TAZO <=4 SENSITIVE Sensitive     Extended ESBL NEGATIVE Sensitive     * >=100,000 COLONIES/mL KLEBSIELLA PNEUMONIAE  MRSA PCR Screening     Status: None   Collection Time: 10/30/18  8:30 AM   Specimen: Nasopharyngeal  Result Value Ref Range Status   MRSA by PCR NEGATIVE NEGATIVE Final    Comment:        The GeneXpert MRSA Assay (FDA approved for NASAL specimens only), is one component of a comprehensive MRSA colonization surveillance program. It is not intended to diagnose MRSA infection nor to guide or monitor treatment for MRSA infections. Performed at [facility], [place] [address], [place], [place] [address]     Studies/Results: Dg Chest Port 1 View  Result Date: 11/02/2018 CLINICAL DATA:  New onset fevers EXAM: PORTABLE CHEST 1 VIEW COMPARISON:  10/29/2018 FINDINGS: Cardiac shadow is prominent accentuated by the portable technique. Increasing density in the left base is noted likely related to underlying atelectasis. No other focal abnormality is seen. IMPRESSION: Increasing parenchymal opacity in the left base consistent with increasing atelectasis.  Electronically Signed   By: Inez Catalina M.D.   On: 11/02/2018 12:31    Assessment/Plan: 47yo with left ureteral calculus, sepsis from a urinary source 1. Pain control regiment will be adjusted to dilaudid and percocet for better pain control. 2. Pyelonephritis and sepsis: Antibiotic changed to ceftriaxone for better Gram negative coverage    LOS: 4 days   Tamara Watson 11/02/2018, 8:47 PM

## 2018-11-02 NOTE — Progress Notes (Signed)
   11/02/18 1523  MEWS Score  ECG Heart Rate (!) 106  Pulse Rate (!) 104  Temp (!) 102.1 F (38.9 C)  SpO2 98 %  O2 Device Room Air   Pt recent temp noted above. HR sustaining between 100-110s. She continues to have chills. Scheduled Tylenol given. RN notified MD. Will continue to monitor.

## 2018-11-02 NOTE — Progress Notes (Addendum)
Family Medicine Teaching Service Daily Progress Note Intern Pager: [phone]  Patient name: Tamara Watson Medical record number: [id number] Date of birth: 28-Nov-1970 Age: 48 y.o. Gender: female  Primary Care Provider: [name], [name] Him, MD Consultants: Urology, IR, CCM Code Status: Full Code   Assessment and Plan:  Antibiotics: Vancomycin 9/27 Cefepime 9/27 Ceftriaxone 9/28-9/29 Cefazolin 9/29-  Assessment and Plan: Tamara D Tom-Johnsonis a 48 y.o.femalepresenting with presumed sepsis with lactic acidosis most likely due to ureteral stone. PMH is significant for history of anemia due to menorrhagiaand fibroids (s/p partialhysterectomy5/2020), anxiety, constipation, GERD, and seasonal allergies.  Sepsis due to Klebsiella bacteremia secondary to obstructing nephrolithiasis  S/p Left Nephrostomy Tube placement, improving Fever 100.5 Patient reports that she feels somewhat improved but continues to have intermittent chills.  Patient's vitals continue to remain stable however does have persistent mild tachycardia to 110's. Patient spiked fever during of 100.5 after being given Tylenol. Discussed this case with infectious disease and recommended to continue with  patient's current antimicrobial regimen includes Cefazolin (Day 5 of 14 day course of antimicrobial therapy).  Also spoke with Dr. Junious Silk from urology concerning this new fever.  Was informed that it is common to see this type of fever pattern in pyelonephritis, encouraged to continue to monitor, flush nephrostomy tube if concern for blood-tinged output, continue to monitor nephrostomy tube output, and encouraged to gain KUB to confirm nephrostomy tube placement.  UOP 1.750 L overnight Urine from nephrostomy tube blood tinged. Creatinine stable 1.00>1.21 at baseline. Electrolytes stable. Patient required Morphine 2 mg and ketorolac 15mg  overnight -CXR with atelectasis - IR following for nephrostomy care -  continue Cefazolin 2g q8 hours (Day 5 of 14 day total antibiotic course) - continue scheduled Tylenol  -continue with 5th dose of Toradol, will discontinue following this administration   - Regular diet, monitor p.o. intake -Continue fluids, 125mL/hr NS -Continue Miralax and Senna as needed for narcotic induced constipation -Continue Flomax  AKI, resolved Creatinine 1.59 on admission. Stable at 1.22 this AM.Currently on NS infusion at 100 mL/h.  -Continue to monitor Daily BMP -continue fluids -Toradol 4/5 doses completed, will discontinue after fifth dose   Thrombocytopenia, improving Platelets up trending from 68 to 118 this AM.  -Continue to follow with daily CBC  Anemia, Resolved.  Likely due to nephrostomy tube placement.  Hemoglobin 15.1 on admission, 11.1 this AM.  -continue daily CBC  Tachycardia HR in the 110s overnight, has been elevated since admission. Likely due to pain.  -consider CTA if tachycardia remains once fever and acute infection resolves   FEN/GI: regular diet PPx: protonix  Disposition: continued medical workup, transfer to floor   Subjective:  Patient reports improvement in overall status.  Patient states that pain is improved and that she continues to have chills.    Objective: Temp:  [98.3 F (36.8 C)-100.5 F (38.1 C)] 100.5 F (38.1 C) (10/01 1119) Pulse Rate:  [95-102] 100 (10/01 1119) Resp:  [18] 18 (10/01 0748) BP: (105-127)/(63-94) 105/94 (10/01 0748) SpO2:  [98 %-100 %] 98 % (10/01 1119)  Physical Exam: General: Female lying in bed on right side in no acute distress Cardiovascular: Regular rate and rhythm without murmurs, gallops or friction rubs Respiratory: Clear to auscultation bilaterally without wheezing or crackles Abdomen: Soft, nontender, minimal bowel sounds appreciated Extremities: no lower extremity edema Back: Left Nephrostomy tube in place and draining blood tinged fluid, no erythema or warmth surrounding site of  insertion of the tubing, patient exhibits tenderness in this area  Laboratory: Recent Labs  Lab 10/31/18 0637 11/01/18 0111 11/02/18 0412  WBC 10.1 9.2 10.8*  HGB 12.1 12.7 11.1*  HCT 36.5 38.2 33.1*  PLT 53* 68* 118*   Recent Labs  Lab 10/27/18 0750  10/29/18 1443 10/30/18 0017  10/31/18 1023 11/01/18 0111 11/02/18 0412  NA 139   < > 132* 137   < > 136 137 137  K 3.6   < > 3.1* 3.1*   < > 3.8 3.7 3.3*  CL 105   < > 100 108   < > 110 108 109  CO2 25   < > 18* 19*   < > 20* 20* 21*  BUN 12   < > 18 18   < > 11 12 9   CREATININE 0.90   < > 1.53* 1.56*   < > 1.00 1.21* 1.22*  CALCIUM 8.8*   < > 7.9* 7.1*   < > 7.9* 8.0* 7.4*  PROT 7.2  --  6.1* 5.2*  --   --   --   --   BILITOT 0.8  --  1.2 1.8*  --   --   --   --   ALKPHOS 49  --  71 117  --   --   --   --   ALT 15  --  20 18  --   --   --   --   AST 18  --  25 26  --   --   --   --   GLUCOSE 135*   < > 117* 107*   < > 111* 97 110*   < > = values in this interval not displayed.   Imaging/Diagnostic Tests: CXR with increasing atelectasis in left lung base  Stark Klein, MD 11/02/2018, 1:50 PM PGY-1, [place] Intern pager: [phone], text pages welcome

## 2018-11-02 NOTE — H&P (View-Only) (Signed)
Subjective: Patient reports worsening left flank pain that is poorly controlled on current pain control regiment. Her fevers are getting worse over the past 24 hours. She is currently on cefazolin. Urine culture grew klebsiella and blood culture showed evidence of klebsiella and enterobacter.   Objective: Vital signs in last 24 hours: Temp:  [98.5 F (36.9 C)-102.1 F (38.9 C)] 99.2 F (37.3 C) (10/01 1927) Pulse Rate:  [95-109] 109 (10/01 1927) Resp:  [16-18] 16 (10/01 1927) BP: (105-156)/(63-94) 156/77 (10/01 1927) SpO2:  [96 %-100 %] 96 % (10/01 1927)  Intake/Output from previous day: 09/30 0701 - 10/01 0700 In: 2405.2 [I.V.:2000.2; IV Piggyback:400] Out: 1750 [Urine:1750] Intake/Output this shift: Total I/O In: -  Out: 380 [Urine:380]  Physical Exam:  General:alert, cooperative and appears stated age GI: soft, non tender, normal bowel sounds, no palpable masses, no organomegaly, no inguinal hernia Female genitalia: not done Extremities: extremities normal, atraumatic, no cyanosis or edema  Lab Results: Recent Labs    10/31/18 0637 11/01/18 0111 11/02/18 0412  HGB 12.1 12.7 11.1*  HCT 36.5 38.2 33.1*   BMET Recent Labs    11/01/18 0111 11/02/18 0412  NA 137 137  K 3.7 3.3*  CL 108 109  CO2 20* 21*  GLUCOSE 97 110*  BUN 12 9  CREATININE 1.21* 1.22*  CALCIUM 8.0* 7.4*   No results for input(s): LABPT, INR in the last 72 hours. No results for input(s): LABURIN in the last 72 hours. Results for orders placed or performed during the hospital encounter of 10/29/18  SARS Coronavirus 2 [facility] order, Performed in [facility] hospital lab) Nasopharyngeal Nasopharyngeal Swab     Status: None   Collection Time: 10/29/18  2:09 PM   Specimen: Nasopharyngeal Swab  Result Value Ref Range Status   SARS Coronavirus 2 NEGATIVE NEGATIVE Final    Comment: (NOTE) If result is NEGATIVE SARS-CoV-2 target nucleic acids are NOT DETECTED. The SARS-CoV-2 RNA is generally  detectable in upper and lower  respiratory specimens during the acute phase of infection. The lowest  concentration of SARS-CoV-2 viral copies this assay can detect is 250  copies / mL. A negative result does not preclude SARS-CoV-2 infection  and should not be used as the sole basis for treatment or other  patient management decisions.  A negative result may occur with  improper specimen collection / handling, submission of specimen other  than nasopharyngeal swab, presence of viral mutation(s) within the  areas targeted by this assay, and inadequate number of viral copies  (<250 copies / mL). A negative result must be combined with clinical  observations, patient history, and epidemiological information. If result is POSITIVE SARS-CoV-2 target nucleic acids are DETECTED. The SARS-CoV-2 RNA is generally detectable in upper and lower  respiratory specimens dur ing the acute phase of infection.  Positive  results are indicative of active infection with SARS-CoV-2.  Clinical  correlation with patient history and other diagnostic information is  necessary to determine patient infection status.  Positive results do  not rule out bacterial infection or co-infection with other viruses. If result is PRESUMPTIVE POSTIVE SARS-CoV-2 nucleic acids MAY BE PRESENT.   A presumptive positive result was obtained on the submitted specimen  and confirmed on repeat testing.  While 2019 novel coronavirus  (SARS-CoV-2) nucleic acids may be present in the submitted sample  additional confirmatory testing may be necessary for epidemiological  and / or clinical management purposes  to differentiate between  SARS-CoV-2 and other Sarbecovirus currently known to infect  humans.  If clinically indicated additional testing with an alternate test  methodology [phone]) is advised. The SARS-CoV-2 RNA is generally  detectable in upper and lower respiratory sp ecimens during the acute  phase of infection. The  expected result is Negative. Fact Sheet for Patients:  StrictlyIdeas.no Fact Sheet for Healthcare Providers: BankingDealers.co.za This test is not yet approved or cleared by the [place] FDA and has been authorized for detection and/or diagnosis of SARS-CoV-2 by FDA under an Emergency Use Authorization (EUA).  This EUA will remain in effect (meaning this test can be used) for the duration of the COVID-19 declaration under Section 564(b)(1) of the Act, 21 U.S.C. section 360bbb-3(b)(1), unless the authorization is terminated or revoked sooner. Performed at [facility], [place] [address], [place], [place] [address]   Urine culture     Status: Abnormal   Collection Time: 10/29/18  2:21 PM   Specimen: In/Out Cath Urine  Result Value Ref Range Status   Specimen Description IN/OUT CATH URINE  Final   Special Requests   Final    NONE Performed at [facility], [place] [address], [place], [place] [address]    Culture 50,000 COLONIES/mL KLEBSIELLA PNEUMONIAE (A)  Final   Report Status 10/31/2018 FINAL  Final   Organism ID, Bacteria KLEBSIELLA PNEUMONIAE (A)  Final      Susceptibility   Klebsiella pneumoniae - MIC*    AMPICILLIN >=32 RESISTANT Resistant     CEFAZOLIN <=4 SENSITIVE Sensitive     CEFTRIAXONE <=1 SENSITIVE Sensitive     CIPROFLOXACIN <=0.25 SENSITIVE Sensitive     GENTAMICIN <=1 SENSITIVE Sensitive     IMIPENEM 1 SENSITIVE Sensitive     NITROFURANTOIN 128 RESISTANT Resistant     TRIMETH/SULFA <=20 SENSITIVE Sensitive     AMPICILLIN/SULBACTAM 4 SENSITIVE Sensitive     PIP/TAZO <=4 SENSITIVE Sensitive     Extended ESBL NEGATIVE Sensitive     * 50,000 COLONIES/mL KLEBSIELLA PNEUMONIAE  Blood Culture (routine x 2)     Status: Abnormal   Collection Time: 10/29/18  2:26 PM   Specimen: BLOOD  Result Value Ref Range Status   Specimen Description BLOOD RIGHT ANTECUBITAL  Final   Special Requests   Final    BOTTLES DRAWN  AEROBIC AND ANAEROBIC Blood Culture adequate volume   Culture  Setup Time   Final    GRAM NEGATIVE RODS IN BOTH AEROBIC AND ANAEROBIC BOTTLES CRITICAL RESULT CALLED TO, READ BACK BY AND VERIFIED WITH: T. EGAN,PHARMD 0423 10/30/2018 T. TYSOR    Culture (A)  Final    KLEBSIELLA PNEUMONIAE SUSCEPTIBILITIES PERFORMED ON PREVIOUS CULTURE WITHIN THE LAST 5 DAYS. Performed at [facility], [place] [address] [place], [place] [address]    Report Status 11/01/2018 FINAL  Final  Blood Culture (routine x 2)     Status: Abnormal   Collection Time: 10/29/18  3:23 PM   Specimen: BLOOD  Result Value Ref Range Status   Specimen Description BLOOD LEFT ANTECUBITAL  Final   Special Requests   Final    BOTTLES DRAWN AEROBIC AND ANAEROBIC Blood Culture results may not be optimal due to an inadequate volume of blood received in culture bottles   Culture  Setup Time   Final    GRAM NEGATIVE RODS IN BOTH AEROBIC AND ANAEROBIC BOTTLES CRITICAL RESULT CALLED TO, READ BACK BY AND VERIFIED WITH: TDonzetta Kohut [id number] 10/30/2018 Mena Goes Performed at [facility], [place] [address], [place], [place] [address]  Culture KLEBSIELLA PNEUMONIAE (A)  Final   Report Status 11/01/2018 FINAL  Final   Organism ID, Bacteria KLEBSIELLA PNEUMONIAE  Final      Susceptibility   Klebsiella pneumoniae - MIC*    AMPICILLIN >=32 RESISTANT Resistant     CEFAZOLIN <=4 SENSITIVE Sensitive     CEFEPIME <=1 SENSITIVE Sensitive     CEFTAZIDIME <=1 SENSITIVE Sensitive     CEFTRIAXONE <=1 SENSITIVE Sensitive     CIPROFLOXACIN <=0.25 SENSITIVE Sensitive     GENTAMICIN <=1 SENSITIVE Sensitive     IMIPENEM 0.5 SENSITIVE Sensitive     TRIMETH/SULFA <=20 SENSITIVE Sensitive     AMPICILLIN/SULBACTAM 4 SENSITIVE Sensitive     PIP/TAZO <=4 SENSITIVE Sensitive     Extended ESBL NEGATIVE Sensitive     * KLEBSIELLA PNEUMONIAE  Blood Culture ID Panel (Reflexed)     Status: Abnormal   Collection Time: 10/29/18  3:23 PM  Result Value  Ref Range Status   Enterococcus species NOT DETECTED NOT DETECTED Final   Listeria monocytogenes NOT DETECTED NOT DETECTED Final   Staphylococcus species NOT DETECTED NOT DETECTED Final   Staphylococcus aureus (BCID) NOT DETECTED NOT DETECTED Final   Streptococcus species NOT DETECTED NOT DETECTED Final   Streptococcus agalactiae NOT DETECTED NOT DETECTED Final   Streptococcus pneumoniae NOT DETECTED NOT DETECTED Final   Streptococcus pyogenes NOT DETECTED NOT DETECTED Final   Acinetobacter baumannii NOT DETECTED NOT DETECTED Final   Enterobacteriaceae species DETECTED (A) NOT DETECTED Final    Comment: Enterobacteriaceae represent a large family of gram-negative bacteria, not a single organism. CRITICAL RESULT CALLED TO, READ BACK BY AND VERIFIED WITH: T. EGAN,PHARMD 0423 10/30/2018 T. TYSOR    Enterobacter cloacae complex NOT DETECTED NOT DETECTED Final   Escherichia coli NOT DETECTED NOT DETECTED Final   Klebsiella oxytoca NOT DETECTED NOT DETECTED Final   Klebsiella pneumoniae DETECTED (A) NOT DETECTED Final    Comment: CRITICAL RESULT CALLED TO, READ BACK BY AND VERIFIED WITH: T. EGAN,PHARMD 0423 10/30/2018 T. TYSOR    Proteus species NOT DETECTED NOT DETECTED Final   Serratia marcescens NOT DETECTED NOT DETECTED Final   Carbapenem resistance NOT DETECTED NOT DETECTED Final   Haemophilus influenzae NOT DETECTED NOT DETECTED Final   Neisseria meningitidis NOT DETECTED NOT DETECTED Final   Pseudomonas aeruginosa NOT DETECTED NOT DETECTED Final   Candida albicans NOT DETECTED NOT DETECTED Final   Candida glabrata NOT DETECTED NOT DETECTED Final   Candida krusei NOT DETECTED NOT DETECTED Final   Candida parapsilosis NOT DETECTED NOT DETECTED Final   Candida tropicalis NOT DETECTED NOT DETECTED Final    Comment: Performed at [facility], [place]. [address] [place], [place] [address]  Urine Culture     Status: Abnormal   Collection Time: 10/29/18  7:09 PM   Specimen: Kidney,  Left; Urine  Result Value Ref Range Status   Specimen Description KIDNEY LEFT URINE  Final   Special Requests   Final    A Performed at [facility], [address], [place], [place] [address]    Culture >=100,000 COLONIES/mL KLEBSIELLA PNEUMONIAE (A)  Final   Report Status 10/31/2018 FINAL  Final   Organism ID, Bacteria KLEBSIELLA PNEUMONIAE (A)  Final      Susceptibility   Klebsiella pneumoniae - MIC*    AMPICILLIN >=32 RESISTANT Resistant     CEFAZOLIN <=4 SENSITIVE Sensitive     CEFEPIME <=1 SENSITIVE Sensitive     CEFTAZIDIME <=1 SENSITIVE Sensitive  CEFTRIAXONE <=1 SENSITIVE Sensitive     CIPROFLOXACIN <=0.25 SENSITIVE Sensitive     GENTAMICIN <=1 SENSITIVE Sensitive     IMIPENEM 0.5 SENSITIVE Sensitive     TRIMETH/SULFA <=20 SENSITIVE Sensitive     AMPICILLIN/SULBACTAM 4 SENSITIVE Sensitive     PIP/TAZO <=4 SENSITIVE Sensitive     Extended ESBL NEGATIVE Sensitive     * >=100,000 COLONIES/mL KLEBSIELLA PNEUMONIAE  MRSA PCR Screening     Status: None   Collection Time: 10/30/18  8:30 AM   Specimen: Nasopharyngeal  Result Value Ref Range Status   MRSA by PCR NEGATIVE NEGATIVE Final    Comment:        The GeneXpert MRSA Assay (FDA approved for NASAL specimens only), is one component of a comprehensive MRSA colonization surveillance program. It is not intended to diagnose MRSA infection nor to guide or monitor treatment for MRSA infections. Performed at [facility], [place] [address], [place], [place] [address]     Studies/Results: Dg Chest Port 1 View  Result Date: 11/02/2018 CLINICAL DATA:  New onset fevers EXAM: PORTABLE CHEST 1 VIEW COMPARISON:  10/29/2018 FINDINGS: Cardiac shadow is prominent accentuated by the portable technique. Increasing density in the left base is noted likely related to underlying atelectasis. No other focal abnormality is seen. IMPRESSION: Increasing parenchymal opacity in the left base consistent with increasing atelectasis.  Electronically Signed   By: Inez Catalina M.D.   On: 11/02/2018 12:31    Assessment/Plan: 47yo with left ureteral calculus, sepsis from a urinary source 1. Pain control regiment will be adjusted to dilaudid and percocet for better pain control. 2. Pyelonephritis and sepsis: Antibiotic changed to ceftriaxone for better Gram negative coverage    LOS: 4 days   Nicolette Bang 11/02/2018, 8:47 PM

## 2018-11-02 NOTE — Progress Notes (Signed)
IR following for recent PCN placement.   Note blood and urine cultures redrawn today.  FM has ordered CT Abdomen Pelvis.   Will review imaging with MD tomorrow.  NPO p MN for now.  IR following.   RN aware.  Brynda Greathouse, MS RD PA-C

## 2018-11-02 NOTE — Progress Notes (Addendum)
Pt states she is feeling "feverish" with chills. Her recent temp is 100.40F, paged [facility]. Scheduled Tylenol already given. Chest Xray, blood and urine culture ordered. Will continue to monitor.

## 2018-11-02 NOTE — Plan of Care (Signed)
  Problem: Pain Managment: Goal: General experience of comfort will improve Outcome: Progressing Note: Pt with acute pain r/t kidney stone. Report pain level of 8/10. Pain is being addressed by PRN and scheduled medications, comfort measures, and distraction. Pt given pain control interventions as needed, as ordered. Pain level re-assessed as needed. Will continue to monitor for effectiveness.

## 2018-11-03 ENCOUNTER — Inpatient Hospital Stay (HOSPITAL_COMMUNITY): Payer: Managed Care, Other (non HMO)

## 2018-11-03 HISTORY — PX: IR NEPHROSTOMY EXCHANGE LEFT: IMG6069

## 2018-11-03 LAB — CBC WITH DIFFERENTIAL/PLATELET
Abs Immature Granulocytes: 0.37 10*3/uL — ABNORMAL HIGH (ref 0.00–0.07)
Basophils Absolute: 0 10*3/uL (ref 0.0–0.1)
Basophils Relative: 0 %
Eosinophils Absolute: 0 10*3/uL (ref 0.0–0.5)
Eosinophils Relative: 0 %
HCT: 29.6 % — ABNORMAL LOW (ref 36.0–46.0)
Hemoglobin: 10.1 g/dL — ABNORMAL LOW (ref 12.0–15.0)
Immature Granulocytes: 3 %
Lymphocytes Relative: 9 %
Lymphs Abs: 1.2 10*3/uL (ref 0.7–4.0)
MCH: 29.9 pg (ref 26.0–34.0)
MCHC: 34.1 g/dL (ref 30.0–36.0)
MCV: 87.6 fL (ref 80.0–100.0)
Monocytes Absolute: 1.6 10*3/uL — ABNORMAL HIGH (ref 0.1–1.0)
Monocytes Relative: 12 %
Neutro Abs: 10 10*3/uL — ABNORMAL HIGH (ref 1.7–7.7)
Neutrophils Relative %: 76 %
Platelets: 193 10*3/uL (ref 150–400)
RBC: 3.38 MIL/uL — ABNORMAL LOW (ref 3.87–5.11)
RDW: 14.4 % (ref 11.5–15.5)
WBC: 13.2 10*3/uL — ABNORMAL HIGH (ref 4.0–10.5)
nRBC: 0.2 % (ref 0.0–0.2)

## 2018-11-03 LAB — URINE CULTURE: Culture: NO GROWTH

## 2018-11-03 LAB — RENAL FUNCTION PANEL
Albumin: 1.7 g/dL — ABNORMAL LOW (ref 3.5–5.0)
Anion gap: 8 (ref 5–15)
BUN: 5 mg/dL — ABNORMAL LOW (ref 6–20)
CO2: 20 mmol/L — ABNORMAL LOW (ref 22–32)
Calcium: 7.7 mg/dL — ABNORMAL LOW (ref 8.9–10.3)
Chloride: 108 mmol/L (ref 98–111)
Creatinine, Ser: 0.99 mg/dL (ref 0.44–1.00)
GFR calc Af Amer: >60 mL/min (ref >60)
GFR calc non Af Amer: >60 mL/min (ref >60)
Glucose, Bld: 94 mg/dL (ref 70–99)
Phosphorus: 2.8 mg/dL (ref 2.5–4.6)
Potassium: 3.3 mmol/L — ABNORMAL LOW (ref 3.5–5.1)
Sodium: 136 mmol/L (ref 135–145)

## 2018-11-03 MED ORDER — LIDOCAINE HCL 1 % IJ SOLN
INTRAMUSCULAR | Status: AC | PRN
  Administered 2018-11-03: 10 mL

## 2018-11-03 MED ORDER — OXYCODONE HCL 5 MG PO TABS
5.0000 mg | ORAL_TABLET | ORAL | Status: DC | PRN
  Administered 2018-11-04 – 2018-11-09 (×13): 5 mg via ORAL
  Filled 2018-11-03 (×16): qty 1

## 2018-11-03 MED ORDER — MIDAZOLAM HCL 2 MG/2ML IJ SOLN
INTRAMUSCULAR | Status: AC
  Filled 2018-11-03: qty 2

## 2018-11-03 MED ORDER — SODIUM CHLORIDE 0.9% FLUSH
5.0000 mL | Freq: Three times a day (TID) | INTRAVENOUS | Status: DC
  Administered 2018-11-03 – 2018-11-07 (×10): 5 mL

## 2018-11-03 MED ORDER — IBUPROFEN 200 MG PO TABS
600.0000 mg | ORAL_TABLET | Freq: Four times a day (QID) | ORAL | Status: DC | PRN
  Administered 2018-11-03 – 2018-11-06 (×6): 600 mg via ORAL
  Filled 2018-11-03 (×7): qty 3

## 2018-11-03 MED ORDER — IOHEXOL 300 MG/ML  SOLN
50.0000 mL | Freq: Once | INTRAMUSCULAR | Status: AC | PRN
  Administered 2018-11-03: 16:00:00 5 mL

## 2018-11-03 MED ORDER — LORAZEPAM 2 MG/ML IJ SOLN
INTRAMUSCULAR | Status: AC | PRN
  Administered 2018-11-03: 0.5 mg via INTRAVENOUS

## 2018-11-03 MED ORDER — POTASSIUM CHLORIDE CRYS ER 20 MEQ PO TBCR
40.0000 meq | EXTENDED_RELEASE_TABLET | Freq: Once | ORAL | Status: AC
  Administered 2018-11-03: 40 meq via ORAL
  Filled 2018-11-03: qty 2

## 2018-11-03 MED ORDER — FENTANYL CITRATE (PF) 100 MCG/2ML IJ SOLN
INTRAMUSCULAR | Status: AC
  Filled 2018-11-03: qty 2

## 2018-11-03 MED ORDER — HYDROMORPHONE HCL 1 MG/ML IJ SOLN
1.0000 mg | INTRAMUSCULAR | Status: DC | PRN
  Administered 2018-11-03 – 2018-11-09 (×6): 1 mg via INTRAVENOUS
  Filled 2018-11-03 (×7): qty 1

## 2018-11-03 MED ORDER — LIDOCAINE HCL 1 % IJ SOLN
INTRAMUSCULAR | Status: AC
  Filled 2018-11-03: qty 20

## 2018-11-03 MED ORDER — FENTANYL CITRATE (PF) 100 MCG/2ML IJ SOLN
INTRAMUSCULAR | Status: AC | PRN
  Administered 2018-11-03: 50 ug via INTRAVENOUS
  Administered 2018-11-03: 25 ug via INTRAVENOUS

## 2018-11-03 MED ORDER — MIDAZOLAM HCL 2 MG/2ML IJ SOLN
INTRAMUSCULAR | Status: AC | PRN
  Administered 2018-11-03: 1 mg via INTRAVENOUS

## 2018-11-03 NOTE — Progress Notes (Signed)
Patient off unit to IR for nephrostomy tube drain change via pt bed. IV flushed and saline locked. Universal masking precaution in place.

## 2018-11-03 NOTE — Procedures (Signed)
Interventional Radiology Procedure Note  Procedure: Image guided left PCN exchange, with new 9F dawson mueller.  .  Complications: None Recommendations:  - To gravity - Do not submerge - Routine drain care   Signed,  Dulcy Fanny. Earleen Newport, DO

## 2018-11-03 NOTE — Progress Notes (Signed)
OT Cancellation Note  Patient Details Name: Tamara Watson MRN: [id number] DOB: 1970/11/13   Cancelled Treatment:    Reason Eval/Treat Not Completed: Patient at procedure or test/ unavailable Pt on way to IR for neph tub replacement. Will continue to follow as available and appropriate to initiate OT POC.   Zenovia Jarred, MSOT, OTR/L Behavioral Health OT/ Acute Relief OT [facility] Office: [phone]  Zenovia Jarred 11/03/2018, 3:18 PM

## 2018-11-03 NOTE — Progress Notes (Signed)
Referring Physician(s): Dr. Alinda Money  Supervising Physician: Corrie Mckusick  Patient Status:  [facility]  Chief Complaint: Urosepsis  Subjective: Patient still with significant pain.  Pain meds adjusted yesterday to dilaudid and percocet.   Yellow urine in collection bag today.  Tachycardic. Febrile at 102.1 yesterday afternoon.  Tmax 100 so far today.  Allergies: Patient has no known allergies.  Medications: Prior to Admission medications   Medication Sig Start Date End Date Taking? Authorizing Provider  acetaminophen (TYLENOL) 500 MG tablet Take 1,000 mg by mouth every 6 (six) hours as needed for headache.   Yes [provider]  dexlansoprazole (DEXILANT) 60 MG capsule TAKE 1 CAPSULE BY MOUTH DAILY BEFORE BREAKFAST. Patient taking differently: Take 60 mg by mouth daily before breakfast.  08/01/18  Yes Nandigam, Venia Minks, MD  fluticasone (FLONASE) 50 MCG/ACT nasal spray Place 2 sprays into both nostrils daily. Patient taking differently: Place 2 sprays into both nostrils daily as needed for allergies.  11/08/16  Yes Rogue Bussing, MD  HYDROcodone-acetaminophen (NORCO/VICODIN) 5-325 MG tablet Take 1 tablet by mouth every 6 (six) hours as needed for up to 3 days for severe pain. 10/27/18 10/30/18 Yes Madilyn Hook A, PA-C  ibuprofen (ADVIL) 200 MG tablet Take 600 mg by mouth every 6 (six) hours as needed for headache (pain).   Yes [provider]  ondansetron (ZOFRAN) 4 MG tablet Take 1 tablet (4 mg total) by mouth every 6 (six) hours. 10/27/18  Yes Alveria Apley, PA-C  oxyCODONE-acetaminophen (PERCOCET/ROXICET) 5-325 MG tablet Take 1-2 tablets by mouth every 6 (six) hours as needed for moderate pain (once the patient tolerates po and PCA has been discontinued). Patient taking differently: Take 1-2 tablets by mouth every 6 (six) hours as needed for moderate pain.  06/16/18  Yes Waymon Amato, MD  senna-docusate (SENOKOT-S) 8.6-50 MG tablet Take 2 tablets by  mouth at bedtime as needed for mild constipation or moderate constipation. 06/16/18  Yes Waymon Amato, MD  tamsulosin (FLOMAX) 0.4 MG CAPS capsule Take 1 capsule (0.4 mg total) by mouth daily. 10/27/18 11/26/18 Yes Madilyn Hook A, PA-C  vitamin C (ASCORBIC ACID) 500 MG tablet Take 500 mg by mouth daily as needed (immune system boost/ cold symptoms).    Yes [provider]  benzonatate (TESSALON) 100 MG capsule Take 1 capsule (100 mg total) by mouth 3 (three) times daily as needed for cough. Patient not taking: Reported on 10/29/2018 01/09/18   [place] Bing, DO  docusate sodium (COLACE) 100 MG capsule Take 1 capsule (100 mg total) by mouth 2 (two) times daily. Patient not taking: Reported on 10/29/2018 06/16/18   Waymon Amato, MD  ibuprofen (ADVIL) 600 MG tablet Take 1 tablet (600 mg total) by mouth every 6 (six) hours. Patient not taking: Reported on 10/29/2018 06/19/18   Waymon Amato, MD  lidocaine (LIDODERM) 5 % Place 1 patch onto the skin daily. Remove & Discard patch within 12 hours or as directed by MD Patient not taking: Reported on 10/29/2018 06/16/18   Waymon Amato, MD  LINZESS 72 MCG capsule TAKE 1 CAPSULE (72 MCG TOTAL) BY MOUTH DAILY BEFORE BREAKFAST. Patient not taking: No sig reported 11/03/17   Mauri Pole, MD  pantoprazole (PROTONIX) 40 MG tablet Take 1 tablet (40 mg total) by mouth daily. Patient not taking: Reported on 10/29/2018 06/17/18   Waymon Amato, MD     Vital Signs: BP (!) 142/78 (BP Location: Left Arm)    Pulse 95  Temp 100 F (37.8 C) (Oral)    Resp (!) 28    Ht 5' 5.5" (1.664 m)    Wt 227 lb 8.2 oz (103.2 kg)    LMP 06/05/2018    SpO2 100%    BMI 37.28 kg/m   Physical Exam  NAD, alert Abdomen:  Generalized tenderness to left flank slightly improved. L PCN in place.  Insertion site c/d/i. Yellow urine output.  Cannot tolerate flush.   Imaging: Ct Abdomen Pelvis Wo Contrast  Result Date: 11/03/2018 CLINICAL DATA:  Complicated pyelonephritis. Left nephrostomy  tube in place draining blood tinged fluid. EXAM: CT ABDOMEN AND PELVIS WITHOUT CONTRAST TECHNIQUE: Multidetector CT imaging of the abdomen and pelvis was performed following the standard protocol without IV contrast. COMPARISON:  CT 10/30/2018, 10/29/2018, additional priors. FINDINGS: Lower chest: Small bilateral pleural effusions, left greater than right, slight increase from prior exam. Associated atelectasis. Hepatobiliary: Hepatic steatosis. Gallbladder physiologically distended, no calcified stone. No biliary dilatation. Pancreas: No ductal dilatation or inflammation. Spleen: Normal in size without focal abnormality. Adrenals/Urinary Tract: Normal adrenal glands. Left nephrostomy tube unchanged position with tip in the renal pelvis. No abnormality along the course of the nephrostomy tubing. Left hydronephrosis has near completely resolved. Persistent and slight worsening left perinephric edema. Questionable developing low-density region in the inferior left kidney, series 3, image 37. There is persistent left periureteric edema with 5 mm stone at the left ureterovesicular junction. Urinary bladder physiologically distended. No bladder stone. No right hydronephrosis or urolithiasis. Stomach/Bowel: Stomach is within normal limits. Appendix appears normal. No evidence of bowel wall thickening, distention, or inflammatory changes. Administered enteric contrast reaches the distal colon. Vascular/Lymphatic: Abdominal aorta is normal in caliber. Probable small left retroperitoneal nodes not well assessed. No bulky abdominopelvic adenopathy. Reproductive: 2.8 cm cyst in the right ovary is unchanged in size, probable fluid level suggesting hemorrhagic cyst. Left ovary is unremarkable. Post hysterectomy. Other: Mild generalized body wall edema. No free fluid or free air in the abdomen or pelvis. Musculoskeletal: There are no acute or suspicious osseous abnormalities. IMPRESSION: 1. Unchanged position of left nephrostomy  tube with near complete resolution of left hydronephrosis. Persistent left periureteric edema with 5 mm stone at the left ureterovesicular junction. 2. Questionable developing low-density phlegmon/abscess in the inferior left kidney, poorly defined in the absence of IV contrast. Left perinephric edema has increased. 3. Small bilateral pleural effusions, left greater than right, slight increase in size from prior exam. 4. Hepatic steatosis. Electronically Signed   By: Keith Rake M.D.   On: 11/03/2018 00:12   Ct Abdomen Pelvis Wo Contrast  Result Date: 10/30/2018 CLINICAL DATA:  Flank pain. Pain after left percutaneous nephrostomy tube placement 1 flushing catheter. EXAM: CT ABDOMEN AND PELVIS WITHOUT CONTRAST TECHNIQUE: Multidetector CT imaging of the abdomen and pelvis was performed following the standard protocol without IV contrast. COMPARISON:  CT yesterday. FINDINGS: Lower chest: Left pleural effusion with adjacent atelectasis, minimally increased from prior. Trace right pleural effusion is new. Hepatobiliary: No focal hepatic abnormality on noncontrast exam. Mild steatosis. Prominent size liver spanning 22 cm cranial caudal. Gallbladder physiologically distended, no calcified stone. No biliary dilatation. Pancreas: No ductal dilatation or inflammation. Spleen: Normal in size without focal abnormality. Adrenals/Urinary Tract: Normal adrenal glands. Percutaneous left nephrostomy tube with pigtail in the renal pelvis. No fluid collection or abnormality along the course of the tubing. Persistent but slight decreased left hydronephrosis from prior exam. Persistent left hydroureter and periureteric stranding with obstructing 5 mm stone in the distal  ureter. The stone is not significantly changed in position from prior exam. No evidence of perinephric or intra nephric fluid collection. No right hydronephrosis. No right urolithiasis. Right ureter is decompressed. Urinary bladder is minimally distended. No  bladder stone. Stomach/Bowel: Stomach is within normal limits. Appendix appears normal. No evidence of bowel wall thickening, distention, or inflammatory changes. Vascular/Lymphatic: Abdominal aorta is normal in caliber. No bulky abdominopelvic adenopathy. Reproductive: Unchanged from prior. Similar 3 cm right ovarian cyst. Prior hysterectomy. Other: Trace free fluid in the pelvis. No free air. No evidence of intra-abdominal abscess. Musculoskeletal: There are no acute or suspicious osseous abnormalities. IMPRESSION: 1. Percutaneous left nephrostomy tube with pigtail in the renal pelvis. No evidence of complication. Decreased hydronephrosis from prior exam. Unchanged obstructing 5 mm stone in the distal left ureter. 2. Left pleural effusion with adjacent atelectasis, minimally increased from prior. Trace right pleural effusion is new. 3. Mild hepatic steatosis and hepatomegaly. 4. Unchanged 3 cm right ovarian cyst. Electronically Signed   By: Keith Rake M.D.   On: 10/30/2018 23:39   Dg Chest Port 1 [facility]  Result Date: 11/02/2018 CLINICAL DATA:  New onset fevers EXAM: PORTABLE CHEST 1 [facility] COMPARISON:  10/29/2018 FINDINGS: Cardiac shadow is prominent accentuated by the portable technique. Increasing density in the left base is noted likely related to underlying atelectasis. No other focal abnormality is seen. IMPRESSION: Increasing parenchymal opacity in the left base consistent with increasing atelectasis. Electronically Signed   By: Inez Catalina M.D.   On: 11/02/2018 12:31   Vas [place] Lower Extremity Venous (dvt)  Result Date: 10/31/2018  Lower Venous Study Indications: Elevated d-dimer.  Limitations: Body habitus. Comparison Study: No prior study. Performing Technologist: Maudry Mayhew MHA, RDMS, RVT, RDCS  Examination Guidelines: A complete evaluation includes B-mode imaging, spectral Doppler, color Doppler, and power Doppler as needed of all accessible portions of each vessel. Bilateral testing is  considered an integral part of a complete examination. Limited examinations for reoccurring indications may be performed as noted.  +---------+---------------+---------+-----------+----------+--------------+  RIGHT     Compressibility Phasicity Spontaneity Properties Thrombus Aging  +---------+---------------+---------+-----------+----------+--------------+  CFV       Full            Yes       Yes                                    +---------+---------------+---------+-----------+----------+--------------+  SFJ       Full                                                             +---------+---------------+---------+-----------+----------+--------------+  FV Prox   Full                                                             +---------+---------------+---------+-----------+----------+--------------+  FV Mid    Full                                                             +---------+---------------+---------+-----------+----------+--------------+  FV Distal Full                                                             +---------+---------------+---------+-----------+----------+--------------+  PFV       Full                                                             +---------+---------------+---------+-----------+----------+--------------+  POP       Full            Yes       Yes                                    +---------+---------------+---------+-----------+----------+--------------+  PTV       Full                                                             +---------+---------------+---------+-----------+----------+--------------+  PERO      Full                                                             +---------+---------------+---------+-----------+----------+--------------+   +---------+---------------+---------+-----------+----------+--------------+  LEFT      Compressibility Phasicity Spontaneity Properties Thrombus Aging   +---------+---------------+---------+-----------+----------+--------------+  CFV       Full            Yes       Yes                                    +---------+---------------+---------+-----------+----------+--------------+  SFJ       Full                                                             +---------+---------------+---------+-----------+----------+--------------+  FV Prox   Full                                                             +---------+---------------+---------+-----------+----------+--------------+  FV Mid    Full                                                             +---------+---------------+---------+-----------+----------+--------------+  FV Distal Full                                                             +---------+---------------+---------+-----------+----------+--------------+  PFV       Full                                                             +---------+---------------+---------+-----------+----------+--------------+  POP       Full            Yes       Yes                                    +---------+---------------+---------+-----------+----------+--------------+  PTV       Full                                                             +---------+---------------+---------+-----------+----------+--------------+  PERO      Full                                                             +---------+---------------+---------+-----------+----------+--------------+  Summary: Right: There is no evidence of deep vein thrombosis in the lower extremity. No cystic structure found in the popliteal fossa. Left: There is no evidence of deep vein thrombosis in the lower extremity. No cystic structure found in the popliteal fossa.  *See table(s) above for measurements and observations. Electronically signed by Deitra Mayo MD on 10/31/2018 at 4:34:24 PM.    Final     Labs:  CBC: Recent Labs    10/31/18 0637 11/01/18 0111 11/02/18 0412 11/03/18 0614  WBC  10.1 9.2 10.8* 13.2*  HGB 12.1 12.7 11.1* 10.1*  HCT 36.5 38.2 33.1* 29.6*  PLT 53* 68* 118* 193    COAGS: Recent Labs    10/29/18 1443  INR 1.4*  APTT 29    BMP: Recent Labs    10/31/18 1023 11/01/18 0111 11/02/18 0412 11/03/18 0614  NA 136 137 137 136  K 3.8 3.7 3.3* 3.3*  CL 110 108 109 108  CO2 20* 20* 21* 20*  GLUCOSE 111* 97 110* 94  BUN 11 12 9  5*  CALCIUM 7.9* 8.0* 7.4* 7.7*  CREATININE 1.00 1.21* 1.22* 0.99  GFRNONAA >60 54* 53* >60  GFRAA >60 >60 >60 >60    LIVER FUNCTION TESTS: Recent Labs    05/24/18 1413 10/27/18 0750 10/29/18 1443 10/30/18 0017 11/01/18 0111 11/02/18 0412 11/03/18 0614  BILITOT 0.3 0.8 1.2 1.8*  --   --   --   AST 18 18 25 26   --   --   --   ALT  13 15 20 18   --   --   --   ALKPHOS 63 49 71 117  --   --   --   PROT 7.9 7.2 6.1* 5.2*  --   --   --   ALBUMIN 3.8 3.8 2.8* 2.1* 1.9* 1.7* 1.7*    Assessment and Plan: Urosepsis in the setting of nephrolithiasis s/p left PCN placement 9/27 by Dr. Kathlene Cote.  Pain medications adjusted yesterday. Yellow urine in collection bag today.   Repeat blood and urine cultures pending but both without growth thus far.   Remains tachycardic with intermittent fevers.   CT Abdomen Pelvis obtained overnight.  1. Unchanged position of left nephrostomy tube with near complete resolution of left hydronephrosis. Persistent left periureteric edema with 5 mm stone at the left ureterovesicular junction. 2. Questionable developing low-density phlegmon/abscess in the inferior left kidney, poorly defined in the absence of IV contrast. Left perinephric edema has increased. 3. Small bilateral pleural effusions, left greater than right, slight increase in size from prior exam. 4. Hepatic steatosis.  Discussed with Dr. Earleen Newport.  Proceed with drain exchange today.  Discussed with patient who agrees.  Consent obtained.  Risks and benefits were discussed with the patient including, but not limited to,  infection, bleeding, significant bleeding causing loss or decrease in renal function or damage to adjacent structures.   All of the patient's questions were answered, patient is agreeable to proceed.  Consent signed and in chart.  Electronically Signed: Docia Barrier, PA 11/03/2018, 10:31 AM   I spent a total of 15 Minutes at the the patient's bedside AND on the patient's hospital floor or unit, greater than 50% of which was counseling/coordinating care for urosepsis.

## 2018-11-03 NOTE — Progress Notes (Signed)
Physical Therapy Treatment Patient Details Name: [facility] MRN: [id number] DOB: Sep 10, 1970 Today's Date: 11/03/2018    History of Present Illness [facility] is a 48 y.o. female presenting with presumed sepsis with lactic acidosis most likely due to ureteral stone . PMH is significant for history of anemia due to menorrhagia and fibroids (s/p partial hysterectomy 06/2018), anxiety, constipation, GERD, and seasonal allergies. pt s/p nephrostomy tube placement by IR on 9/27.    PT Comments    Patient progressing slowly towards PT goals. Reports feeling weak and dizziness that came about during ambulation. Continues to require UE support on IV pole during gait training. Reports feeling slightly better since changing antibiotics. Pt frustrated about pain/fevers and not knowing the plan. Encouraged pt to keep walking to bathroom frequently. Also noted to have bil feet swelling, Rt>left. Recommended elevation of BLEs. VSS throughout. Will continue to follow.    No PT follow up;Supervision/Assistance - 24 hour     Equipment Recommendations  None recommended by PT    Recommendations for Other Services       Precautions / Restrictions Precautions Precautions: Fall Restrictions Weight Bearing Restrictions: No    Mobility  Bed Mobility Overal bed mobility: Needs Assistance Bed Mobility: Sit to Supine       Sit to supine: Modified independent (Device/Increase time);HOB elevated   General bed mobility comments: No assist needed to get into bed.  Transfers Overall transfer level: Needs assistance Equipment used: None Transfers: Sit to/from Stand Sit to Stand: Min guard         General transfer comment: increased time, no physical assist, grabbing onto IV pole for support.  Ambulation/Gait Ambulation/Gait assistance: Min guard;Min assist Gait Distance (Feet): 80 Feet Assistive device: IV Pole Gait Pattern/deviations: Step-through pattern;Decreased stride  length Gait velocity: slow   General Gait Details: Slow, mildly unsteady gait, reports dizziness halfway resulting in going back to room; wide BoS. Holding onto IV pole for support. 1 standing rest breaks. SP02 100% HR stable.   Stairs             Wheelchair Mobility    Modified Rankin (Stroke Patients Only)       Balance Overall balance assessment: Needs assistance Sitting-balance support: Feet supported;No upper extremity supported Sitting balance-Leahy Scale: Good     Standing balance support: During functional activity Standing balance-Leahy Scale: Fair Standing balance comment: pt holding onto counter while washing hands and requiring unilateral support during amb                            Cognition Arousal/Alertness: Awake/alert Behavior During Therapy: Flat affect Overall Cognitive Status: Within Functional Limits for tasks assessed                                 General Comments: Frustrated about pain and not knowing why she is NPO.      Exercises      General Comments        Pertinent Vitals/Pain Pain Assessment: 0-10 Pain Score: 5  Pain Location: Lft flank Pain Descriptors / Indicators: Constant;Throbbing Pain Intervention(s): Monitored during session;Repositioned    Home Living                      Prior Function            PT Goals (current goals can now be found in  the care plan section) Progress towards PT goals: Progressing toward goals    Frequency    Min 3X/week      PT Plan Current plan remains appropriate    Co-evaluation              AM-PAC PT "6 Clicks" Mobility   Outcome Measure  Help needed turning from your back to your side while in a flat bed without using bedrails?: None Help needed moving from lying on your back to sitting on the side of a flat bed without using bedrails?: None Help needed moving to and from a bed to a chair (including a wheelchair)?: A Little Help  needed standing up from a chair using your arms (e.g., wheelchair or bedside chair)?: A Little Help needed to walk in hospital room?: A Little Help needed climbing 3-5 steps with a railing? : A Little 6 Click Score: 20    End of Session Equipment Utilized During Treatment: Gait belt Activity Tolerance: Patient tolerated treatment well Patient left: in bed;with call bell/phone within reach Nurse Communication: Mobility status PT Visit Diagnosis: Difficulty in walking, not elsewhere classified (R26.2);Pain Pain - Right/Left: Left Pain - part of body: (flank)     Time: [id number] PT Time Calculation (min) (ACUTE ONLY): 23 min  Charges:  $Gait Training: 8-22 mins $Therapeutic Activity: 8-22 mins                     Wray Kearns, PT, DPT Acute Rehabilitation Services Pager [phone] Office [place] 11/03/2018, 9:23 AM

## 2018-11-03 NOTE — Progress Notes (Signed)
Patient arrived back on unit, VS are stable, reports pain level of 4/10. L nephrostomy tube in place and secured. Will continue to monitor.

## 2018-11-03 NOTE — Progress Notes (Addendum)
Family Medicine Teaching Service Daily Progress Note Intern Pager: [phone]  Patient name: Tamara Watson Medical record number: [id number] Date of birth: September 25, 1970 Age: 48 y.o. Gender: female  Primary Care Provider: Haywood Pao, MD Consultants: Urology Code Status: Full Code  Pt Overview and Major Events to Date:   Antibiotics: Vancomycin 9/27 Cefepime 9/27 Ceftriaxone 9/28-9/29 Cefazolin 9/29-10/1 CTX 10/1-  Assessment and Plan: Tamara D Tom-Johnsonis a 48 y.o.femalepresenting with presumed sepsis with lactic acidosis most likely due to ureteral stone. PMH is significant for history of anemia due to menorrhagiaand fibroids (s/p partialhysterectomy5/2020), anxiety, constipation, GERD, and seasonal allergies.  Sepsis due to Klebsiella bacteremia secondary to obstructing nephrolithiasis S/p Left Nephrostomy Tube placement Patient reports that her pain is improved.  Patient reports that she still has a great deal of nausea and is requesting that her Zofran be increased to higher frequency.  Patient found to have phlegmon on abdominal pelvic CT, will continue with antibiotics and IR to do drain exchange.  Patient remains afebrile overnight with intermittent tachycardia consistent with remainder of this admission. Blood cultures negative at 24 hours.  - IR following and planning to do drain exchange today - disontinued Cefazolin -continue CTX 2g daily (Day 6 of 14 day total antibiotic course) -Patient on oxycodone, Tylenol, Dilaudid for pain management - Regular diet, monitor p.o. intake -Continue fluids, 171mL/hr NS -Continue Miralax and Sennaas needed for narcotic induced constipation -Continue Flomax 0.4mg  -f/u repeat urine and blood cultures (blood cultures negative at 24 hours) -will continue to monitor fever curve  AKI, resolved Creatinine is 0.99 from 1.22. Currently on NS infusion at 125 mL/h.  -Continue to monitor Daily BMP -continue IV  fluids   Thrombocytopenia, resolved Platelets up trending from 193 from 118 this AM.  -Continue to follow with daily CBC  Anemia,Resolved.  Likely due to nephrostomy tube placement. Hemoglobin 15.1 on admission, 10.1 this AM.  -continue daily CBC  Tachycardia HR in the110sovernight,has been elevated since admission. Likely due to pain.  -consider CTAif tachycardia remains once fever and acute infection resolves  FEN/GI: regular diet PPx: protonix  Disposition: continued medical workup, transfer to floor   Subjective:  Patient reports feeling somewhat better but continues to have nausea and is requesting increased frequency for Zofran.   Objective: Temp:  [98.7 F (37.1 C)-102.1 F (38.9 C)] 100.4 F (38 C) (10/02 1124) Pulse Rate:  [95-111] 101 (10/02 1124) Resp:  [16-28] 22 (10/02 1124) BP: (124-156)/(64-81) 139/72 (10/02 1124) SpO2:  [96 %-100 %] 100 % (10/02 1124) Weight:  [103.2 kg] 103.2 kg (10/02 0500)  Physical Exam: General:  female lying in no acute distress, improved overall from prior exam Cardiovascular: Regular rate and rhythm, no friction rub, no gallops, cap refill less than 2 seconds Respiratory: Clear to auscultation bilaterally with no wheezing, no crackles, some decrease lung sounds at bilateral bases patient with normal work of breathing on room air Abdomen: Soft, nontender, bowel sounds present Extremities: No edema appreciated  Laboratory: Recent Labs  Lab 11/01/18 0111 11/02/18 0412 11/03/18 0614  WBC 9.2 10.8* 13.2*  HGB 12.7 11.1* 10.1*  HCT 38.2 33.1* 29.6*  PLT 68* 118* 193   Recent Labs  Lab 10/29/18 1443 10/30/18 0017  11/01/18 0111 11/02/18 0412 11/03/18 0614  NA 132* 137   < > 137 137 136  K 3.1* 3.1*   < > 3.7 3.3* 3.3*  CL 100 108   < > 108 109 108  CO2 18* 19*   < > 20*  21* 20*  BUN 18 18   < > 12 9 5*  CREATININE 1.53* 1.56*   < > 1.21* 1.22* 0.99  CALCIUM 7.9* 7.1*   < > 8.0* 7.4* 7.7*  PROT 6.1* 5.2*  --    --   --   --   BILITOT 1.2 1.8*  --   --   --   --   ALKPHOS 71 117  --   --   --   --   ALT 20 18  --   --   --   --   AST 25 26  --   --   --   --   GLUCOSE 117* 107*   < > 97 110* 94   < > = values in this interval not displayed.    Imaging/Diagnostic Tests:  Abdominal and Pelvic CT:  1. Unchanged position of left nephrostomy tube with near complete resolution of left hydronephrosis. Persistent left periureteric edema with 5 mm stone at the left ureterovesicular junction. 2. Questionable developing low-density phlegmon/abscess in the inferior left kidney, poorly defined in the absence of IV contrast. Left perinephric edema has increased. 3. Small bilateral pleural effusions, left greater than right, slight increase in size from prior exam. 4. Hepatic steatosis  Tamara Klein, MD 11/03/2018, 12:22 PM PGY-1 Tamara Watson Intern pager: [phone], text pages welcome

## 2018-11-03 NOTE — Progress Notes (Signed)
   11/03/18 1124  MEWS Score  Resp (!) 22  ECG Heart Rate 100  Pulse Rate (!) 101  BP 139/72  Temp (!) 100.4 F (38 C)  Level of Consciousness Alert  SpO2 100 %  O2 Device Room Air   Pt with low grade fever and complaining of chills. She is requesting Ibuprofen to alternate with Tylenol. Paged MD, RN will administer medication per order. Will continue to monitor.

## 2018-11-04 ENCOUNTER — Encounter (HOSPITAL_COMMUNITY): Payer: Self-pay | Admitting: Interventional Radiology

## 2018-11-04 DIAGNOSIS — R110 Nausea: Secondary | ICD-10-CM

## 2018-11-04 DIAGNOSIS — N151 Renal and perinephric abscess: Secondary | ICD-10-CM

## 2018-11-04 LAB — RENAL FUNCTION PANEL
Albumin: 1.9 g/dL — ABNORMAL LOW (ref 3.5–5.0)
Anion gap: 9 (ref 5–15)
BUN: 5 mg/dL — ABNORMAL LOW (ref 6–20)
CO2: 21 mmol/L — ABNORMAL LOW (ref 22–32)
Calcium: 7.9 mg/dL — ABNORMAL LOW (ref 8.9–10.3)
Chloride: 108 mmol/L (ref 98–111)
Creatinine, Ser: 0.95 mg/dL (ref 0.44–1.00)
GFR calc Af Amer: >60 mL/min (ref >60)
GFR calc non Af Amer: >60 mL/min (ref >60)
Glucose, Bld: 106 mg/dL — ABNORMAL HIGH (ref 70–99)
Phosphorus: 3 mg/dL (ref 2.5–4.6)
Potassium: 3.5 mmol/L (ref 3.5–5.1)
Sodium: 138 mmol/L (ref 135–145)

## 2018-11-04 LAB — CBC WITH DIFFERENTIAL/PLATELET
Abs Immature Granulocytes: 0.28 10*3/uL — ABNORMAL HIGH (ref 0.00–0.07)
Basophils Absolute: 0 10*3/uL (ref 0.0–0.1)
Basophils Relative: 0 %
Eosinophils Absolute: 0 10*3/uL (ref 0.0–0.5)
Eosinophils Relative: 0 %
HCT: 32 % — ABNORMAL LOW (ref 36.0–46.0)
Hemoglobin: 10.5 g/dL — ABNORMAL LOW (ref 12.0–15.0)
Immature Granulocytes: 2 %
Lymphocytes Relative: 10 %
Lymphs Abs: 1.2 10*3/uL (ref 0.7–4.0)
MCH: 29.2 pg (ref 26.0–34.0)
MCHC: 32.8 g/dL (ref 30.0–36.0)
MCV: 89.1 fL (ref 80.0–100.0)
Monocytes Absolute: 1 10*3/uL (ref 0.1–1.0)
Monocytes Relative: 8 %
Neutro Abs: 10.4 10*3/uL — ABNORMAL HIGH (ref 1.7–7.7)
Neutrophils Relative %: 80 %
Platelets: 266 10*3/uL (ref 150–400)
RBC: 3.59 MIL/uL — ABNORMAL LOW (ref 3.87–5.11)
RDW: 14.4 % (ref 11.5–15.5)
WBC: 13 10*3/uL — ABNORMAL HIGH (ref 4.0–10.5)
nRBC: 0.2 % (ref 0.0–0.2)

## 2018-11-04 MED ORDER — HYDROXYZINE HCL 10 MG PO TABS
10.0000 mg | ORAL_TABLET | Freq: Three times a day (TID) | ORAL | Status: DC | PRN
  Administered 2018-11-05 – 2018-11-09 (×10): 10 mg via ORAL
  Filled 2018-11-04 (×12): qty 1

## 2018-11-04 MED ORDER — LORAZEPAM 2 MG/ML IJ SOLN
0.5000 mg | Freq: Once | INTRAMUSCULAR | Status: AC
  Administered 2018-11-04: 15:00:00 0.5 mg via INTRAVENOUS
  Filled 2018-11-04: qty 1

## 2018-11-04 MED ORDER — ONDANSETRON HCL 4 MG PO TABS: 4.0000 mg | ORAL_TABLET | Freq: Four times a day (QID) | ORAL | Status: DC | PRN

## 2018-11-04 MED ORDER — ONDANSETRON HCL 4 MG/2ML IJ SOLN
4.0000 mg | Freq: Four times a day (QID) | INTRAMUSCULAR | Status: DC | PRN
  Administered 2018-11-04: 4 mg via INTRAVENOUS
  Filled 2018-11-04: qty 2

## 2018-11-04 MED ORDER — SODIUM CHLORIDE 0.9 % IV SOLN
8.0000 mg | Freq: Four times a day (QID) | INTRAVENOUS | Status: DC | PRN
  Administered 2018-11-04: 8 mg via INTRAVENOUS
  Filled 2018-11-04 (×2): qty 4

## 2018-11-04 MED ORDER — ONDANSETRON HCL 4 MG PO TABS: 8.0000 mg | ORAL_TABLET | Freq: Four times a day (QID) | ORAL | Status: DC | PRN

## 2018-11-04 NOTE — Progress Notes (Signed)
Referring Physician(s): Borden,L  Supervising Physician: Corrie Mckusick  Patient Status:  [facility] - In-pt  Chief Complaint:  Abdominal pain, nausea, bloating  Subjective: Pt stating that she feels miserable with no appetite, abd bloating and nausea; feels as if she has made no improvement with her symptoms  Allergies: Patient has no known allergies.  Medications: Prior to Admission medications   Medication Sig Start Date End Date Taking? Authorizing Provider  acetaminophen (TYLENOL) 500 MG tablet Take 1,000 mg by mouth every 6 (six) hours as needed for headache.   Yes [provider]  dexlansoprazole (DEXILANT) 60 MG capsule TAKE 1 CAPSULE BY MOUTH DAILY BEFORE BREAKFAST. Patient taking differently: Take 60 mg by mouth daily before breakfast.  08/01/18  Yes Nandigam, Venia Minks, MD  fluticasone (FLONASE) 50 MCG/ACT nasal spray Place 2 sprays into both nostrils daily. Patient taking differently: Place 2 sprays into both nostrils daily as needed for allergies.  11/08/16  Yes Rogue Bussing, MD  ibuprofen (ADVIL) 200 MG tablet Take 600 mg by mouth every 6 (six) hours as needed for headache (pain).   Yes [provider]  ondansetron (ZOFRAN) 4 MG tablet Take 1 tablet (4 mg total) by mouth every 6 (six) hours. 10/27/18  Yes Alveria Apley, PA-C  oxyCODONE-acetaminophen (PERCOCET/ROXICET) 5-325 MG tablet Take 1-2 tablets by mouth every 6 (six) hours as needed for moderate pain (once the patient tolerates po and PCA has been discontinued). Patient taking differently: Take 1-2 tablets by mouth every 6 (six) hours as needed for moderate pain.  06/16/18  Yes Waymon Amato, MD  senna-docusate (SENOKOT-S) 8.6-50 MG tablet Take 2 tablets by mouth at bedtime as needed for mild constipation or moderate constipation. 06/16/18  Yes Waymon Amato, MD  tamsulosin (FLOMAX) 0.4 MG CAPS capsule Take 1 capsule (0.4 mg total) by mouth daily. 10/27/18 11/26/18 Yes Madilyn Hook A, PA-C   vitamin C (ASCORBIC ACID) 500 MG tablet Take 500 mg by mouth daily as needed (immune system boost/ cold symptoms).    Yes [provider]  benzonatate (TESSALON) 100 MG capsule Take 1 capsule (100 mg total) by mouth 3 (three) times daily as needed for cough. Patient not taking: Reported on 10/29/2018 01/09/18   [place] Bing, DO  docusate sodium (COLACE) 100 MG capsule Take 1 capsule (100 mg total) by mouth 2 (two) times daily. Patient not taking: Reported on 10/29/2018 06/16/18   Waymon Amato, MD  ibuprofen (ADVIL) 600 MG tablet Take 1 tablet (600 mg total) by mouth every 6 (six) hours. Patient not taking: Reported on 10/29/2018 06/19/18   Waymon Amato, MD  lidocaine (LIDODERM) 5 % Place 1 patch onto the skin daily. Remove & Discard patch within 12 hours or as directed by MD Patient not taking: Reported on 10/29/2018 06/16/18   Waymon Amato, MD  LINZESS 72 MCG capsule TAKE 1 CAPSULE (72 MCG TOTAL) BY MOUTH DAILY BEFORE BREAKFAST. Patient not taking: No sig reported 11/03/17   Mauri Pole, MD  pantoprazole (PROTONIX) 40 MG tablet Take 1 tablet (40 mg total) by mouth daily. Patient not taking: Reported on 10/29/2018 06/17/18   Waymon Amato, MD     Vital Signs: BP (!) 154/80 (BP Location: Left Wrist)   Pulse 75   Temp (!) 97.3 F (36.3 C) (Axillary)   Resp 12   Ht 5' 5.5" (1.664 m)   Wt 223 lb 1.7 oz (101.2 kg)   LMP 06/05/2018   SpO2 100%   BMI 36.56 kg/m  Physical Exam awake/alert; left PCN intact, insertion site ok, mildly tender, output 350 cc sl blood-tinged urine  Imaging: Ct Abdomen Pelvis Wo Contrast  Result Date: 11/03/2018 CLINICAL DATA:  Complicated pyelonephritis. Left nephrostomy tube in place draining blood tinged fluid. EXAM: CT ABDOMEN AND PELVIS WITHOUT CONTRAST TECHNIQUE: Multidetector CT imaging of the abdomen and pelvis was performed following the standard protocol without IV contrast. COMPARISON:  CT 10/30/2018, 10/29/2018, additional priors. FINDINGS: Lower  chest: Small bilateral pleural effusions, left greater than right, slight increase from prior exam. Associated atelectasis. Hepatobiliary: Hepatic steatosis. Gallbladder physiologically distended, no calcified stone. No biliary dilatation. Pancreas: No ductal dilatation or inflammation. Spleen: Normal in size without focal abnormality. Adrenals/Urinary Tract: Normal adrenal glands. Left nephrostomy tube unchanged position with tip in the renal pelvis. No abnormality along the course of the nephrostomy tubing. Left hydronephrosis has near completely resolved. Persistent and slight worsening left perinephric edema. Questionable developing low-density region in the inferior left kidney, series 3, image 37. There is persistent left periureteric edema with 5 mm stone at the left ureterovesicular junction. Urinary bladder physiologically distended. No bladder stone. No right hydronephrosis or urolithiasis. Stomach/Bowel: Stomach is within normal limits. Appendix appears normal. No evidence of bowel wall thickening, distention, or inflammatory changes. Administered enteric contrast reaches the distal colon. Vascular/Lymphatic: Abdominal aorta is normal in caliber. Probable small left retroperitoneal nodes not well assessed. No bulky abdominopelvic adenopathy. Reproductive: 2.8 cm cyst in the right ovary is unchanged in size, probable fluid level suggesting hemorrhagic cyst. Left ovary is unremarkable. Post hysterectomy. Other: Mild generalized body wall edema. No free fluid or free air in the abdomen or pelvis. Musculoskeletal: There are no acute or suspicious osseous abnormalities. IMPRESSION: 1. Unchanged position of left nephrostomy tube with near complete resolution of left hydronephrosis. Persistent left periureteric edema with 5 mm stone at the left ureterovesicular junction. 2. Questionable developing low-density phlegmon/abscess in the inferior left kidney, poorly defined in the absence of IV contrast. Left  perinephric edema has increased. 3. Small bilateral pleural effusions, left greater than right, slight increase in size from prior exam. 4. Hepatic steatosis. Electronically Signed   By: Keith Rake M.D.   On: 11/03/2018 00:12   Dg Chest Port 1 View  Result Date: 11/02/2018 CLINICAL DATA:  New onset fevers EXAM: PORTABLE CHEST 1 VIEW COMPARISON:  10/29/2018 FINDINGS: Cardiac shadow is prominent accentuated by the portable technique. Increasing density in the left base is noted likely related to underlying atelectasis. No other focal abnormality is seen. IMPRESSION: Increasing parenchymal opacity in the left base consistent with increasing atelectasis. Electronically Signed   By: Inez Catalina M.D.   On: 11/02/2018 12:31   Ir Nephrostomy Exchange Left  Result Date: 11/04/2018 INDICATION: 48 year old female with a history of prior left-sided percutaneous nephrostomy EXAM: IR EXCHANGE NEPHROSTOMY LEFT COMPARISON:  None. MEDICATIONS: None ANESTHESIA/SEDATION: Fentanyl 75 mcg IV; Versed 1.5 mg IV Moderate Sedation Time:  12 minutes The patient was continuously monitored during the procedure by the interventional radiology nurse under my direct supervision. CONTRAST:  54mL OMNIPAQUE IOHEXOL 300 MG/ML SOLN - administered into the collecting system(s) FLUOROSCOPY TIME:  Fluoroscopy Time: 1 minutes 18 seconds (13 mGy). COMPLICATIONS: None PROCEDURE: Informed written consent was obtained from the patient after a thorough discussion of the procedural risks, benefits and alternatives. All questions were addressed. Maximal Sterile Barrier Technique was utilized including caps, mask, sterile gowns, sterile gloves, sterile drape, hand hygiene and skin antiseptic. A timeout was performed prior to the initiation of the  procedure. Patient position prone position on the fluoroscopy table the left flank was prepped and draped in the usual sterile fashion. 1% lidocaine was used for local anesthesia. The indwelling 10 French  catheter was then exchanged over a wire using modified Seldinger technique for a new 10 French Dawson Mueller tube formed in the lower pole collecting system of this partially duplicated collecting system. Small amount of contrast confirmed location. Catheter was sutured in position and attached to gravity drainage. Patient tolerated the procedure well and remained hemodynamically stable throughout. No complications were encountered and no significant blood loss. IMPRESSION: Status post routine exchange of left-sided percutaneous nephrostomy. Signed, Dulcy Fanny. Dellia Nims, RPVI [facility] Electronically Signed   By: Corrie Mckusick D.O.   On: 11/04/2018 08:14    Labs:  CBC: Recent Labs    11/01/18 0111 11/02/18 0412 11/03/18 0614 11/04/18 0745  WBC 9.2 10.8* 13.2* 13.0*  HGB 12.7 11.1* 10.1* 10.5*  HCT 38.2 33.1* 29.6* 32.0*  PLT 68* 118* 193 266    COAGS: Recent Labs    10/29/18 1443  INR 1.4*  APTT 29    BMP: Recent Labs    11/01/18 0111 11/02/18 0412 11/03/18 0614 11/04/18 0745  NA 137 137 136 138  K 3.7 3.3* 3.3* 3.5  CL 108 109 108 108  CO2 20* 21* 20* 21*  GLUCOSE 97 110* 94 106*  BUN 12 9 5* 5*  CALCIUM 8.0* 7.4* 7.7* 7.9*  CREATININE 1.21* 1.22* 0.99 0.95  GFRNONAA 54* 53* >60 >60  GFRAA >60 >60 >60 >60    LIVER FUNCTION TESTS: Recent Labs    05/24/18 1413 10/27/18 0750 10/29/18 1443 10/30/18 0017 11/01/18 0111 11/02/18 0412 11/03/18 0614 11/04/18 0745  BILITOT 0.3 0.8 1.2 1.8*  --   --   --   --   AST 18 18 25 26   --   --   --   --   ALT 13 15 20 18   --   --   --   --   ALKPHOS 63 49 71 117  --   --   --   --   PROT 7.9 7.2 6.1* 5.2*  --   --   --   --   ALBUMIN 3.8 3.8 2.8* 2.1* 1.9* 1.7* 1.7* 1.9*    Assessment and Plan: Pt with urosepsis /obst nephrolithiasis/pyelonephritis, s/p left PCN 9/27with exchange 10/2; output ok; also with ? developing abscess inf left kidney/left perinephric  edema; afebrile; WBC 13.0, hgb 10.5, creat nl; latest urine cx neg; most recent blood cx pend; would consider f/u CT A/P with IV contrast if no improvement in sx's and elevated WBC persists   Electronically Signed: D. Rowe Robert, PA-C 11/04/2018, 2:09 PM   I spent a total of 20 minutes at the the patient's bedside AND on the patient's hospital floor or unit, greater than 50% of which was counseling/coordinating care for left nephrostomy    Patient ID: Tamara Watson, female   DOB: 1970/09/17, 48 y.o.   MRN: [id number]

## 2018-11-04 NOTE — Progress Notes (Signed)
Urology Inpatient Progress Report  Kidney stone [N20.0] Tachycardia [R00.0] Sepsis ([place]) [A41.9] Left ureteral stone [N20.1] Sepsis, due to unspecified organism, unspecified whether acute organ dysfunction present ([place]) [A41.9] Sepsis with acute renal failure and renal cortical necrosis without septic shock, due to unspecified organism ([place]) [A41.9, R65.20, N17.1] Sepsis with acute organ dysfunction, due to unspecified organism, unspecified type, unspecified whether septic shock present ([place]) [A41.9, R65.20] Pyelonephritis [N12]        Intv/Subj: No acute events overnight. Patient continues to have right-sided abdominal pain as well as nausea and generally not feeling well.  Yesterday, she underwent a left nephrostomy tube exchange.  She also underwent a CT scan of the abdomen and pelvis on 11/02/2018 that showed persistent left perinephric edema with a 5 mm stone in the left ureterovesicular junction there was a questionable developing low-density phlegmon/abscess in the inferior left kidney that was poorly defined.  Her left perinephric edema has increased.  She had small bilateral pleural effusions.  She continues on ceftriaxone.  Repeat blood cultures are negative x2 days.  Active Problems:   Sepsis ([place])   Pyelonephritis   Left ureteral stone   Tachycardia   Fever   Renal abscess, left  Current Facility-Administered Medications  Medication Dose Route Frequency Provider Last Rate Last Dose  . 0.9 %  sodium chloride infusion  250 mL Intravenous PRN Bowser, Laurel Dimmer, NP      . 0.9 %  sodium chloride infusion   Intravenous Continuous Kathrene Alu, MD 125 mL/hr at 11/04/18 0300    . acetaminophen (TYLENOL) tablet 1,000 mg  1,000 mg Oral Q6H Stark Klein, MD   1,000 mg at 11/04/18 1015  . benzonatate (TESSALON) capsule 100 mg  100 mg Oral TID PRN Lyndee Hensen, MD      . cefTRIAXone (ROCEPHIN) 2 g in sodium chloride 0.9 % 100 mL IVPB  2 g Intravenous Q24H Cleon Gustin, MD 200 mL/hr at 11/03/18 2206 2 g at 11/03/18 2206  . Chlorhexidine Gluconate Cloth 2 % PADS 6 each  6 each Topical Daily Icard, Bradley L, DO   6 each at 11/03/18 1147  . fluticasone (FLONASE) 50 MCG/ACT nasal spray 2 spray  2 spray Each Nare Daily PRN Lyndee Hensen, MD      . HYDROmorphone (DILAUDID) injection 1 mg  1 mg Intravenous Q2H PRN Rory Percy, DO   1 mg at 11/03/18 2049  . ibuprofen (ADVIL) tablet 600 mg  600 mg Oral Q6H PRN Rory Percy, DO   600 mg at 11/04/18 0407  . MEDLINE mouth rinse  15 mL Mouth Rinse BID Tyna Jaksch, MD   15 mL at 11/02/18 2128  . nystatin (MYCOSTATIN) 100000 UNIT/ML suspension 500,000 Units  5 mL Mouth/Throat QID Mullis, Kiersten P, DO   500,000 Units at 11/03/18 2158  . ondansetron (ZOFRAN) tablet 4 mg  4 mg Oral Q6H PRN Stark Klein, MD       Or  . ondansetron [facility]) injection 4 mg  4 mg Intravenous Q6H PRN Stark Klein, MD   4 mg at 11/04/18 1015  . oxyCODONE (Oxy IR/ROXICODONE) immediate release tablet 5 mg  5 mg Oral Q4H PRN Rory Percy, DO   5 mg at 11/04/18 0305  . pantoprazole (PROTONIX) EC tablet 40 mg  40 mg Oral Daily Lyndee Hensen, MD   40 mg at 11/03/18 0940  . polyethylene glycol (MIRALAX / GLYCOLAX) packet 17 g  17 g Oral Daily Winfrey, Alcario Drought, MD      .  senna-docusate (Senokot-S) tablet 2 tablet  2 tablet Oral QHS Mullis, Kiersten P, DO   2 tablet at 11/02/18 2128  . sodium chloride flush (NS) 0.9 % injection 3 mL  3 mL Intravenous Q12H Bowser, Laurel Dimmer, NP   3 mL at 11/03/18 0942  . sodium chloride flush (NS) 0.9 % injection 3 mL  3 mL Intravenous PRN Bowser, Laurel Dimmer, NP      . sodium chloride flush (NS) 0.9 % injection 5 mL  5 mL Intracatheter Q8H Corrie Mckusick, DO   5 mL at 11/04/18 0555  . tamsulosin (FLOMAX) capsule 0.4 mg  0.4 mg Oral Daily Lyndee Hensen, MD   0.4 mg at 11/03/18 0938     Objective: Vital: Vitals:   11/04/18 0500 11/04/18 0842 11/04/18 1014 11/04/18 1226  BP:  134/75  (!) 154/80   Pulse:  80  75  Resp:  19  12  Temp:  99.1 F (37.3 C) 98.8 F (37.1 C) (!) 97.3 F (36.3 C)  TempSrc:  Oral Oral Axillary  SpO2:  100%  100%  Weight: 101.2 kg     Height:       I/Os: I/O last 3 completed shifts: In: 3354.3 [I.V.:3079.3; Other:175; IV Piggyback:100] Out: [id number] [Urine:5710; Stool:2]  Physical Exam:  General: Patient is in no apparent distress Lungs: Normal respiratory effort, chest expands symmetrically. GI: The abdomen is soft and mildly tender without mass. Left nephrostomy tube draining clear yellow urine Ext: lower extremities symmetric  Lab Results: Recent Labs    11/02/18 0412 11/03/18 0614 11/04/18 0745  WBC 10.8* 13.2* 13.0*  HGB 11.1* 10.1* 10.5*  HCT 33.1* 29.6* 32.0*   Recent Labs    11/02/18 0412 11/03/18 0614 11/04/18 0745  NA 137 136 138  K 3.3* 3.3* 3.5  CL 109 108 108  CO2 21* 20* 21*  GLUCOSE 110* 94 106*  BUN 9 5* 5*  CREATININE 1.22* 0.99 0.95  CALCIUM 7.4* 7.7* 7.9*   No results for input(s): LABPT, INR in the last 72 hours. No results for input(s): LABURIN in the last 72 hours. Results for orders placed or performed during the hospital encounter of 10/29/18  SARS Coronavirus 2 [facility] order, Performed in [facility] hospital lab) Nasopharyngeal Nasopharyngeal Swab     Status: None   Collection Time: 10/29/18  2:09 PM   Specimen: Nasopharyngeal Swab  Result Value Ref Range Status   SARS Coronavirus 2 NEGATIVE NEGATIVE Final    Comment: (NOTE) If result is NEGATIVE SARS-CoV-2 target nucleic acids are NOT DETECTED. The SARS-CoV-2 RNA is generally detectable in upper and lower  respiratory specimens during the acute phase of infection. The lowest  concentration of SARS-CoV-2 viral copies this assay can detect is 250  copies / mL. A negative result does not preclude SARS-CoV-2 infection  and should not be used as the sole basis for treatment or other  patient management decisions.  A negative result may occur with   improper specimen collection / handling, submission of specimen other  than nasopharyngeal swab, presence of viral mutation(s) within the  areas targeted by this assay, and inadequate number of viral copies  (<250 copies / mL). A negative result must be combined with clinical  observations, patient history, and epidemiological information. If result is POSITIVE SARS-CoV-2 target nucleic acids are DETECTED. The SARS-CoV-2 RNA is generally detectable in upper and lower  respiratory specimens dur ing the acute phase of infection.  Positive  results are indicative of active infection with SARS-CoV-2.  Clinical  correlation with patient history and other diagnostic information is  necessary to determine patient infection status.  Positive results do  not rule out bacterial infection or co-infection with other viruses. If result is PRESUMPTIVE POSTIVE SARS-CoV-2 nucleic acids MAY BE PRESENT.   A presumptive positive result was obtained on the submitted specimen  and confirmed on repeat testing.  While 2019 novel coronavirus  (SARS-CoV-2) nucleic acids may be present in the submitted sample  additional confirmatory testing may be necessary for epidemiological  and / or clinical management purposes  to differentiate between  SARS-CoV-2 and other Sarbecovirus currently known to infect humans.  If clinically indicated additional testing with an alternate test  methodology [phone]) is advised. The SARS-CoV-2 RNA is generally  detectable in upper and lower respiratory sp ecimens during the acute  phase of infection. The expected result is Negative. Fact Sheet for Patients:  StrictlyIdeas.no Fact Sheet for Healthcare Providers: BankingDealers.co.za This test is not yet approved or cleared by the [place] FDA and has been authorized for detection and/or diagnosis of SARS-CoV-2 by FDA under an Emergency Use Authorization (EUA).  This EUA will  remain in effect (meaning this test can be used) for the duration of the COVID-19 declaration under Section 564(b)(1) of the Act, 21 U.S.C. section 360bbb-3(b)(1), unless the authorization is terminated or revoked sooner. Performed at [facility], [place] [address], [place], [place] [address]   Urine culture     Status: Abnormal   Collection Time: 10/29/18  2:21 PM   Specimen: In/Out Cath Urine  Result Value Ref Range Status   Specimen Description IN/OUT CATH URINE  Final   Special Requests   Final    NONE Performed at [facility], [place] [address], [place], [place] [address]    Culture 50,000 COLONIES/mL KLEBSIELLA PNEUMONIAE (A)  Final   Report Status 10/31/2018 FINAL  Final   Organism ID, Bacteria KLEBSIELLA PNEUMONIAE (A)  Final      Susceptibility   Klebsiella pneumoniae - MIC*    AMPICILLIN >=32 RESISTANT Resistant     CEFAZOLIN <=4 SENSITIVE Sensitive     CEFTRIAXONE <=1 SENSITIVE Sensitive     CIPROFLOXACIN <=0.25 SENSITIVE Sensitive     GENTAMICIN <=1 SENSITIVE Sensitive     IMIPENEM 1 SENSITIVE Sensitive     NITROFURANTOIN 128 RESISTANT Resistant     TRIMETH/SULFA <=20 SENSITIVE Sensitive     AMPICILLIN/SULBACTAM 4 SENSITIVE Sensitive     PIP/TAZO <=4 SENSITIVE Sensitive     Extended ESBL NEGATIVE Sensitive     * 50,000 COLONIES/mL KLEBSIELLA PNEUMONIAE  Blood Culture (routine x 2)     Status: Abnormal   Collection Time: 10/29/18  2:26 PM   Specimen: BLOOD  Result Value Ref Range Status   Specimen Description BLOOD RIGHT ANTECUBITAL  Final   Special Requests   Final    BOTTLES DRAWN AEROBIC AND ANAEROBIC Blood Culture adequate volume   Culture  Setup Time   Final    GRAM NEGATIVE RODS IN BOTH AEROBIC AND ANAEROBIC BOTTLES CRITICAL RESULT CALLED TO, READ BACK BY AND VERIFIED WITH: T. EGAN,PHARMD 0423 10/30/2018 T. TYSOR    Culture (A)  Final    KLEBSIELLA PNEUMONIAE SUSCEPTIBILITIES PERFORMED ON PREVIOUS CULTURE WITHIN THE LAST 5 DAYS. Performed at  [facility], [place] [address], [place], [place] [address]    Report Status 11/01/2018 FINAL  Final  Blood Culture (routine x 2)     Status: Abnormal   Collection Time:  10/29/18  3:23 PM   Specimen: BLOOD  Result Value Ref Range Status   Specimen Description BLOOD LEFT ANTECUBITAL  Final   Special Requests   Final    BOTTLES DRAWN AEROBIC AND ANAEROBIC Blood Culture results may not be optimal due to an inadequate volume of blood received in culture bottles   Culture  Setup Time   Final    GRAM NEGATIVE RODS IN BOTH AEROBIC AND ANAEROBIC BOTTLES CRITICAL RESULT CALLED TO, READ BACK BY AND VERIFIED WITH: TDonzetta Kohut [id number] 10/30/2018 Mena Goes Performed at [facility], [place] [address], [place], [place] [address]    Culture KLEBSIELLA PNEUMONIAE (A)  Final   Report Status 11/01/2018 FINAL  Final   Organism ID, Bacteria KLEBSIELLA PNEUMONIAE  Final      Susceptibility   Klebsiella pneumoniae - MIC*    AMPICILLIN >=32 RESISTANT Resistant     CEFAZOLIN <=4 SENSITIVE Sensitive     CEFEPIME <=1 SENSITIVE Sensitive     CEFTAZIDIME <=1 SENSITIVE Sensitive     CEFTRIAXONE <=1 SENSITIVE Sensitive     CIPROFLOXACIN <=0.25 SENSITIVE Sensitive     GENTAMICIN <=1 SENSITIVE Sensitive     IMIPENEM 0.5 SENSITIVE Sensitive     TRIMETH/SULFA <=20 SENSITIVE Sensitive     AMPICILLIN/SULBACTAM 4 SENSITIVE Sensitive     PIP/TAZO <=4 SENSITIVE Sensitive     Extended ESBL NEGATIVE Sensitive     * KLEBSIELLA PNEUMONIAE  Blood Culture ID Panel (Reflexed)     Status: Abnormal   Collection Time: 10/29/18  3:23 PM  Result Value Ref Range Status   Enterococcus species NOT DETECTED NOT DETECTED Final   Listeria monocytogenes NOT DETECTED NOT DETECTED Final   Staphylococcus species NOT DETECTED NOT DETECTED Final   Staphylococcus aureus (BCID) NOT DETECTED NOT DETECTED Final   Streptococcus species NOT DETECTED NOT DETECTED Final   Streptococcus agalactiae NOT DETECTED NOT DETECTED Final    Streptococcus pneumoniae NOT DETECTED NOT DETECTED Final   Streptococcus pyogenes NOT DETECTED NOT DETECTED Final   Acinetobacter baumannii NOT DETECTED NOT DETECTED Final   Enterobacteriaceae species DETECTED (A) NOT DETECTED Final    Comment: Enterobacteriaceae represent a large family of gram-negative bacteria, not a single organism. CRITICAL RESULT CALLED TO, READ BACK BY AND VERIFIED WITH: T. EGAN,PHARMD 0423 10/30/2018 T. TYSOR    Enterobacter cloacae complex NOT DETECTED NOT DETECTED Final   Escherichia coli NOT DETECTED NOT DETECTED Final   Klebsiella oxytoca NOT DETECTED NOT DETECTED Final   Klebsiella pneumoniae DETECTED (A) NOT DETECTED Final    Comment: CRITICAL RESULT CALLED TO, READ BACK BY AND VERIFIED WITH: T. EGAN,PHARMD 0423 10/30/2018 T. TYSOR    Proteus species NOT DETECTED NOT DETECTED Final   Serratia marcescens NOT DETECTED NOT DETECTED Final   Carbapenem resistance NOT DETECTED NOT DETECTED Final   Haemophilus influenzae NOT DETECTED NOT DETECTED Final   Neisseria meningitidis NOT DETECTED NOT DETECTED Final   Pseudomonas aeruginosa NOT DETECTED NOT DETECTED Final   Candida albicans NOT DETECTED NOT DETECTED Final   Candida glabrata NOT DETECTED NOT DETECTED Final   Candida krusei NOT DETECTED NOT DETECTED Final   Candida parapsilosis NOT DETECTED NOT DETECTED Final   Candida tropicalis NOT DETECTED NOT DETECTED Final    Comment: Performed at [facility], [place]. [address] [place], [place] [address]  Urine Culture     Status: Abnormal   Collection Time: 10/29/18  7:09 PM   Specimen: Kidney, Left; Urine  Result Value Ref Range Status  Specimen Description KIDNEY LEFT URINE  Final   Special Requests   Final    A Performed at [facility], [place] [address] [place], [place] [address]    Culture >=100,000 COLONIES/mL KLEBSIELLA PNEUMONIAE (A)  Final   Report Status 10/31/2018 FINAL  Final   Organism ID, Bacteria KLEBSIELLA PNEUMONIAE (A)  Final       Susceptibility   Klebsiella pneumoniae - MIC*    AMPICILLIN >=32 RESISTANT Resistant     CEFAZOLIN <=4 SENSITIVE Sensitive     CEFEPIME <=1 SENSITIVE Sensitive     CEFTAZIDIME <=1 SENSITIVE Sensitive     CEFTRIAXONE <=1 SENSITIVE Sensitive     CIPROFLOXACIN <=0.25 SENSITIVE Sensitive     GENTAMICIN <=1 SENSITIVE Sensitive     IMIPENEM 0.5 SENSITIVE Sensitive     TRIMETH/SULFA <=20 SENSITIVE Sensitive     AMPICILLIN/SULBACTAM 4 SENSITIVE Sensitive     PIP/TAZO <=4 SENSITIVE Sensitive     Extended ESBL NEGATIVE Sensitive     * >=100,000 COLONIES/mL KLEBSIELLA PNEUMONIAE  MRSA PCR Screening     Status: None   Collection Time: 10/30/18  8:30 AM   Specimen: Nasopharyngeal  Result Value Ref Range Status   MRSA by PCR NEGATIVE NEGATIVE Final    Comment:        The GeneXpert MRSA Assay (FDA approved for NASAL specimens only), is one component of a comprehensive MRSA colonization surveillance program. It is not intended to diagnose MRSA infection nor to guide or monitor treatment for MRSA infections. Performed at [facility], [place] [address] [place], [place] [address]   Culture, blood (routine x 2)     Status: None (Preliminary result)   Collection Time: 11/02/18 12:41 PM   Specimen: BLOOD  Result Value Ref Range Status   Specimen Description BLOOD RIGHT ANTECUBITAL  Final   Special Requests   Final    BOTTLES DRAWN AEROBIC AND ANAEROBIC Blood Culture adequate volume   Culture   Final    NO GROWTH 2 DAYS Performed at [facility], [address], [place], [place] [address]    Report Status PENDING  Incomplete  Culture, blood (routine x 2)     Status: None (Preliminary result)   Collection Time: 11/02/18 12:41 PM   Specimen: BLOOD  Result Value Ref Range Status   Specimen Description BLOOD LEFT ANTECUBITAL  Final   Special Requests   Final    BOTTLES DRAWN AEROBIC AND ANAEROBIC Blood Culture adequate volume   Culture   Final    NO GROWTH 2 DAYS Performed at  [facility], [place] [address] [place], [place] [address]    Report Status PENDING  Incomplete  Culture, Urine     Status: None   Collection Time: 11/03/18 12:10 AM   Specimen: Urine, Random  Result Value Ref Range Status   Specimen Description URINE, RANDOM  Final   Special Requests NONE  Final   Culture   Final    NO GROWTH Performed at [facility], [place] [address], [place], [place] [address]    Report Status 11/03/2018 FINAL  Final    Studies/Results: Ct Abdomen Pelvis Wo Contrast  Result Date: 11/03/2018 CLINICAL DATA:  Complicated pyelonephritis. Left nephrostomy tube in place draining blood tinged fluid. EXAM: CT ABDOMEN AND PELVIS WITHOUT CONTRAST TECHNIQUE: Multidetector CT imaging of the abdomen and pelvis was performed following the standard protocol without IV contrast. COMPARISON:  CT 10/30/2018, 10/29/2018, additional priors. FINDINGS: Lower chest: Small  bilateral pleural effusions, left greater than right, slight increase from prior exam. Associated atelectasis. Hepatobiliary: Hepatic steatosis. Gallbladder physiologically distended, no calcified stone. No biliary dilatation. Pancreas: No ductal dilatation or inflammation. Spleen: Normal in size without focal abnormality. Adrenals/Urinary Tract: Normal adrenal glands. Left nephrostomy tube unchanged position with tip in the renal pelvis. No abnormality along the course of the nephrostomy tubing. Left hydronephrosis has near completely resolved. Persistent and slight worsening left perinephric edema. Questionable developing low-density region in the inferior left kidney, series 3, image 37. There is persistent left periureteric edema with 5 mm stone at the left ureterovesicular junction. Urinary bladder physiologically distended. No bladder stone. No right hydronephrosis or urolithiasis. Stomach/Bowel: Stomach is within normal limits. Appendix appears normal. No evidence of bowel wall thickening, distention, or inflammatory  changes. Administered enteric contrast reaches the distal colon. Vascular/Lymphatic: Abdominal aorta is normal in caliber. Probable small left retroperitoneal nodes not well assessed. No bulky abdominopelvic adenopathy. Reproductive: 2.8 cm cyst in the right ovary is unchanged in size, probable fluid level suggesting hemorrhagic cyst. Left ovary is unremarkable. Post hysterectomy. Other: Mild generalized body wall edema. No free fluid or free air in the abdomen or pelvis. Musculoskeletal: There are no acute or suspicious osseous abnormalities. IMPRESSION: 1. Unchanged position of left nephrostomy tube with near complete resolution of left hydronephrosis. Persistent left periureteric edema with 5 mm stone at the left ureterovesicular junction. 2. Questionable developing low-density phlegmon/abscess in the inferior left kidney, poorly defined in the absence of IV contrast. Left perinephric edema has increased. 3. Small bilateral pleural effusions, left greater than right, slight increase in size from prior exam. 4. Hepatic steatosis. Electronically Signed   By: Keith Rake M.D.   On: 11/03/2018 00:12   Ir Nephrostomy Exchange Left  Result Date: 11/04/2018 INDICATION: 48 year old female with a history of prior left-sided percutaneous nephrostomy EXAM: IR EXCHANGE NEPHROSTOMY LEFT COMPARISON:  None. MEDICATIONS: None ANESTHESIA/SEDATION: Fentanyl 75 mcg IV; Versed 1.5 mg IV Moderate Sedation Time:  12 minutes The patient was continuously monitored during the procedure by the interventional radiology nurse under my direct supervision. CONTRAST:  40mL OMNIPAQUE IOHEXOL 300 MG/ML SOLN - administered into the collecting system(s) FLUOROSCOPY TIME:  Fluoroscopy Time: 1 minutes 18 seconds (13 mGy). COMPLICATIONS: None PROCEDURE: Informed written consent was obtained from the patient after a thorough discussion of the procedural risks, benefits and alternatives. All questions were addressed. Maximal Sterile Barrier  Technique was utilized including caps, mask, sterile gowns, sterile gloves, sterile drape, hand hygiene and skin antiseptic. A timeout was performed prior to the initiation of the procedure. Patient position prone position on the fluoroscopy table the left flank was prepped and draped in the usual sterile fashion. 1% lidocaine was used for local anesthesia. 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 Small amount of contrast confirmed location. Catheter was sutured in position and attached to gravity drainage. Patient tolerated the procedure well and remained hemodynamically stable throughout. No complications were encountered and no significant blood loss. IMPRESSION: Status post routine exchange of left-sided percutaneous nephrostomy. Signed, Dulcy Fanny. Dellia Nims, RPVI [facility] Electronically Signed   By: Corrie Mckusick D.O.   On: 11/04/2018 08:14    Assessment: Left ureteral calculus Sepsis secondary to urinary tract infection  Plan: Agree with current plan of management by continuing  nephrostomy tube to gravity drainage and continuing ceftriaxone.  Creatinine is normal.  Therefore, if condition does not improve, we can consider a CT of the abdomen and pelvis with IV contrast to rule out formation of abscess.   Link Snuffer, MD Urology 11/04/2018, 12:38 PM

## 2018-11-04 NOTE — Plan of Care (Signed)
Family medicine teaching service pickup in a.m. on 10/4 see [place] communication.

## 2018-11-04 NOTE — Progress Notes (Addendum)
Family Medicine Teaching Service Daily Progress Note Intern Pager: [phone]  Patient name: Tamara Watson Medical record number: [id number] Date of birth: August 31, 1970 Age: 48 y.o. Gender: female  Primary Care Provider: Haywood Pao, MD Consultants: Urology  Code Status: Full Code   Pt Overview and Major Events to Date:   Antibiotics: Vancomycin 9/27 Cefepime 9/27 Ceftriaxone 9/28-9/29 Cefazolin 9/29-10/1 CTX 10/1-  11/02/18 - Abdominal/Pelvic CT showed left renal phlegmon/abscess  11/03/18 - Nephrostomy Drain Exchange   Assessment and Plan: Tamara Watson a 48 y.o.female whopresented with presumed sepsis with lactic acidosis most likely due to ureteral stone, now with nephrostomy tube due to formation of left renal phlegmon/abscess. PMH is significant for history of anemia due to menorrhagiaand fibroids (s/p partialhysterectomy5/2020), anxiety, constipation, GERD, and seasonal allergies.  Sepsis due to Klebsiella bacteremia secondary to obstructing nephrolithiasis S/p Left Nephrostomy Tube exchange  Left renal phlegmon/abscess  nausea Patient reports  states she feels "awful, feverish and nauseous". Patient requesting to have her care transferred [facility] as she follows with one of their physicians as outpatient. [facility] has agreed proceed with patient care on 11/05/18. Patient was afebrile overnight following her drain exchange for nephrostomy tube.  Patient has last fever noted to be 100.4 at 11:24 on the morning of 10/2.  On physical exam patient does not feel warm to touch temp is measured at 99.5, has no warmth or surrounding erythema in area near nephrostomy tube entrance, patient has normal work of breathing and there are no crackles or decreased breath sounds appreciated in bilateral lung fields, patient is stable on room air. WBC minimally down trend to 13 from 13.2.  -continue CTX 2g daily(Day 7 of 14 day total antibiotic  course) -Patient on oxycodone, Tylenol, Dilaudid for pain management - Regular diet, monitor p.o. intake -Continue IVfluids,125 mL/hr NS -Continue Miralax and Sennaas needed for narcotic induced constipation -Continue Flomax 0.4mg  Continue to monitor urine and blood cultures (blood cultures negative at this time, urine culture with no growth) -will continue to monitor fever curve -increased Zofran to 8mg  q 6 hours    AKI, resolved Creatinine  continues to down trend to 0.95 from 0.99. Currently on NS infusion at 125 mL/h. Intake 2126 overnight with output of 3801, down net 1.6 liters. Patient has been able to ambulate to restroom independently.  -Continue to monitorDaily BMP -continue IV fluids   Tachycardia HR appears to be normalizing, max HR overnight recorded at 101 for one episode. -continue to monitor with vitals   FEN/GI: regular diet PPx: protonix  Disposition: discharge pending medically improvement   Subjective:  Patient reports feeling feverish and has continued nausea. Objective: Temp:  [97.8 F (36.6 C)-100.4 F (38 C)] 98.8 F (37.1 C) (10/03 1014) Pulse Rate:  [80-101] 80 (10/03 0842) Resp:  [16-26] 19 (10/03 0842) BP: (113-157)/(63-90) 134/75 (10/03 0842) SpO2:  [98 %-100 %] 100 % (10/03 0842) Weight:  [101.2 kg] 101.2 kg (10/03 0500)  Physical Exam: General: Female uncomfortable appearing lying in bed Cardiovascular: Regular rate and rhythm without murmurs or gallops, cap refill less than 2 seconds Respiratory: Clear to auscultation bilaterally without decreased breath sounds or wheezing or crackles, patient is stable on room air Abdomen: Bowel sounds present no tenderness to palpation abdomen is soft Extremities: No lower extremity edema appreciated  Laboratory: Recent Labs  Lab 11/02/18 0412 11/03/18 0614 11/04/18 0745  WBC 10.8* 13.2* 13.0*  HGB 11.1* 10.1* 10.5*  HCT 33.1* 29.6* 32.0*  PLT 118* 193 266  Recent Labs  Lab  10/29/18 1443 10/30/18 0017  11/02/18 0412 11/03/18 0614 11/04/18 0745  NA 132* 137   < > 137 136 138  K 3.1* 3.1*   < > 3.3* 3.3* 3.5  CL 100 108   < > 109 108 108  CO2 18* 19*   < > 21* 20* 21*  BUN 18 18   < > 9 5* 5*  CREATININE 1.53* 1.56*   < > 1.22* 0.99 0.95  CALCIUM 7.9* 7.1*   < > 7.4* 7.7* 7.9*  PROT 6.1* 5.2*  --   --   --   --   BILITOT 1.2 1.8*  --   --   --   --   ALKPHOS 71 117  --   --   --   --   ALT 20 18  --   --   --   --   AST 25 26  --   --   --   --   GLUCOSE 117* 107*   < > 110* 94 106*   < > = values in this interval not displayed.    Stark Klein, MD 11/04/2018, 11:10 AM PGY-1, [place] Intern pager: [phone], text pages welcome

## 2018-11-04 NOTE — Progress Notes (Signed)
OT Cancellation Note  Patient Details Name: Tamara Watson MRN: [id number] DOB: 1970-05-17   Cancelled Treatment:    Reason Eval/Treat Not Completed: Other (comment) Met with pt bedside, pt again refused stating that she does not feel well and that her fever is coming back. Will continue to follow as pt available and appropriate to initiate OT POC.  Zenovia Jarred, MSOT, OTR/L Behavioral Health OT/ Acute Relief OT [facility] Office: [phone]   Zenovia Jarred 11/04/2018, 12:29 PM

## 2018-11-05 LAB — CBC WITH DIFFERENTIAL/PLATELET
Abs Immature Granulocytes: 0.25 10*3/uL — ABNORMAL HIGH (ref 0.00–0.07)
Basophils Absolute: 0 10*3/uL (ref 0.0–0.1)
Basophils Relative: 0 %
Eosinophils Absolute: 0 10*3/uL (ref 0.0–0.5)
Eosinophils Relative: 0 %
HCT: 28.2 % — ABNORMAL LOW (ref 36.0–46.0)
Hemoglobin: 9.7 g/dL — ABNORMAL LOW (ref 12.0–15.0)
Immature Granulocytes: 2 %
Lymphocytes Relative: 11 %
Lymphs Abs: 1.3 10*3/uL (ref 0.7–4.0)
MCH: 30.2 pg (ref 26.0–34.0)
MCHC: 34.4 g/dL (ref 30.0–36.0)
MCV: 87.9 fL (ref 80.0–100.0)
Monocytes Absolute: 0.9 10*3/uL (ref 0.1–1.0)
Monocytes Relative: 8 %
Neutro Abs: 9.3 10*3/uL — ABNORMAL HIGH (ref 1.7–7.7)
Neutrophils Relative %: 79 %
Platelets: 361 10*3/uL (ref 150–400)
RBC: 3.21 MIL/uL — ABNORMAL LOW (ref 3.87–5.11)
RDW: 14.1 % (ref 11.5–15.5)
WBC: 11.8 10*3/uL — ABNORMAL HIGH (ref 4.0–10.5)
nRBC: 0.3 % — ABNORMAL HIGH (ref 0.0–0.2)

## 2018-11-05 MED ORDER — PROMETHAZINE HCL 25 MG/ML IJ SOLN
12.5000 mg | Freq: Four times a day (QID) | INTRAMUSCULAR | Status: DC | PRN
  Administered 2018-11-08: 14:00:00 12.5 mg via INTRAVENOUS
  Filled 2018-11-05: qty 1

## 2018-11-05 MED ORDER — PROMETHAZINE HCL 12.5 MG RE SUPP
12.5000 mg | Freq: Four times a day (QID) | RECTAL | Status: DC | PRN
  Filled 2018-11-05: qty 1

## 2018-11-05 MED ORDER — PROMETHAZINE HCL 25 MG PO TABS
12.5000 mg | ORAL_TABLET | Freq: Four times a day (QID) | ORAL | Status: DC | PRN
  Filled 2018-11-05: qty 1

## 2018-11-05 NOTE — Progress Notes (Signed)
PROGRESS NOTE    Tamara Watson  [id number] DOB: [birth year] DOA: 10/29/2018 PCP: Haywood Pao, MD   Brief Narrative: Tamara Watson is a 48 y.o. female that presented secondary to sepsis in setting of pylonephritis and bacteremia from klebsiella pneumoniae infection. On antibiotics. S/p perc drain.   Assessment & Plan:   Active Problems:   Sepsis ([place])   Pyelonephritis   Left ureteral stone   Tachycardia   Fever   Renal abscess, left   Nausea   Sepsis Secondary to Klebsiella bacteremia and pyelonephritis. Present on admission.  Bacteremia Klebsiella pneumoniae on 9/27 Bcx. Repeat blood cultures on 10/1 no growth to date. Initially treated with Vancomycin/cefepime, transitioned to cefazolin and now changed to Ceftriaxone. -Continue ceftriaxone  Pyelonephritis Renal abscess In setting of nephrolithiasis. Patient underwent left percutaneous nephrostomy tube insertion on 9/27 by IR. Tube exchanged on 10/2 by IR. Urology on board. -Continue antibiotics as mentioned above -Urology recommendations  Nausea/vomiting Somewhat improved. -Add phenergan prn -Continue Zofran and hydroxyzine prn  AKI Noted in chart. Does not meet criteria for AKI.    DVT prophylaxis: SCDs Code Status:   Code Status: Full Code Family Communication: None at bedside Disposition Plan: Discharge home likely in 2-3 days pending transition to oral antibiotics, improvement of oral intake, specialist recommendations   Consultants:   Interventional radiology  Urology  Procedures:   9/27: left percutaneous nephrostomy tube insertion  10/2: left percutaneous nephrostomy tube exchange  Antimicrobials:  Vancomycin  Cefepime  Ancef  Ceftriaxone    Subjective: Patient with some mild nausea. Pain is somewhat improved. No other issues overnight.  Objective: Vitals:   11/05/18 0204 11/05/18 0410 11/05/18 0500 11/05/18 0901  BP:  (!) 154/86  (!) 145/61  Pulse:  88 95  83  Resp: (!) 26 (!) 27  20  Temp:  98.6 F (37 C)  99.1 F (37.3 C)  TempSrc:  Axillary    SpO2: 100% 100%  99%  Weight:   99.2 kg   Height:        Intake/Output Summary (Last 24 hours) at 11/05/2018 1108 Last data filed at 11/05/2018 0800 Gross per 24 hour  Intake 2670.34 ml  Output 5875 ml  Net -3204.66 ml   Filed Weights   11/03/18 0500 11/04/18 0500 11/05/18 0500  Weight: 103.2 kg 101.2 kg 99.2 kg    Examination:  General exam: Appears calm and comfortable Respiratory system: Clear to auscultation. Respiratory effort normal. Cardiovascular system: S1 & S2 heard, RRR. No murmurs, rubs, gallops or clicks. Gastrointestinal system: Abdomen is nondistended, soft and nontender. No organomegaly or masses felt. Normal bowel sounds heard. Central nervous system: Alert and oriented. No focal neurological deficits. Extremities: 1+ LE edema. No calf tenderness Skin: No cyanosis. No rashes. Left perc drain dressing intact and clean Psychiatry: Judgement and insight appear normal. Mood & affect appropriate.     Data Reviewed: I have personally reviewed following labs and imaging studies  CBC: Recent Labs  Lab 11/01/18 0111 11/02/18 0412 11/03/18 0614 11/04/18 0745 11/05/18 0710  WBC 9.2 10.8* 13.2* 13.0* 11.8*  NEUTROABS  --   --  10.0* 10.4* 9.3*  HGB 12.7 11.1* 10.1* 10.5* 9.7*  HCT 38.2 33.1* 29.6* 32.0* 28.2*  MCV 87.2 87.8 87.6 89.1 87.9  PLT 68* 118* 193 266 [id number]   Basic Metabolic Panel: Recent Labs  Lab 10/30/18 0017 10/30/18 1042 10/31/18 0637 10/31/18 1023 11/01/18 0111 11/02/18 0412 11/03/18 0614 11/04/18 0745  NA 137 136  --  136 137 137 136 138  K 3.1* 4.6  --  3.8 3.7 3.3* 3.3* 3.5  CL 108 110  --  110 108 109 108 108  CO2 19* 20*  --  20* 20* 21* 20* 21*  GLUCOSE 107* 109*  --  111* 97 110* 94 106*  BUN 18 14  --  11 12 9  5* 5*  CREATININE 1.56* 1.18*  --  1.00 1.21* 1.22* 0.99 0.95  CALCIUM 7.1* 7.4*  --  7.9* 8.0* 7.4* 7.7* 7.9*  MG  1.3* 2.0  --   --   --   --   --   --   PHOS 2.0*  --  1.6*  --  2.5 2.4* 2.8 3.0   GFR: Estimated Creatinine Clearance: 88.1 mL/min (by C-G formula based on SCr of 0.95 mg/dL). Liver Function Tests: Recent Labs  Lab 10/29/18 1443 10/30/18 0017 11/01/18 0111 11/02/18 0412 11/03/18 0614 11/04/18 0745  AST 25 26  --   --   --   --   ALT 20 18  --   --   --   --   ALKPHOS 71 117  --   --   --   --   BILITOT 1.2 1.8*  --   --   --   --   PROT 6.1* 5.2*  --   --   --   --   ALBUMIN 2.8* 2.1* 1.9* 1.7* 1.7* 1.9*   Recent Labs  Lab 10/30/18 0017  LIPASE 16  AMYLASE 36   No results for input(s): AMMONIA in the last 168 hours. Coagulation Profile: Recent Labs  Lab 10/29/18 1443  INR 1.4*   Cardiac Enzymes: No results for input(s): CKTOTAL, CKMB, CKMBINDEX, TROPONINI in the last 168 hours. BNP (last 3 results) No results for input(s): PROBNP in the last 8760 hours. HbA1C: No results for input(s): HGBA1C in the last 72 hours. CBG: Recent Labs  Lab 10/30/18 0842  GLUCAP 85   Lipid Profile: No results for input(s): CHOL, HDL, LDLCALC, TRIG, CHOLHDL, LDLDIRECT in the last 72 hours. Thyroid Function Tests: No results for input(s): TSH, T4TOTAL, FREET4, T3FREE, THYROIDAB in the last 72 hours. Anemia Panel: No results for input(s): VITAMINB12, FOLATE, FERRITIN, TIBC, IRON, RETICCTPCT in the last 72 hours. Sepsis Labs: Recent Labs  Lab 10/29/18 1348 10/29/18 1726 10/29/18 2011 10/30/18 0017  LATICACIDVEN 3.2* 2.5* 4.4* 2.5*    Recent Results (from the past 240 hour(s))  SARS Coronavirus 2 [facility] order, Performed in [facility] hospital lab) Nasopharyngeal Nasopharyngeal Swab     Status: None   Collection Time: 10/29/18  2:09 PM   Specimen: Nasopharyngeal Swab  Result Value Ref Range Status   SARS Coronavirus 2 NEGATIVE NEGATIVE Final    Comment: (NOTE) If result is NEGATIVE SARS-CoV-2 target nucleic acids are NOT DETECTED. The SARS-CoV-2 RNA is generally  detectable in upper and lower  respiratory specimens during the acute phase of infection. The lowest  concentration of SARS-CoV-2 viral copies this assay can detect is 250  copies / mL. A negative result does not preclude SARS-CoV-2 infection  and should not be used as the sole basis for treatment or other  patient management decisions.  A negative result may occur with  improper specimen collection / handling, submission of specimen other  than nasopharyngeal swab, presence of viral mutation(s) within the  areas targeted by this assay, and inadequate number of viral copies  (<250 copies / mL). A negative result must be combined  with clinical  observations, patient history, and epidemiological information. If result is POSITIVE SARS-CoV-2 target nucleic acids are DETECTED. The SARS-CoV-2 RNA is generally detectable in upper and lower  respiratory specimens dur ing the acute phase of infection.  Positive  results are indicative of active infection with SARS-CoV-2.  Clinical  correlation with patient history and other diagnostic information is  necessary to determine patient infection status.  Positive results do  not rule out bacterial infection or co-infection with other viruses. If result is PRESUMPTIVE POSTIVE SARS-CoV-2 nucleic acids MAY BE PRESENT.   A presumptive positive result was obtained on the submitted specimen  and confirmed on repeat testing.  While 2019 novel coronavirus  (SARS-CoV-2) nucleic acids may be present in the submitted sample  additional confirmatory testing may be necessary for epidemiological  and / or clinical management purposes  to differentiate between  SARS-CoV-2 and other Sarbecovirus currently known to infect humans.  If clinically indicated additional testing with an alternate test  methodology [phone]) is advised. The SARS-CoV-2 RNA is generally  detectable in upper and lower respiratory sp ecimens during the acute  phase of infection. The  expected result is Negative. Fact Sheet for Patients:  StrictlyIdeas.no Fact Sheet for Healthcare Providers: BankingDealers.co.za This test is not yet approved or cleared by the [place] FDA and has been authorized for detection and/or diagnosis of SARS-CoV-2 by FDA under an Emergency Use Authorization (EUA).  This EUA will remain in effect (meaning this test can be used) for the duration of the COVID-19 declaration under Section 564(b)(1) of the Act, 21 U.S.C. section 360bbb-3(b)(1), unless the authorization is terminated or revoked sooner. Performed at [facility], [place] [address], [place], [place] [address]   Urine culture     Status: Abnormal   Collection Time: 10/29/18  2:21 PM   Specimen: In/Out Cath Urine  Result Value Ref Range Status   Specimen Description IN/OUT CATH URINE  Final   Special Requests   Final    NONE Performed at [facility], [place] [address] [place], [place] [address]    Culture 50,000 COLONIES/mL KLEBSIELLA PNEUMONIAE (A)  Final   Report Status 10/31/2018 FINAL  Final   Organism ID, Bacteria KLEBSIELLA PNEUMONIAE (A)  Final      Susceptibility   Klebsiella pneumoniae - MIC*    AMPICILLIN >=32 RESISTANT Resistant     CEFAZOLIN <=4 SENSITIVE Sensitive     CEFTRIAXONE <=1 SENSITIVE Sensitive     CIPROFLOXACIN <=0.25 SENSITIVE Sensitive     GENTAMICIN <=1 SENSITIVE Sensitive     IMIPENEM 1 SENSITIVE Sensitive     NITROFURANTOIN 128 RESISTANT Resistant     TRIMETH/SULFA <=20 SENSITIVE Sensitive     AMPICILLIN/SULBACTAM 4 SENSITIVE Sensitive     PIP/TAZO <=4 SENSITIVE Sensitive     Extended ESBL NEGATIVE Sensitive     * 50,000 COLONIES/mL KLEBSIELLA PNEUMONIAE  Blood Culture (routine x 2)     Status: Abnormal   Collection Time: 10/29/18  2:26 PM   Specimen: BLOOD  Result Value Ref Range Status   Specimen Description BLOOD RIGHT ANTECUBITAL  Final   Special Requests   Final    BOTTLES DRAWN  AEROBIC AND ANAEROBIC Blood Culture adequate volume   Culture  Setup Time   Final    GRAM NEGATIVE RODS IN BOTH AEROBIC AND ANAEROBIC BOTTLES CRITICAL RESULT CALLED TO, READ BACK BY AND VERIFIED WITH: T. EGAN,PHARMD 0423 10/30/2018 T. TYSOR    Culture (A)  Final  KLEBSIELLA PNEUMONIAE SUSCEPTIBILITIES PERFORMED ON PREVIOUS CULTURE WITHIN THE LAST 5 DAYS. Performed at [facility], [place] [address] [place], [place] [address]    Report Status 11/01/2018 FINAL  Final  Blood Culture (routine x 2)     Status: Abnormal   Collection Time: 10/29/18  3:23 PM   Specimen: BLOOD  Result Value Ref Range Status   Specimen Description BLOOD LEFT ANTECUBITAL  Final   Special Requests   Final    BOTTLES DRAWN AEROBIC AND ANAEROBIC Blood Culture results may not be optimal due to an inadequate volume of blood received in culture bottles   Culture  Setup Time   Final    GRAM NEGATIVE RODS IN BOTH AEROBIC AND ANAEROBIC BOTTLES CRITICAL RESULT CALLED TO, READ BACK BY AND VERIFIED WITH: TDonzetta Kohut [id number] 10/30/2018 Mena Goes Performed at [facility], [place] [address] [place], [place] [address]    Culture KLEBSIELLA PNEUMONIAE (A)  Final   Report Status 11/01/2018 FINAL  Final   Organism ID, Bacteria KLEBSIELLA PNEUMONIAE  Final      Susceptibility   Klebsiella pneumoniae - MIC*    AMPICILLIN >=32 RESISTANT Resistant     CEFAZOLIN <=4 SENSITIVE Sensitive     CEFEPIME <=1 SENSITIVE Sensitive     CEFTAZIDIME <=1 SENSITIVE Sensitive     CEFTRIAXONE <=1 SENSITIVE Sensitive     CIPROFLOXACIN <=0.25 SENSITIVE Sensitive     GENTAMICIN <=1 SENSITIVE Sensitive     IMIPENEM 0.5 SENSITIVE Sensitive     TRIMETH/SULFA <=20 SENSITIVE Sensitive     AMPICILLIN/SULBACTAM 4 SENSITIVE Sensitive     PIP/TAZO <=4 SENSITIVE Sensitive     Extended ESBL NEGATIVE Sensitive     * KLEBSIELLA PNEUMONIAE  Blood Culture ID Panel (Reflexed)     Status: Abnormal   Collection Time: 10/29/18  3:23 PM  Result Value  Ref Range Status   Enterococcus species NOT DETECTED NOT DETECTED Final   Listeria monocytogenes NOT DETECTED NOT DETECTED Final   Staphylococcus species NOT DETECTED NOT DETECTED Final   Staphylococcus aureus (BCID) NOT DETECTED NOT DETECTED Final   Streptococcus species NOT DETECTED NOT DETECTED Final   Streptococcus agalactiae NOT DETECTED NOT DETECTED Final   Streptococcus pneumoniae NOT DETECTED NOT DETECTED Final   Streptococcus pyogenes NOT DETECTED NOT DETECTED Final   Acinetobacter baumannii NOT DETECTED NOT DETECTED Final   Enterobacteriaceae species DETECTED (A) NOT DETECTED Final    Comment: Enterobacteriaceae represent a large family of gram-negative bacteria, not a single organism. CRITICAL RESULT CALLED TO, READ BACK BY AND VERIFIED WITH: T. EGAN,PHARMD 0423 10/30/2018 T. TYSOR    Enterobacter cloacae complex NOT DETECTED NOT DETECTED Final   Escherichia coli NOT DETECTED NOT DETECTED Final   Klebsiella oxytoca NOT DETECTED NOT DETECTED Final   Klebsiella pneumoniae DETECTED (A) NOT DETECTED Final    Comment: CRITICAL RESULT CALLED TO, READ BACK BY AND VERIFIED WITH: T. EGAN,PHARMD 0423 10/30/2018 T. TYSOR    Proteus species NOT DETECTED NOT DETECTED Final   Serratia marcescens NOT DETECTED NOT DETECTED Final   Carbapenem resistance NOT DETECTED NOT DETECTED Final   Haemophilus influenzae NOT DETECTED NOT DETECTED Final   Neisseria meningitidis NOT DETECTED NOT DETECTED Final   Pseudomonas aeruginosa NOT DETECTED NOT DETECTED Final   Candida albicans NOT DETECTED NOT DETECTED Final   Candida glabrata NOT DETECTED NOT DETECTED Final   Candida krusei NOT DETECTED NOT DETECTED Final   Candida parapsilosis NOT DETECTED NOT DETECTED Final   Candida tropicalis NOT  DETECTED NOT DETECTED Final    Comment: Performed at [facility], [place] [address], [place], [place] [address]  Urine Culture     Status: Abnormal   Collection Time: 10/29/18  7:09 PM   Specimen: Kidney,  Left; Urine  Result Value Ref Range Status   Specimen Description KIDNEY LEFT URINE  Final   Special Requests   Final    A Performed at [facility], [address] [place], [place] [address]    Culture >=100,000 COLONIES/mL KLEBSIELLA PNEUMONIAE (A)  Final   Report Status 10/31/2018 FINAL  Final   Organism ID, Bacteria KLEBSIELLA PNEUMONIAE (A)  Final      Susceptibility   Klebsiella pneumoniae - MIC*    AMPICILLIN >=32 RESISTANT Resistant     CEFAZOLIN <=4 SENSITIVE Sensitive     CEFEPIME <=1 SENSITIVE Sensitive     CEFTAZIDIME <=1 SENSITIVE Sensitive     CEFTRIAXONE <=1 SENSITIVE Sensitive     CIPROFLOXACIN <=0.25 SENSITIVE Sensitive     GENTAMICIN <=1 SENSITIVE Sensitive     IMIPENEM 0.5 SENSITIVE Sensitive     TRIMETH/SULFA <=20 SENSITIVE Sensitive     AMPICILLIN/SULBACTAM 4 SENSITIVE Sensitive     PIP/TAZO <=4 SENSITIVE Sensitive     Extended ESBL NEGATIVE Sensitive     * >=100,000 COLONIES/mL KLEBSIELLA PNEUMONIAE  MRSA PCR Screening     Status: None   Collection Time: 10/30/18  8:30 AM   Specimen: Nasopharyngeal  Result Value Ref Range Status   MRSA by PCR NEGATIVE NEGATIVE Final    Comment:        The GeneXpert MRSA Assay (FDA approved for NASAL specimens only), is one component of a comprehensive MRSA colonization surveillance program. It is not intended to diagnose MRSA infection nor to guide or monitor treatment for MRSA infections. Performed at [facility], [place] [address] [place], [place] [address]   Culture, blood (routine x 2)     Status: None (Preliminary result)   Collection Time: 11/02/18 12:41 PM   Specimen: BLOOD  Result Value Ref Range Status   Specimen Description BLOOD RIGHT ANTECUBITAL  Final   Special Requests   Final    BOTTLES DRAWN AEROBIC AND ANAEROBIC Blood Culture adequate volume   Culture   Final    NO GROWTH 3 DAYS Performed at [facility], [place] [address], [place], [place] [address]    Report Status PENDING   Incomplete  Culture, blood (routine x 2)     Status: None (Preliminary result)   Collection Time: 11/02/18 12:41 PM   Specimen: BLOOD  Result Value Ref Range Status   Specimen Description BLOOD LEFT ANTECUBITAL  Final   Special Requests   Final    BOTTLES DRAWN AEROBIC AND ANAEROBIC Blood Culture adequate volume   Culture   Final    NO GROWTH 3 DAYS Performed at [facility], [place] [address], [place], [place] [address]    Report Status PENDING  Incomplete  Culture, Urine     Status: None   Collection Time: 11/03/18 12:10 AM   Specimen: Urine, Random  Result Value Ref Range Status   Specimen Description URINE, RANDOM  Final   Special Requests NONE  Final   Culture   Final    NO GROWTH Performed at [facility], [place] [address], [place], [place] [address]    Report Status 11/03/2018 FINAL  Final         Radiology Studies: Ir  Nephrostomy Exchange Left  Result Date: 11/04/2018 INDICATION: 48 year old female with a history of prior left-sided percutaneous nephrostomy EXAM: IR EXCHANGE NEPHROSTOMY LEFT COMPARISON:  None. MEDICATIONS: None ANESTHESIA/SEDATION: Fentanyl 75 mcg IV; Versed 1.5 mg IV Moderate Sedation Time:  12 minutes The patient was continuously monitored during the procedure by the interventional radiology nurse under my direct supervision. CONTRAST:  24mL OMNIPAQUE IOHEXOL 300 MG/ML SOLN - administered into the collecting system(s) FLUOROSCOPY TIME:  Fluoroscopy Time: 1 minutes 18 seconds (13 mGy). COMPLICATIONS: None PROCEDURE: Informed written consent was obtained from the patient after a thorough discussion of the procedural risks, benefits and alternatives. All questions were addressed. Maximal Sterile Barrier Technique was utilized including caps, mask, sterile gowns, sterile gloves, sterile drape, hand hygiene and skin antiseptic. A timeout was performed prior to the initiation of the procedure. Patient position prone position on the fluoroscopy table the  left flank was prepped and draped in the usual sterile fashion. 1% lidocaine was used for local anesthesia. 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 Small amount of contrast confirmed location. Catheter was sutured in position and attached to gravity drainage. Patient tolerated the procedure well and remained hemodynamically stable throughout. No complications were encountered and no significant blood loss. IMPRESSION: Status post routine exchange of left-sided percutaneous nephrostomy. Signed, Dulcy Fanny. Dellia Nims, RPVI [facility] Electronically Signed   By: Corrie Mckusick D.O.   On: 11/04/2018 08:14        Scheduled Meds:  acetaminophen  1,000 mg Oral Q6H   Chlorhexidine Gluconate Cloth  6 each Topical Daily   mouth rinse  15 mL Mouth Rinse BID   nystatin  5 mL Mouth/Throat QID   pantoprazole  40 mg Oral Daily   polyethylene glycol  17 g Oral Daily   senna-docusate  2 tablet Oral QHS   sodium chloride flush  3 mL Intravenous Q12H   sodium chloride flush  5 mL Intracatheter Q8H   tamsulosin  0.4 mg Oral Daily   Continuous Infusions:  sodium chloride     sodium chloride 125 mL/hr at 11/05/18 0939   cefTRIAXone (ROCEPHIN)  IV 2 g (11/04/18 2138)   ondansetron (ZOFRAN) IV 8 mg (11/04/18 2233)     LOS: 7 days     Cordelia Poche, MD [facility] 11/05/2018, 11:08 AM  If 7PM-7AM, please contact night-coverage [email or web address]

## 2018-11-06 ENCOUNTER — Inpatient Hospital Stay (HOSPITAL_COMMUNITY): Payer: Managed Care, Other (non HMO)

## 2018-11-06 LAB — RENAL FUNCTION PANEL
Albumin: 2.1 g/dL — ABNORMAL LOW (ref 3.5–5.0)
Anion gap: 9 (ref 5–15)
BUN: <5 mg/dL — ABNORMAL LOW (ref 6–20)
CO2: 23 mmol/L (ref 22–32)
Calcium: 7.9 mg/dL — ABNORMAL LOW (ref 8.9–10.3)
Chloride: 109 mmol/L (ref 98–111)
Creatinine, Ser: 0.85 mg/dL (ref 0.44–1.00)
GFR calc Af Amer: >60 mL/min (ref >60)
GFR calc non Af Amer: >60 mL/min (ref >60)
Glucose, Bld: 111 mg/dL — ABNORMAL HIGH (ref 70–99)
Phosphorus: 2.6 mg/dL (ref 2.5–4.6)
Potassium: 3.2 mmol/L — ABNORMAL LOW (ref 3.5–5.1)
Sodium: 141 mmol/L (ref 135–145)

## 2018-11-06 LAB — CBC
HCT: 29.8 % — ABNORMAL LOW (ref 36.0–46.0)
Hemoglobin: 9.7 g/dL — ABNORMAL LOW (ref 12.0–15.0)
MCH: 29 pg (ref 26.0–34.0)
MCHC: 32.6 g/dL (ref 30.0–36.0)
MCV: 89.2 fL (ref 80.0–100.0)
Platelets: 553 10*3/uL — ABNORMAL HIGH (ref 150–400)
RBC: 3.34 MIL/uL — ABNORMAL LOW (ref 3.87–5.11)
RDW: 14.2 % (ref 11.5–15.5)
WBC: 10.1 10*3/uL (ref 4.0–10.5)
nRBC: 0.2 % (ref 0.0–0.2)

## 2018-11-06 LAB — MAGNESIUM: Magnesium: 1.9 mg/dL (ref 1.7–2.4)

## 2018-11-06 MED ORDER — IOHEXOL 300 MG/ML  SOLN
100.0000 mL | Freq: Once | INTRAMUSCULAR | Status: AC | PRN
  Administered 2018-11-06: 100 mL via INTRAVENOUS

## 2018-11-06 MED ORDER — POTASSIUM CHLORIDE CRYS ER 20 MEQ PO TBCR
40.0000 meq | EXTENDED_RELEASE_TABLET | Freq: Once | ORAL | Status: AC
  Administered 2018-11-06: 40 meq via ORAL
  Filled 2018-11-06: qty 2

## 2018-11-06 NOTE — Progress Notes (Signed)
Physical Therapy Treatment Patient Details Name: Tamara Watson MRN: [id number] DOB: [birth year] Today's Date: 11/06/2018    History of Present Illness Tamara Watson is a 48 y.o. female presenting with presumed sepsis with lactic acidosis most likely due to ureteral stone . PMH is significant for history of anemia due to menorrhagia and fibroids (s/p partial hysterectomy 06/2018), anxiety, constipation, GERD, and seasonal allergies. pt s/p nephrostomy tube placement by IR on 9/27.    PT Comments    Patient progressing and able to negotiate steps this session.  Walked earlier on unit with OT.  Currently close S to minguard for ambulation.  Feel she will continue to progress and be able to go home with family support.  PT to follow acutely.    Follow Up Recommendations  No PT follow up;Supervision/Assistance - 24 hour     Equipment Recommendations  None recommended by PT    Recommendations for Other Services       Precautions / Restrictions Precautions Precautions: None Precaution Comments: Has drain    Mobility  Bed Mobility Overal bed mobility: Independent       Supine to sit: Independent Sit to supine: Independent      Transfers Overall transfer level: Modified independent Equipment used: None   Sit to Stand: Modified independent (Device/Increase time)            Ambulation/Gait Ambulation/Gait assistance: Min guard;Supervision Gait Distance (Feet): 350 Feet Assistive device: None Gait Pattern/deviations: Decreased stride length     General Gait Details: slow and stopping occasionally due to fatigue   Stairs Stairs: Yes Stairs assistance: Min guard;Supervision Stair Management: One rail Left;Step to pattern;Alternating pattern;Forwards Number of Stairs: 10 General stair comments: step through to ascend, step to partly for descending   Wheelchair Mobility    Modified Rankin (Stroke Patients Only)       Balance Overall balance  assessment: No apparent balance deficits (not formally assessed)         Standing balance support: During functional activity Standing balance-Leahy Scale: Good                              Cognition Arousal/Alertness: Awake/alert Behavior During Therapy: WFL for tasks assessed/performed Overall Cognitive Status: Within Functional Limits for tasks assessed                                        Exercises      General Comments General comments (skin integrity, edema, etc.): spouse in the room, pt reports she will have several family members assisting at d/c      Pertinent Vitals/Pain Pain Assessment: No/denies pain    Home Living                      Prior Function            PT Goals (current goals can now be found in the care plan section) Progress towards PT goals: Progressing toward goals    Frequency    Min 3X/week      PT Plan Current plan remains appropriate    Co-evaluation              AM-PAC PT "6 Clicks" Mobility   Outcome Measure  Help needed turning from your back to your side while in a flat bed without using bedrails?: None  Help needed moving from lying on your back to sitting on the side of a flat bed without using bedrails?: None Help needed moving to and from a bed to a chair (including a wheelchair)?: None Help needed standing up from a chair using your arms (e.g., wheelchair or bedside chair)?: None Help needed to walk in hospital room?: A Little Help needed climbing 3-5 steps with a railing? : A Little 6 Click Score: 22    End of Session   Activity Tolerance: Patient tolerated treatment well Patient left: in bed;with call bell/phone within reach;with family/visitor present   PT Visit Diagnosis: Difficulty in walking, not elsewhere classified (R26.2);Muscle weakness (generalized) (M62.81)     Time: [id number] PT Time Calculation (min) (ACUTE ONLY): 16 min  Charges:  $Gait Training: 8-22  mins                     Magda Kiel, [place] [phone] 11/06/2018    Reginia Naas 11/06/2018, 5:16 PM

## 2018-11-06 NOTE — Progress Notes (Signed)
PROGRESS NOTE    Tamara Watson  [id number] DOB: [birth year] DOA: 10/29/2018 PCP: Haywood Pao, MD   Brief Narrative: Tamara Watson is a 48 y.o. female that presented secondary to sepsis in setting of pylonephritis and bacteremia from klebsiella pneumoniae infection. On antibiotics. S/p perc drain.   Assessment & Plan:   Active Problems:   Sepsis ([place])   Pyelonephritis   Left ureteral stone   Tachycardia   Fever   Renal abscess, left   Nausea   Sepsis Secondary to Klebsiella bacteremia and pyelonephritis. Present on admission.  Bacteremia Klebsiella pneumoniae on 9/27 Bcx. Repeat blood cultures on 10/1 no growth to date. Initially treated with Vancomycin/cefepime, transitioned to cefazolin and now changed to Ceftriaxone. -Continue ceftriaxone  Pyelonephritis Renal abscess In setting of nephrolithiasis. Patient underwent left percutaneous nephrostomy tube insertion on 9/27 by IR. Tube exchanged on 10/2 by IR. Urology on board. Per patient, fever up to 102 degrees yesterday -Continue antibiotics as mentioned above -Repeat CT abdomen/pelvis -Urology recommendations  Nausea/vomiting Somewhat improved. -Continue phenergan prn -Continue Zofran and hydroxyzine prn  AKI Noted in chart. Does not meet criteria for AKI.   DVT prophylaxis: SCDs Code Status:   Code Status: Full Code Family Communication: None at bedside Disposition Plan: Discharge home likely in 2-3 days pending transition to oral antibiotics, improvement of oral intake, specialist recommendations   Consultants:   Interventional radiology  Urology  Procedures:   9/27: left percutaneous nephrostomy tube insertion  10/2: left percutaneous nephrostomy tube exchange  Antimicrobials:  Vancomycin  Cefepime  Ancef  Ceftriaxone    Subjective: Nausea improved slightly. Fever last night up to 102 degrees.  Objective: Vitals:   11/06/18 0436 11/06/18 0445 11/06/18  0819 11/06/18 0844  BP: (!) 143/67  (!) 162/73   Pulse: 75  78   Resp: 18  20   Temp: 98.6 F (37 C)  97.8 F (36.6 C) 98.7 F (37.1 C)  TempSrc: Oral  Oral Oral  SpO2: 99%  100%   Weight:  97.6 kg    Height:        Intake/Output Summary (Last 24 hours) at 11/06/2018 1212 Last data filed at 11/06/2018 1114 Gross per 24 hour  Intake 3153 ml  Output 5400 ml  Net -2247 ml   Filed Weights   11/04/18 0500 11/05/18 0500 11/06/18 0445  Weight: 101.2 kg 99.2 kg 97.6 kg    Examination:  General exam: Appears calm and comfortable Respiratory system: Clear to auscultation. Respiratory effort normal. Cardiovascular system: S1 & S2 heard, RRR. No murmurs, rubs, gallops or clicks. Gastrointestinal system: Abdomen is nondistended, soft and nontender. No organomegaly or masses felt. Normal bowel sounds heard. Central nervous system: Alert and oriented. No focal neurological deficits. Extremities: Trace LE edema. No calf tenderness Skin: No cyanosis. No rashes Psychiatry: Judgement and insight appear normal. Mood & affect appropriate.     Data Reviewed: I have personally reviewed following labs and imaging studies  CBC: Recent Labs  Lab 11/02/18 0412 11/03/18 0614 11/04/18 0745 11/05/18 0710 11/06/18 0957  WBC 10.8* 13.2* 13.0* 11.8* 10.1  NEUTROABS  --  10.0* 10.4* 9.3*  --   HGB 11.1* 10.1* 10.5* 9.7* 9.7*  HCT 33.1* 29.6* 32.0* 28.2* 29.8*  MCV 87.8 87.6 89.1 87.9 89.2  PLT 118* 193 266 361 [id number]*   Basic Metabolic Panel: Recent Labs  Lab 11/01/18 0111 11/02/18 0412 11/03/18 0614 11/04/18 0745 11/06/18 0957  NA 137 137 136 138 141  K 3.7 3.3* 3.3*  3.5 3.2*  CL 108 109 108 108 109  CO2 20* 21* 20* 21* 23  GLUCOSE 97 110* 94 106* 111*  BUN 12 9 5* 5* <5*  CREATININE 1.21* 1.22* 0.99 0.95 0.85  CALCIUM 8.0* 7.4* 7.7* 7.9* 7.9*  MG  --   --   --   --  1.9  PHOS 2.5 2.4* 2.8 3.0 2.6   GFR: Estimated Creatinine Clearance: 97.6 mL/min (by C-G formula based on SCr of  0.85 mg/dL). Liver Function Tests: Recent Labs  Lab 11/01/18 0111 11/02/18 0412 11/03/18 0614 11/04/18 0745 11/06/18 0957  ALBUMIN 1.9* 1.7* 1.7* 1.9* 2.1*   No results for input(s): LIPASE, AMYLASE in the last 168 hours. No results for input(s): AMMONIA in the last 168 hours. Coagulation Profile: No results for input(s): INR, PROTIME in the last 168 hours. Cardiac Enzymes: No results for input(s): CKTOTAL, CKMB, CKMBINDEX, TROPONINI in the last 168 hours. BNP (last 3 results) No results for input(s): PROBNP in the last 8760 hours. HbA1C: No results for input(s): HGBA1C in the last 72 hours. CBG: No results for input(s): GLUCAP in the last 168 hours. Lipid Profile: No results for input(s): CHOL, HDL, LDLCALC, TRIG, CHOLHDL, LDLDIRECT in the last 72 hours. Thyroid Function Tests: No results for input(s): TSH, T4TOTAL, FREET4, T3FREE, THYROIDAB in the last 72 hours. Anemia Panel: No results for input(s): VITAMINB12, FOLATE, FERRITIN, TIBC, IRON, RETICCTPCT in the last 72 hours. Sepsis Labs: No results for input(s): PROCALCITON, LATICACIDVEN in the last 168 hours.  Recent Results (from the past 240 hour(s))  SARS Coronavirus 2 [facility] order, Performed in [facility] hospital lab) Nasopharyngeal Nasopharyngeal Swab     Status: None   Collection Time: 10/29/18  2:09 PM   Specimen: Nasopharyngeal Swab  Result Value Ref Range Status   SARS Coronavirus 2 NEGATIVE NEGATIVE Final    Comment: (NOTE) If result is NEGATIVE SARS-CoV-2 target nucleic acids are NOT DETECTED. The SARS-CoV-2 RNA is generally detectable in upper and lower  respiratory specimens during the acute phase of infection. The lowest  concentration of SARS-CoV-2 viral copies this assay can detect is 250  copies / mL. A negative result does not preclude SARS-CoV-2 infection  and should not be used as the sole basis for treatment or other  patient management decisions.  A negative result may occur with  improper  specimen collection / handling, submission of specimen other  than nasopharyngeal swab, presence of viral mutation(s) within the  areas targeted by this assay, and inadequate number of viral copies  (<250 copies / mL). A negative result must be combined with clinical  observations, patient history, and epidemiological information. If result is POSITIVE SARS-CoV-2 target nucleic acids are DETECTED. The SARS-CoV-2 RNA is generally detectable in upper and lower  respiratory specimens dur ing the acute phase of infection.  Positive  results are indicative of active infection with SARS-CoV-2.  Clinical  correlation with patient history and other diagnostic information is  necessary to determine patient infection status.  Positive results do  not rule out bacterial infection or co-infection with other viruses. If result is PRESUMPTIVE POSTIVE SARS-CoV-2 nucleic acids MAY BE PRESENT.   A presumptive positive result was obtained on the submitted specimen  and confirmed on repeat testing.  While 2019 novel coronavirus  (SARS-CoV-2) nucleic acids may be present in the submitted sample  additional confirmatory testing may be necessary for epidemiological  and / or clinical management purposes  to differentiate between  SARS-CoV-2 and other Sarbecovirus currently  known to infect humans.  If clinically indicated additional testing with an alternate test  methodology [phone]) is advised. The SARS-CoV-2 RNA is generally  detectable in upper and lower respiratory sp ecimens during the acute  phase of infection. The expected result is Negative. Fact Sheet for Patients:  StrictlyIdeas.no Fact Sheet for Healthcare Providers: BankingDealers.co.za This test is not yet approved or cleared by the [place] FDA and has been authorized for detection and/or diagnosis of SARS-CoV-2 by FDA under an Emergency Use Authorization (EUA).  This EUA will remain in  effect (meaning this test can be used) for the duration of the COVID-19 declaration under Section 564(b)(1) of the Act, 21 U.S.C. section 360bbb-3(b)(1), unless the authorization is terminated or revoked sooner. Performed at [facility], [place] [address] [place], [place] [address]   Urine culture     Status: Abnormal   Collection Time: 10/29/18  2:21 PM   Specimen: In/Out Cath Urine  Result Value Ref Range Status   Specimen Description IN/OUT CATH URINE  Final   Special Requests   Final    NONE Performed at [facility], [place] [address] [place], [place] [address]    Culture 50,000 COLONIES/mL KLEBSIELLA PNEUMONIAE (A)  Final   Report Status 10/31/2018 FINAL  Final   Organism ID, Bacteria KLEBSIELLA PNEUMONIAE (A)  Final      Susceptibility   Klebsiella pneumoniae - MIC*    AMPICILLIN >=32 RESISTANT Resistant     CEFAZOLIN <=4 SENSITIVE Sensitive     CEFTRIAXONE <=1 SENSITIVE Sensitive     CIPROFLOXACIN <=0.25 SENSITIVE Sensitive     GENTAMICIN <=1 SENSITIVE Sensitive     IMIPENEM 1 SENSITIVE Sensitive     NITROFURANTOIN 128 RESISTANT Resistant     TRIMETH/SULFA <=20 SENSITIVE Sensitive     AMPICILLIN/SULBACTAM 4 SENSITIVE Sensitive     PIP/TAZO <=4 SENSITIVE Sensitive     Extended ESBL NEGATIVE Sensitive     * 50,000 COLONIES/mL KLEBSIELLA PNEUMONIAE  Blood Culture (routine x 2)     Status: Abnormal   Collection Time: 10/29/18  2:26 PM   Specimen: BLOOD  Result Value Ref Range Status   Specimen Description BLOOD RIGHT ANTECUBITAL  Final   Special Requests   Final    BOTTLES DRAWN AEROBIC AND ANAEROBIC Blood Culture adequate volume   Culture  Setup Time   Final    GRAM NEGATIVE RODS IN BOTH AEROBIC AND ANAEROBIC BOTTLES CRITICAL RESULT CALLED TO, READ BACK BY AND VERIFIED WITH: T. EGAN,PHARMD 0423 10/30/2018 T. TYSOR    Culture (A)  Final    KLEBSIELLA PNEUMONIAE SUSCEPTIBILITIES PERFORMED ON PREVIOUS CULTURE WITHIN THE LAST 5 DAYS. Performed at [facility], [place] [address] [place], [place] [address]    Report Status 11/01/2018 FINAL  Final  Blood Culture (routine x 2)     Status: Abnormal   Collection Time: 10/29/18  3:23 PM   Specimen: BLOOD  Result Value Ref Range Status   Specimen Description BLOOD LEFT ANTECUBITAL  Final   Special Requests   Final    BOTTLES DRAWN AEROBIC AND ANAEROBIC Blood Culture results may not be optimal due to an inadequate volume of blood received in culture bottles   Culture  Setup Time   Final    GRAM NEGATIVE RODS IN BOTH AEROBIC AND ANAEROBIC BOTTLES CRITICAL RESULT CALLED TO, READ BACK BY AND VERIFIED WITH: TDonzetta Kohut [id number] 10/30/2018 Mena Goes Performed at [facility], [place],  [place] [address]    Culture KLEBSIELLA PNEUMONIAE (A)  Final   Report Status 11/01/2018 FINAL  Final   Organism ID, Bacteria KLEBSIELLA PNEUMONIAE  Final      Susceptibility   Klebsiella pneumoniae - MIC*    AMPICILLIN >=32 RESISTANT Resistant     CEFAZOLIN <=4 SENSITIVE Sensitive     CEFEPIME <=1 SENSITIVE Sensitive     CEFTAZIDIME <=1 SENSITIVE Sensitive     CEFTRIAXONE <=1 SENSITIVE Sensitive     CIPROFLOXACIN <=0.25 SENSITIVE Sensitive     GENTAMICIN <=1 SENSITIVE Sensitive     IMIPENEM 0.5 SENSITIVE Sensitive     TRIMETH/SULFA <=20 SENSITIVE Sensitive     AMPICILLIN/SULBACTAM 4 SENSITIVE Sensitive     PIP/TAZO <=4 SENSITIVE Sensitive     Extended ESBL NEGATIVE Sensitive     * KLEBSIELLA PNEUMONIAE  Blood Culture ID Panel (Reflexed)     Status: Abnormal   Collection Time: 10/29/18  3:23 PM  Result Value Ref Range Status   Enterococcus species NOT DETECTED NOT DETECTED Final   Listeria monocytogenes NOT DETECTED NOT DETECTED Final   Staphylococcus species NOT DETECTED NOT DETECTED Final   Staphylococcus aureus (BCID) NOT DETECTED NOT DETECTED Final   Streptococcus species NOT DETECTED NOT DETECTED Final   Streptococcus agalactiae NOT DETECTED NOT DETECTED Final   Streptococcus  pneumoniae NOT DETECTED NOT DETECTED Final   Streptococcus pyogenes NOT DETECTED NOT DETECTED Final   Acinetobacter baumannii NOT DETECTED NOT DETECTED Final   Enterobacteriaceae species DETECTED (A) NOT DETECTED Final    Comment: Enterobacteriaceae represent a large family of gram-negative bacteria, not a single organism. CRITICAL RESULT CALLED TO, READ BACK BY AND VERIFIED WITH: T. EGAN,PHARMD 0423 10/30/2018 T. TYSOR    Enterobacter cloacae complex NOT DETECTED NOT DETECTED Final   Escherichia coli NOT DETECTED NOT DETECTED Final   Klebsiella oxytoca NOT DETECTED NOT DETECTED Final   Klebsiella pneumoniae DETECTED (A) NOT DETECTED Final    Comment: CRITICAL RESULT CALLED TO, READ BACK BY AND VERIFIED WITH: T. EGAN,PHARMD 0423 10/30/2018 T. TYSOR    Proteus species NOT DETECTED NOT DETECTED Final   Serratia marcescens NOT DETECTED NOT DETECTED Final   Carbapenem resistance NOT DETECTED NOT DETECTED Final   Haemophilus influenzae NOT DETECTED NOT DETECTED Final   Neisseria meningitidis NOT DETECTED NOT DETECTED Final   Pseudomonas aeruginosa NOT DETECTED NOT DETECTED Final   Candida albicans NOT DETECTED NOT DETECTED Final   Candida glabrata NOT DETECTED NOT DETECTED Final   Candida krusei NOT DETECTED NOT DETECTED Final   Candida parapsilosis NOT DETECTED NOT DETECTED Final   Candida tropicalis NOT DETECTED NOT DETECTED Final    Comment: Performed at [facility], [place]. [address], [place], [place] [address]  Urine Culture     Status: Abnormal   Collection Time: 10/29/18  7:09 PM   Specimen: Kidney, Left; Urine  Result Value Ref Range Status   Specimen Description KIDNEY LEFT URINE  Final   Special Requests   Final    A Performed at [facility], [address] [place], [place] [address]    Culture >=100,000 COLONIES/mL KLEBSIELLA PNEUMONIAE (A)  Final   Report Status 10/31/2018 FINAL  Final   Organism ID, Bacteria KLEBSIELLA PNEUMONIAE (A)  Final       Susceptibility   Klebsiella pneumoniae - MIC*    AMPICILLIN >=32 RESISTANT Resistant     CEFAZOLIN <=4 SENSITIVE Sensitive     CEFEPIME <=1 SENSITIVE Sensitive     CEFTAZIDIME <=1  SENSITIVE Sensitive     CEFTRIAXONE <=1 SENSITIVE Sensitive     CIPROFLOXACIN <=0.25 SENSITIVE Sensitive     GENTAMICIN <=1 SENSITIVE Sensitive     IMIPENEM 0.5 SENSITIVE Sensitive     TRIMETH/SULFA <=20 SENSITIVE Sensitive     AMPICILLIN/SULBACTAM 4 SENSITIVE Sensitive     PIP/TAZO <=4 SENSITIVE Sensitive     Extended ESBL NEGATIVE Sensitive     * >=100,000 COLONIES/mL KLEBSIELLA PNEUMONIAE  MRSA PCR Screening     Status: None   Collection Time: 10/30/18  8:30 AM   Specimen: Nasopharyngeal  Result Value Ref Range Status   MRSA by PCR NEGATIVE NEGATIVE Final    Comment:        The GeneXpert MRSA Assay (FDA approved for NASAL specimens only), is one component of a comprehensive MRSA colonization surveillance program. It is not intended to diagnose MRSA infection nor to guide or monitor treatment for MRSA infections. Performed at [facility], [place] [address], [place], [place] [address]   Culture, blood (routine x 2)     Status: None (Preliminary result)   Collection Time: 11/02/18 12:41 PM   Specimen: BLOOD  Result Value Ref Range Status   Specimen Description BLOOD RIGHT ANTECUBITAL  Final   Special Requests   Final    BOTTLES DRAWN AEROBIC AND ANAEROBIC Blood Culture adequate volume   Culture   Final    NO GROWTH 4 DAYS Performed at [facility], [place] [address] [place], [place] [address]    Report Status PENDING  Incomplete  Culture, blood (routine x 2)     Status: None (Preliminary result)   Collection Time: 11/02/18 12:41 PM   Specimen: BLOOD  Result Value Ref Range Status   Specimen Description BLOOD LEFT ANTECUBITAL  Final   Special Requests   Final    BOTTLES DRAWN AEROBIC AND ANAEROBIC Blood Culture adequate volume   Culture   Final    NO GROWTH 4 DAYS Performed at  [facility], [place] [address], [place], [place] [address]    Report Status PENDING  Incomplete  Culture, Urine     Status: None   Collection Time: 11/03/18 12:10 AM   Specimen: Urine, Random  Result Value Ref Range Status   Specimen Description URINE, RANDOM  Final   Special Requests NONE  Final   Culture   Final    NO GROWTH Performed at [facility], [place] [address] [place], [place] [address]    Report Status 11/03/2018 FINAL  Final         Radiology Studies: No results found.      Scheduled Meds: . acetaminophen  1,000 mg Oral Q6H  . Chlorhexidine Gluconate Cloth  6 each Topical Daily  . mouth rinse  15 mL Mouth Rinse BID  . nystatin  5 mL Mouth/Throat QID  . pantoprazole  40 mg Oral Daily  . polyethylene glycol  17 g Oral Daily  . senna-docusate  2 tablet Oral QHS  . sodium chloride flush  3 mL Intravenous Q12H  . sodium chloride flush  5 mL Intracatheter Q8H  . tamsulosin  0.4 mg Oral Daily   Continuous Infusions: . sodium chloride    . cefTRIAXone (ROCEPHIN)  IV 2 g (11/05/18 2250)  . ondansetron [facility]) IV 8 mg (11/04/18 2233)     LOS: 8 days     Cordelia Poche, MD [facility] 11/06/2018, 12:12 PM  If 7PM-7AM, please contact night-coverage [email or web address]

## 2018-11-06 NOTE — Evaluation (Signed)
Occupational Therapy Evaluation and Discharge Patient Details Name: Tamara Watson MRN: [id number] DOB: 11-09-1970 Today's Date: 11/06/2018    History of Present Illness Tamara Watson is a 48 y.o. female presenting with presumed sepsis with lactic acidosis most likely due to ureteral stone . PMH is significant for history of anemia due to menorrhagia and fibroids (s/p partial hysterectomy 06/2018), anxiety, constipation, GERD, and seasonal allergies. pt s/p nephrostomy tube placement by IR on 9/27.   Clinical Impression   This 48 yo female admitted and underwent above presents to acute OT at a Independent to Mod I level (increased time) for all basic ADLs and associated mobility, we will D/C from acute OT.    Follow Up Recommendations  No OT follow up;Supervision - Intermittent    Equipment Recommendations  None recommended by OT       Precautions / Restrictions Precautions Precautions: None Precaution Comments: Has drain Restrictions Weight Bearing Restrictions: No      Mobility Bed Mobility Overal bed mobility: Independent                Transfers Overall transfer level: Independent               General transfer comment: Pt ambulated around the whole unit without AD with decreased speed and no A    Balance Overall balance assessment: No apparent balance deficits (not formally assessed)                                         ADL either performed or assessed with clinical judgement   ADL Overall ADL's : Independent/Mod I                                             Vision Patient Visual Report: No change from baseline              Pertinent Vitals/Pain Pain Assessment: Faces Faces Pain Scale: Hurts little more Pain Location: Lft flank Pain Descriptors / Indicators: Constant Pain Intervention(s): Monitored during session;Repositioned     Hand Dominance Right   Extremity/Trunk Assessment  Upper Extremity Assessment Upper Extremity Assessment: Overall WFL for tasks assessed           Communication Communication Communication: No difficulties   Cognition Arousal/Alertness: Awake/alert Behavior During Therapy: Flat affect Overall Cognitive Status: Within Functional Limits for tasks assessed                                 General Comments: Tired of this wanting to get better and go home              Home Living Family/patient expects to be discharged to:: Private residence Living Arrangements: Spouse/significant other Available Help at Discharge: Family;Available 24 hours/day(her mother will be with her) Type of Home: House Home Access: Stairs to enter [employer] of Steps: 5 Entrance Stairs-Rails: Can reach both Home Layout: Two level Alternate Level Stairs-Number of Steps: 6 Alternate Level Stairs-Rails: Right Bathroom Shower/Tub: Tub/shower unit;Curtain   Bathroom Toilet: Standard     Home Equipment: None   Additional Comments: stool she can put in shower      Prior Functioning/Environment Level of Independence: Independent  Comments: works as a Therapist, 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        OT Problem List: Pain         OT Goals(Current goals can be found in the care plan section) Acute Rehab OT Goals Patient Stated Goal: to go home  OT Frequency:                AM-PAC OT "6 Clicks" Daily Activity     Outcome Measure Help from another person eating meals?: None Help from another person taking care of personal grooming?: None Help from another person toileting, which includes using toliet, bedpan, or urinal?: None Help from another person bathing (including washing, rinsing, drying)?: None Help from another person to put on and taking off regular upper body clothing?: None Help from another person to put on and taking off regular lower body clothing?: None 6 Click Score: 24   End of Session Nurse Communication:  (NT: pt emptied 300 ccs of urine)  Activity Tolerance: Patient tolerated treatment well Patient left: in bed;with call bell/phone within reach  OT Visit Diagnosis: Unsteadiness on feet (R26.81);Pain Pain - part of body: (left flank)                Time: [id number] OT Time Calculation (min): 27 min Charges:  OT General Charges $OT Visit: 1 Visit OT Evaluation $OT Eval Moderate Complexity: 1 Mod OT Treatments $Self Care/Home Management : 8-22 mins  [place] Circle, OTR/L Acute [employer] Pager [phone] Office [phone]    Almon Register 11/06/2018, 11:40 AM

## 2018-11-06 NOTE — Progress Notes (Signed)
Referring Physician(s): Dr Phillis Knack  Supervising Physician: Jacqulynn Cadet  Patient Status:  [facility] - In-pt  Chief Complaint:  Left PCN placed 9/27 Exchanged 10/2 in IR  Subjective:  OP is great Much less pain Up in bed Eating Reg diet  Allergies: Patient has no known allergies.  Medications: Prior to Admission medications   Medication Sig Start Date End Date Taking? Authorizing Provider  acetaminophen (TYLENOL) 500 MG tablet Take 1,000 mg by mouth every 6 (six) hours as needed for headache.   Yes [provider]  dexlansoprazole (DEXILANT) 60 MG capsule TAKE 1 CAPSULE BY MOUTH DAILY BEFORE BREAKFAST. Patient taking differently: Take 60 mg by mouth daily before breakfast.  08/01/18  Yes Nandigam, Venia Minks, MD  fluticasone (FLONASE) 50 MCG/ACT nasal spray Place 2 sprays into both nostrils daily. Patient taking differently: Place 2 sprays into both nostrils daily as needed for allergies.  11/08/16  Yes Rogue Bussing, MD  ibuprofen (ADVIL) 200 MG tablet Take 600 mg by mouth every 6 (six) hours as needed for headache (pain).   Yes [provider]  ondansetron (ZOFRAN) 4 MG tablet Take 1 tablet (4 mg total) by mouth every 6 (six) hours. 10/27/18  Yes Alveria Apley, PA-C  oxyCODONE-acetaminophen (PERCOCET/ROXICET) 5-325 MG tablet Take 1-2 tablets by mouth every 6 (six) hours as needed for moderate pain (once the patient tolerates po and PCA has been discontinued). Patient taking differently: Take 1-2 tablets by mouth every 6 (six) hours as needed for moderate pain.  06/16/18  Yes Waymon Amato, MD  senna-docusate (SENOKOT-S) 8.6-50 MG tablet Take 2 tablets by mouth at bedtime as needed for mild constipation or moderate constipation. 06/16/18  Yes Waymon Amato, MD  tamsulosin (FLOMAX) 0.4 MG CAPS capsule Take 1 capsule (0.4 mg total) by mouth daily. 10/27/18 11/26/18 Yes Madilyn Hook A, PA-C  vitamin C (ASCORBIC ACID) 500 MG tablet Take 500 mg by mouth daily as  needed (immune system boost/ cold symptoms).    Yes [provider]  benzonatate (TESSALON) 100 MG capsule Take 1 capsule (100 mg total) by mouth 3 (three) times daily as needed for cough. Patient not taking: Reported on 10/29/2018 01/09/18   [place] Bing, DO  docusate sodium (COLACE) 100 MG capsule Take 1 capsule (100 mg total) by mouth 2 (two) times daily. Patient not taking: Reported on 10/29/2018 06/16/18   Waymon Amato, MD  ibuprofen (ADVIL) 600 MG tablet Take 1 tablet (600 mg total) by mouth every 6 (six) hours. Patient not taking: Reported on 10/29/2018 06/19/18   Waymon Amato, MD  lidocaine (LIDODERM) 5 % Place 1 patch onto the skin daily. Remove & Discard patch within 12 hours or as directed by MD Patient not taking: Reported on 10/29/2018 06/16/18   Waymon Amato, MD  LINZESS 72 MCG capsule TAKE 1 CAPSULE (72 MCG TOTAL) BY MOUTH DAILY BEFORE BREAKFAST. Patient not taking: No sig reported 11/03/17   Mauri Pole, MD  pantoprazole (PROTONIX) 40 MG tablet Take 1 tablet (40 mg total) by mouth daily. Patient not taking: Reported on 10/29/2018 06/17/18   Waymon Amato, MD     Vital Signs: BP (!) 162/73 (BP Location: Left Arm)   Pulse 78   Temp 98.7 F (37.1 C) (Oral)   Resp 20   Ht 5' 5.5" (1.664 m)   Wt 215 lb 2.7 oz (97.6 kg)   LMP 06/05/2018   SpO2 100%   BMI 35.26 kg/m   Physical Exam Vitals signs  reviewed.  Skin:    General: Skin is warm and dry.     Comments: Site of PCN is clean and dry' NT no bleeding OP is great Straw color OP   Neurological:     Mental Status: She is alert.     Imaging: Ct Abdomen Pelvis Wo Contrast  Result Date: 11/03/2018 CLINICAL DATA:  Complicated pyelonephritis. Left nephrostomy tube in place draining blood tinged fluid. EXAM: CT ABDOMEN AND PELVIS WITHOUT CONTRAST TECHNIQUE: Multidetector CT imaging of the abdomen and pelvis was performed following the standard protocol without IV contrast. COMPARISON:  CT 10/30/2018, 10/29/2018,  additional priors. FINDINGS: Lower chest: Small bilateral pleural effusions, left greater than right, slight increase from prior exam. Associated atelectasis. Hepatobiliary: Hepatic steatosis. Gallbladder physiologically distended, no calcified stone. No biliary dilatation. Pancreas: No ductal dilatation or inflammation. Spleen: Normal in size without focal abnormality. Adrenals/Urinary Tract: Normal adrenal glands. Left nephrostomy tube unchanged position with tip in the renal pelvis. No abnormality along the course of the nephrostomy tubing. Left hydronephrosis has near completely resolved. Persistent and slight worsening left perinephric edema. Questionable developing low-density region in the inferior left kidney, series 3, image 37. There is persistent left periureteric edema with 5 mm stone at the left ureterovesicular junction. Urinary bladder physiologically distended. No bladder stone. No right hydronephrosis or urolithiasis. Stomach/Bowel: Stomach is within normal limits. Appendix appears normal. No evidence of bowel wall thickening, distention, or inflammatory changes. Administered enteric contrast reaches the distal colon. Vascular/Lymphatic: Abdominal aorta is normal in caliber. Probable small left retroperitoneal nodes not well assessed. No bulky abdominopelvic adenopathy. Reproductive: 2.8 cm cyst in the right ovary is unchanged in size, probable fluid level suggesting hemorrhagic cyst. Left ovary is unremarkable. Post hysterectomy. Other: Mild generalized body wall edema. No free fluid or free air in the abdomen or pelvis. Musculoskeletal: There are no acute or suspicious osseous abnormalities. IMPRESSION: 1. Unchanged position of left nephrostomy tube with near complete resolution of left hydronephrosis. Persistent left periureteric edema with 5 mm stone at the left ureterovesicular junction. 2. Questionable developing low-density phlegmon/abscess in the inferior left kidney, poorly defined in the  absence of IV contrast. Left perinephric edema has increased. 3. Small bilateral pleural effusions, left greater than right, slight increase in size from prior exam. 4. Hepatic steatosis. Electronically Signed   By: Keith Rake M.D.   On: 11/03/2018 00:12   Dg Chest Port 1 View  Result Date: 11/02/2018 CLINICAL DATA:  New onset fevers EXAM: PORTABLE CHEST 1 VIEW COMPARISON:  10/29/2018 FINDINGS: Cardiac shadow is prominent accentuated by the portable technique. Increasing density in the left base is noted likely related to underlying atelectasis. No other focal abnormality is seen. IMPRESSION: Increasing parenchymal opacity in the left base consistent with increasing atelectasis. Electronically Signed   By: Inez Catalina M.D.   On: 11/02/2018 12:31   Ir Nephrostomy Exchange Left  Result Date: 11/04/2018 INDICATION: 48 year old female with a history of prior left-sided percutaneous nephrostomy EXAM: IR EXCHANGE NEPHROSTOMY LEFT COMPARISON:  None. MEDICATIONS: None ANESTHESIA/SEDATION: Fentanyl 75 mcg IV; Versed 1.5 mg IV Moderate Sedation Time:  12 minutes The patient was continuously monitored during the procedure by the interventional radiology nurse under my direct supervision. CONTRAST:  79mL OMNIPAQUE IOHEXOL 300 MG/ML SOLN - administered into the collecting system(s) FLUOROSCOPY TIME:  Fluoroscopy Time: 1 minutes 18 seconds (13 mGy). COMPLICATIONS: None PROCEDURE: Informed written consent was obtained from the patient after a thorough discussion of the procedural risks, benefits and alternatives. All questions were  addressed. Maximal Sterile Barrier Technique was utilized including caps, mask, sterile gowns, sterile gloves, sterile drape, hand hygiene and skin antiseptic. A timeout was performed prior to the initiation of the procedure. Patient position prone position on the fluoroscopy table the left flank was prepped and draped in the usual sterile fashion. 1% lidocaine was used for local  anesthesia. 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 Small amount of contrast confirmed location. Catheter was sutured in position and attached to gravity drainage. Patient tolerated the procedure well and remained hemodynamically stable throughout. No complications were encountered and no significant blood loss. IMPRESSION: Status post routine exchange of left-sided percutaneous nephrostomy. Signed, Dulcy Fanny. Dellia Nims, RPVI [facility] Radiology Electronically Signed   By: Corrie Mckusick D.O.   On: 11/04/2018 08:14    Labs:  CBC: Recent Labs    11/02/18 0412 11/03/18 0614 11/04/18 0745 11/05/18 0710  WBC 10.8* 13.2* 13.0* 11.8*  HGB 11.1* 10.1* 10.5* 9.7*  HCT 33.1* 29.6* 32.0* 28.2*  PLT 118* 193 266 361    COAGS: Recent Labs    10/29/18 1443  INR 1.4*  APTT 29    BMP: Recent Labs    11/01/18 0111 11/02/18 0412 11/03/18 0614 11/04/18 0745  NA 137 137 136 138  K 3.7 3.3* 3.3* 3.5  CL 108 109 108 108  CO2 20* 21* 20* 21*  GLUCOSE 97 110* 94 106*  BUN 12 9 5* 5*  CALCIUM 8.0* 7.4* 7.7* 7.9*  CREATININE 1.21* 1.22* 0.99 0.95  GFRNONAA 54* 53* >60 >60  GFRAA >60 >60 >60 >60    LIVER FUNCTION TESTS: Recent Labs    05/24/18 1413 10/27/18 0750 10/29/18 1443 10/30/18 0017 11/01/18 0111 11/02/18 0412 11/03/18 0614 11/04/18 0745  BILITOT 0.3 0.8 1.2 1.8*  --   --   --   --   AST 18 18 25 26   --   --   --   --   ALT 13 15 20 18   --   --   --   --   ALKPHOS 63 49 71 117  --   --   --   --   PROT 7.9 7.2 6.1* 5.2*  --   --   --   --   ALBUMIN 3.8 3.8 2.8* 2.1* 1.9* 1.7* 1.7* 1.9*    Assessment and Plan:  L PCN intact OP great Feeling better Apparent fever lat night-- for CT today per Dr Lonny Prude Plan per Urology   Electronically Signed: Lavonia Drafts, PA-C 11/06/2018, 10:03 AM   I spent a total of 15 Minutes at the the patient's bedside AND on the patient's hospital floor or unit, greater than 50% of which was counseling/coordinating care for L PCN

## 2018-11-06 NOTE — Progress Notes (Signed)
Patient does not want oral contrast due to her nausea exacerbated by certain smells or taste. She asked if it can be through IV instead. RN messaged MD Lonny Prude and he stated "they are for different reasons." Order change to IV and oral contrast. Pt still does not want to drink the oral contrast so only CT with IV contrast will be done.

## 2018-11-06 NOTE — Plan of Care (Signed)
  Problem: Nutrition: Goal: Adequate nutrition will be maintained Outcome: Progressing Note: Patient continues to have periods of nausea. However, V[place]il PO PRN for nausea has been helping. No episodes of vomiting, she continues to have eat less than 50% of her meals. Patient will achieve adequate intake this shift with support of medication.

## 2018-11-07 ENCOUNTER — Encounter (HOSPITAL_COMMUNITY): Payer: Self-pay | Admitting: Radiology

## 2018-11-07 ENCOUNTER — Inpatient Hospital Stay (HOSPITAL_COMMUNITY): Payer: Managed Care, Other (non HMO)

## 2018-11-07 LAB — CBC
HCT: 30.6 % — ABNORMAL LOW (ref 36.0–46.0)
Hemoglobin: 10.4 g/dL — ABNORMAL LOW (ref 12.0–15.0)
MCH: 30 pg (ref 26.0–34.0)
MCHC: 34 g/dL (ref 30.0–36.0)
MCV: 88.2 fL (ref 80.0–100.0)
Platelets: 641 10*3/uL — ABNORMAL HIGH (ref 150–400)
RBC: 3.47 MIL/uL — ABNORMAL LOW (ref 3.87–5.11)
RDW: 13.9 % (ref 11.5–15.5)
WBC: 10.1 10*3/uL (ref 4.0–10.5)
nRBC: 0.2 % (ref 0.0–0.2)

## 2018-11-07 LAB — BASIC METABOLIC PANEL
Anion gap: 11 (ref 5–15)
BUN: <5 mg/dL — ABNORMAL LOW (ref 6–20)
CO2: 24 mmol/L (ref 22–32)
Calcium: 8.2 mg/dL — ABNORMAL LOW (ref 8.9–10.3)
Chloride: 104 mmol/L (ref 98–111)
Creatinine, Ser: 0.87 mg/dL (ref 0.44–1.00)
GFR calc Af Amer: >60 mL/min (ref >60)
GFR calc non Af Amer: >60 mL/min (ref >60)
Glucose, Bld: 110 mg/dL — ABNORMAL HIGH (ref 70–99)
Potassium: 3.4 mmol/L — ABNORMAL LOW (ref 3.5–5.1)
Sodium: 139 mmol/L (ref 135–145)

## 2018-11-07 LAB — CULTURE, BLOOD (ROUTINE X 2)
Culture: NO GROWTH
Culture: NO GROWTH
Special Requests: ADEQUATE
Special Requests: ADEQUATE

## 2018-11-07 LAB — PROTIME-INR
INR: 1.2 (ref 0.8–1.2)
Prothrombin Time: 15.2 seconds (ref 11.4–15.2)

## 2018-11-07 LAB — MAGNESIUM: Magnesium: 2 mg/dL (ref 1.7–2.4)

## 2018-11-07 MED ORDER — FENTANYL CITRATE (PF) 100 MCG/2ML IJ SOLN
INTRAMUSCULAR | Status: AC
  Filled 2018-11-07: qty 2

## 2018-11-07 MED ORDER — MIDAZOLAM HCL 2 MG/2ML IJ SOLN
INTRAMUSCULAR | Status: AC | PRN
  Administered 2018-11-07: 0.5 mg via INTRAVENOUS
  Administered 2018-11-07: 1 mg via INTRAVENOUS
  Administered 2018-11-07: 0.5 mg via INTRAVENOUS

## 2018-11-07 MED ORDER — PROMETHAZINE HCL 25 MG/ML IJ SOLN
INTRAMUSCULAR | Status: AC
  Filled 2018-11-07: qty 1

## 2018-11-07 MED ORDER — MIDAZOLAM HCL 2 MG/2ML IJ SOLN
INTRAMUSCULAR | Status: AC
  Filled 2018-11-07: qty 2

## 2018-11-07 MED ORDER — PROMETHAZINE HCL 25 MG/ML IJ SOLN
INTRAMUSCULAR | Status: AC | PRN
  Administered 2018-11-07: 25 mg via INTRAVENOUS

## 2018-11-07 MED ORDER — LIDOCAINE HCL (PF) 1 % IJ SOLN
INTRAMUSCULAR | Status: AC
  Filled 2018-11-07: qty 30

## 2018-11-07 MED ORDER — SODIUM CHLORIDE 0.9% FLUSH
5.0000 mL | Freq: Three times a day (TID) | INTRAVENOUS | Status: DC
  Administered 2018-11-07 – 2018-11-09 (×7): 5 mL

## 2018-11-07 MED ORDER — FENTANYL CITRATE (PF) 100 MCG/2ML IJ SOLN
INTRAMUSCULAR | Status: AC | PRN
  Administered 2018-11-07 (×2): 25 ug via INTRAVENOUS
  Administered 2018-11-07: 50 ug via INTRAVENOUS

## 2018-11-07 NOTE — Progress Notes (Signed)
PROGRESS NOTE    Tamara Watson  [id number] DOB: [birth year] DOA: 10/29/2018 PCP: Haywood Pao, MD   Brief Narrative: Tamara Watson is a 48 y.o. female that presented secondary to sepsis in setting of pylonephritis and bacteremia from klebsiella pneumoniae infection. On antibiotics. S/p perc drain. Repeat abdominal CT suggesting another abscess.   Assessment & Plan:   Active Problems:   Sepsis ([place])   Pyelonephritis   Left ureteral stone   Tachycardia   Fever   Renal abscess, left   Nausea   Sepsis Secondary to Klebsiella bacteremia and pyelonephritis. Present on admission.  Bacteremia Klebsiella pneumoniae on 9/27 Bcx. Repeat blood cultures on 10/1 no growth to date. Initially treated with Vancomycin/cefepime, transitioned to cefazolin and now changed to Ceftriaxone. -Continue Ceftriaxone  Pyelonephritis Renal abscess In setting of nephrolithiasis. Patient underwent left percutaneous nephrostomy tube insertion on 9/27 by IR. Tube exchanged on 10/2 by IR. Urology on board. Patient with persistent fevers. Repeat abdomen CT significant for possible new abscess. -Continue antibiotics as mentioned above -Discussed with IR for possible management of new fluid collection -Urology recommendations  Nausea/vomiting Somewhat improved. -Continue phenergan prn -Continue Zofran and hydroxyzine prn  AKI Noted in chart. Does not meet criteria for AKI  Edema Likely iatrogenic from continuous IV fluids. Improved after stopping IV fluids.  Nutrition Patient has had very low oral intake and has been on IV fluids for about one week. Will obtain labs to ensure no development of refeeding syndrome -Daily RFP, magnesium   DVT prophylaxis: SCDs Code Status:   Code Status: Full Code Family Communication: None at bedside Disposition Plan: Discharge home pending continued IR management and transition to oral antibiotics if able   Consultants:    Interventional radiology  Urology  Procedures:   9/27: left percutaneous nephrostomy tube insertion  10/2: left percutaneous nephrostomy tube exchange  Antimicrobials:  Vancomycin  Cefepime  Ancef  Ceftriaxone    Subjective: Some abdominal pain. Nausea when attempting to eat.  Objective: Vitals:   11/06/18 1640 11/06/18 2100 11/07/18 0448 11/07/18 0500  BP: (!) 144/65 129/86 (!) 154/85   Pulse: 79 77    Resp: 20 20 20    Temp: 98.6 F (37 C) 98.7 F (37.1 C) 100 F (37.8 C)   TempSrc: Oral Oral Oral   SpO2:  100%    Weight:    99.2 kg  Height:        Intake/Output Summary (Last 24 hours) at 11/07/2018 0825 Last data filed at 11/07/2018 0700 Gross per 24 hour  Intake 248 ml  Output 6300 ml  Net -6052 ml   Filed Weights   11/05/18 0500 11/06/18 0445 11/07/18 0500  Weight: 99.2 kg 97.6 kg 99.2 kg    Examination:  General exam: Appears calm and comfortable Respiratory system: Clear to auscultation. Respiratory effort normal. Cardiovascular system: S1 & S2 heard, RRR. No murmurs, rubs, gallops or clicks. Gastrointestinal system: Abdomen is nondistended, soft and mildly tender in LUQ. No organomegaly or masses felt. Normal bowel sounds heard. Central nervous system: Alert and oriented. No focal neurological deficits. Extremities: Trace edema. No calf tenderness Skin: No cyanosis. No rashes Psychiatry: Judgement and insight appear normal. Mood & affect appropriate.     Data Reviewed: I have personally reviewed following labs and imaging studies  CBC: Recent Labs  Lab 11/02/18 0412 11/03/18 0614 11/04/18 0745 11/05/18 0710 11/06/18 0957  WBC 10.8* 13.2* 13.0* 11.8* 10.1  NEUTROABS  --  10.0* 10.4* 9.3*  --  HGB 11.1* 10.1* 10.5* 9.7* 9.7*  HCT 33.1* 29.6* 32.0* 28.2* 29.8*  MCV 87.8 87.6 89.1 87.9 89.2  PLT 118* 193 266 361 [id number]*   Basic Metabolic Panel: Recent Labs  Lab 11/01/18 0111 11/02/18 0412 11/03/18 0614 11/04/18 0745 11/06/18 0957   NA 137 137 136 138 141  K 3.7 3.3* 3.3* 3.5 3.2*  CL 108 109 108 108 109  CO2 20* 21* 20* 21* 23  GLUCOSE 97 110* 94 106* 111*  BUN 12 9 5* 5* <5*  CREATININE 1.21* 1.22* 0.99 0.95 0.85  CALCIUM 8.0* 7.4* 7.7* 7.9* 7.9*  MG  --   --   --   --  1.9  PHOS 2.5 2.4* 2.8 3.0 2.6   GFR: Estimated Creatinine Clearance: 98.4 mL/min (by C-G formula based on SCr of 0.85 mg/dL). Liver Function Tests: Recent Labs  Lab 11/01/18 0111 11/02/18 0412 11/03/18 0614 11/04/18 0745 11/06/18 0957  ALBUMIN 1.9* 1.7* 1.7* 1.9* 2.1*   No results for input(s): LIPASE, AMYLASE in the last 168 hours. No results for input(s): AMMONIA in the last 168 hours. Coagulation Profile: No results for input(s): INR, PROTIME in the last 168 hours. Cardiac Enzymes: No results for input(s): CKTOTAL, CKMB, CKMBINDEX, TROPONINI in the last 168 hours. BNP (last 3 results) No results for input(s): PROBNP in the last 8760 hours. HbA1C: No results for input(s): HGBA1C in the last 72 hours. CBG: No results for input(s): GLUCAP in the last 168 hours. Lipid Profile: No results for input(s): CHOL, HDL, LDLCALC, TRIG, CHOLHDL, LDLDIRECT in the last 72 hours. Thyroid Function Tests: No results for input(s): TSH, T4TOTAL, FREET4, T3FREE, THYROIDAB in the last 72 hours. Anemia Panel: No results for input(s): VITAMINB12, FOLATE, FERRITIN, TIBC, IRON, RETICCTPCT in the last 72 hours. Sepsis Labs: No results for input(s): PROCALCITON, LATICACIDVEN in the last 168 hours.  Recent Results (from the past 240 hour(s))  SARS Coronavirus 2 [facility] order, Performed in [facility] hospital lab) Nasopharyngeal Nasopharyngeal Swab     Status: None   Collection Time: 10/29/18  2:09 PM   Specimen: Nasopharyngeal Swab  Result Value Ref Range Status   SARS Coronavirus 2 NEGATIVE NEGATIVE Final    Comment: (NOTE) If result is NEGATIVE SARS-CoV-2 target nucleic acids are NOT DETECTED. The SARS-CoV-2 RNA is generally detectable in  upper and lower  respiratory specimens during the acute phase of infection. The lowest  concentration of SARS-CoV-2 viral copies this assay can detect is 250  copies / mL. A negative result does not preclude SARS-CoV-2 infection  and should not be used as the sole basis for treatment or other  patient management decisions.  A negative result may occur with  improper specimen collection / handling, submission of specimen other  than nasopharyngeal swab, presence of viral mutation(s) within the  areas targeted by this assay, and inadequate number of viral copies  (<250 copies / mL). A negative result must be combined with clinical  observations, patient history, and epidemiological information. If result is POSITIVE SARS-CoV-2 target nucleic acids are DETECTED. The SARS-CoV-2 RNA is generally detectable in upper and lower  respiratory specimens dur ing the acute phase of infection.  Positive  results are indicative of active infection with SARS-CoV-2.  Clinical  correlation with patient history and other diagnostic information is  necessary to determine patient infection status.  Positive results do  not rule out bacterial infection or co-infection with other viruses. If result is PRESUMPTIVE POSTIVE SARS-CoV-2 nucleic acids MAY BE PRESENT.  A presumptive positive result was obtained on the submitted specimen  and confirmed on repeat testing.  While 2019 novel coronavirus  (SARS-CoV-2) nucleic acids may be present in the submitted sample  additional confirmatory testing may be necessary for epidemiological  and / or clinical management purposes  to differentiate between  SARS-CoV-2 and other Sarbecovirus currently known to infect humans.  If clinically indicated additional testing with an alternate test  methodology [phone]) is advised. The SARS-CoV-2 RNA is generally  detectable in upper and lower respiratory sp ecimens during the acute  phase of infection. The expected result is  Negative. Fact Sheet for Patients:  StrictlyIdeas.no Fact Sheet for Healthcare Providers: BankingDealers.co.za This test is not yet approved or cleared by the [place] FDA and has been authorized for detection and/or diagnosis of SARS-CoV-2 by FDA under an Emergency Use Authorization (EUA).  This EUA will remain in effect (meaning this test can be used) for the duration of the COVID-19 declaration under Section 564(b)(1) of the Act, 21 U.S.C. section 360bbb-3(b)(1), unless the authorization is terminated or revoked sooner. Performed at [facility], [place] [address], [place], [place] [address]   Urine culture     Status: Abnormal   Collection Time: 10/29/18  2:21 PM   Specimen: In/Out Cath Urine  Result Value Ref Range Status   Specimen Description IN/OUT CATH URINE  Final   Special Requests   Final    NONE Performed at [facility], [place] [address], [place], [place] [address]    Culture 50,000 COLONIES/mL KLEBSIELLA PNEUMONIAE (A)  Final   Report Status 10/31/2018 FINAL  Final   Organism ID, Bacteria KLEBSIELLA PNEUMONIAE (A)  Final      Susceptibility   Klebsiella pneumoniae - MIC*    AMPICILLIN >=32 RESISTANT Resistant     CEFAZOLIN <=4 SENSITIVE Sensitive     CEFTRIAXONE <=1 SENSITIVE Sensitive     CIPROFLOXACIN <=0.25 SENSITIVE Sensitive     GENTAMICIN <=1 SENSITIVE Sensitive     IMIPENEM 1 SENSITIVE Sensitive     NITROFURANTOIN 128 RESISTANT Resistant     TRIMETH/SULFA <=20 SENSITIVE Sensitive     AMPICILLIN/SULBACTAM 4 SENSITIVE Sensitive     PIP/TAZO <=4 SENSITIVE Sensitive     Extended ESBL NEGATIVE Sensitive     * 50,000 COLONIES/mL KLEBSIELLA PNEUMONIAE  Blood Culture (routine x 2)     Status: Abnormal   Collection Time: 10/29/18  2:26 PM   Specimen: BLOOD  Result Value Ref Range Status   Specimen Description BLOOD RIGHT ANTECUBITAL  Final   Special Requests   Final    BOTTLES DRAWN AEROBIC AND  ANAEROBIC Blood Culture adequate volume   Culture  Setup Time   Final    GRAM NEGATIVE RODS IN BOTH AEROBIC AND ANAEROBIC BOTTLES CRITICAL RESULT CALLED TO, READ BACK BY AND VERIFIED WITH: T. EGAN,PHARMD 0423 10/30/2018 T. TYSOR    Culture (A)  Final    KLEBSIELLA PNEUMONIAE SUSCEPTIBILITIES PERFORMED ON PREVIOUS CULTURE WITHIN THE LAST 5 DAYS. Performed at [facility], [place] [address] [place], [place] [address]    Report Status 11/01/2018 FINAL  Final  Blood Culture (routine x 2)     Status: Abnormal   Collection Time: 10/29/18  3:23 PM   Specimen: BLOOD  Result Value Ref Range Status   Specimen Description BLOOD LEFT ANTECUBITAL  Final   Special Requests   Final    BOTTLES DRAWN AEROBIC AND ANAEROBIC Blood Culture results may not be optimal due  to an inadequate volume of blood received in culture bottles   Culture  Setup Time   Final    GRAM NEGATIVE RODS IN BOTH AEROBIC AND ANAEROBIC BOTTLES CRITICAL RESULT CALLED TO, READ BACK BY AND VERIFIED WITH: TDonzetta Kohut [id number] 10/30/2018 Mena Goes Performed at [facility], [place] [address], [place], [place] [address]    Culture KLEBSIELLA PNEUMONIAE (A)  Final   Report Status 11/01/2018 FINAL  Final   Organism ID, Bacteria KLEBSIELLA PNEUMONIAE  Final      Susceptibility   Klebsiella pneumoniae - MIC*    AMPICILLIN >=32 RESISTANT Resistant     CEFAZOLIN <=4 SENSITIVE Sensitive     CEFEPIME <=1 SENSITIVE Sensitive     CEFTAZIDIME <=1 SENSITIVE Sensitive     CEFTRIAXONE <=1 SENSITIVE Sensitive     CIPROFLOXACIN <=0.25 SENSITIVE Sensitive     GENTAMICIN <=1 SENSITIVE Sensitive     IMIPENEM 0.5 SENSITIVE Sensitive     TRIMETH/SULFA <=20 SENSITIVE Sensitive     AMPICILLIN/SULBACTAM 4 SENSITIVE Sensitive     PIP/TAZO <=4 SENSITIVE Sensitive     Extended ESBL NEGATIVE Sensitive     * KLEBSIELLA PNEUMONIAE  Blood Culture ID Panel (Reflexed)     Status: Abnormal   Collection Time: 10/29/18  3:23 PM  Result Value Ref Range  Status   Enterococcus species NOT DETECTED NOT DETECTED Final   Listeria monocytogenes NOT DETECTED NOT DETECTED Final   Staphylococcus species NOT DETECTED NOT DETECTED Final   Staphylococcus aureus (BCID) NOT DETECTED NOT DETECTED Final   Streptococcus species NOT DETECTED NOT DETECTED Final   Streptococcus agalactiae NOT DETECTED NOT DETECTED Final   Streptococcus pneumoniae NOT DETECTED NOT DETECTED Final   Streptococcus pyogenes NOT DETECTED NOT DETECTED Final   Acinetobacter baumannii NOT DETECTED NOT DETECTED Final   Enterobacteriaceae species DETECTED (A) NOT DETECTED Final    Comment: Enterobacteriaceae represent a large family of gram-negative bacteria, not a single organism. CRITICAL RESULT CALLED TO, READ BACK BY AND VERIFIED WITH: T. EGAN,PHARMD 0423 10/30/2018 T. TYSOR    Enterobacter cloacae complex NOT DETECTED NOT DETECTED Final   Escherichia coli NOT DETECTED NOT DETECTED Final   Klebsiella oxytoca NOT DETECTED NOT DETECTED Final   Klebsiella pneumoniae DETECTED (A) NOT DETECTED Final    Comment: CRITICAL RESULT CALLED TO, READ BACK BY AND VERIFIED WITH: T. EGAN,PHARMD 0423 10/30/2018 T. TYSOR    Proteus species NOT DETECTED NOT DETECTED Final   Serratia marcescens NOT DETECTED NOT DETECTED Final   Carbapenem resistance NOT DETECTED NOT DETECTED Final   Haemophilus influenzae NOT DETECTED NOT DETECTED Final   Neisseria meningitidis NOT DETECTED NOT DETECTED Final   Pseudomonas aeruginosa NOT DETECTED NOT DETECTED Final   Candida albicans NOT DETECTED NOT DETECTED Final   Candida glabrata NOT DETECTED NOT DETECTED Final   Candida krusei NOT DETECTED NOT DETECTED Final   Candida parapsilosis NOT DETECTED NOT DETECTED Final   Candida tropicalis NOT DETECTED NOT DETECTED Final    Comment: Performed at [facility], [place]. [address] [place], [place] [address]  Urine Culture     Status: Abnormal   Collection Time: 10/29/18  7:09 PM   Specimen: Kidney, Left; Urine   Result Value Ref Range Status   Specimen Description KIDNEY LEFT URINE  Final   Special Requests   Final    A Performed at [facility], [address], [place], [place] [address]    Culture >=100,000 COLONIES/mL KLEBSIELLA PNEUMONIAE (A)  Final  Report Status 10/31/2018 FINAL  Final   Organism ID, Bacteria KLEBSIELLA PNEUMONIAE (A)  Final      Susceptibility   Klebsiella pneumoniae - MIC*    AMPICILLIN >=32 RESISTANT Resistant     CEFAZOLIN <=4 SENSITIVE Sensitive     CEFEPIME <=1 SENSITIVE Sensitive     CEFTAZIDIME <=1 SENSITIVE Sensitive     CEFTRIAXONE <=1 SENSITIVE Sensitive     CIPROFLOXACIN <=0.25 SENSITIVE Sensitive     GENTAMICIN <=1 SENSITIVE Sensitive     IMIPENEM 0.5 SENSITIVE Sensitive     TRIMETH/SULFA <=20 SENSITIVE Sensitive     AMPICILLIN/SULBACTAM 4 SENSITIVE Sensitive     PIP/TAZO <=4 SENSITIVE Sensitive     Extended ESBL NEGATIVE Sensitive     * >=100,000 COLONIES/mL KLEBSIELLA PNEUMONIAE  MRSA PCR Screening     Status: None   Collection Time: 10/30/18  8:30 AM   Specimen: Nasopharyngeal  Result Value Ref Range Status   MRSA by PCR NEGATIVE NEGATIVE Final    Comment:        The GeneXpert MRSA Assay (FDA approved for NASAL specimens only), is one component of a comprehensive MRSA colonization surveillance program. It is not intended to diagnose MRSA infection nor to guide or monitor treatment for MRSA infections. Performed at [facility], [place] [address], [place], [place] [address]   Culture, blood (routine x 2)     Status: None (Preliminary result)   Collection Time: 11/02/18 12:41 PM   Specimen: BLOOD  Result Value Ref Range Status   Specimen Description BLOOD RIGHT ANTECUBITAL  Final   Special Requests   Final    BOTTLES DRAWN AEROBIC AND ANAEROBIC Blood Culture adequate volume   Culture   Final    NO GROWTH 4 DAYS Performed at [facility], [place] [address], [place], [place] [address]    Report Status PENDING  Incomplete   Culture, blood (routine x 2)     Status: None (Preliminary result)   Collection Time: 11/02/18 12:41 PM   Specimen: BLOOD  Result Value Ref Range Status   Specimen Description BLOOD LEFT ANTECUBITAL  Final   Special Requests   Final    BOTTLES DRAWN AEROBIC AND ANAEROBIC Blood Culture adequate volume   Culture   Final    NO GROWTH 4 DAYS Performed at [facility], [place] [address], [place], [place] [address]    Report Status PENDING  Incomplete  Culture, Urine     Status: None   Collection Time: 11/03/18 12:10 AM   Specimen: Urine, Random  Result Value Ref Range Status   Specimen Description URINE, RANDOM  Final   Special Requests NONE  Final   Culture   Final    NO GROWTH Performed at [facility], [place] [address], [place], [place] [address]    Report Status 11/03/2018 FINAL  Final         Radiology Studies: Ct Abdomen Pelvis W Contrast  Result Date: 11/06/2018 CLINICAL DATA:  Abdominal pain, fever, abscess suspected. History of LEFT percutaneous nephrostomy tube insertion on September 27th. Tube exchanged on October 2nd. EXAM: CT ABDOMEN AND PELVIS WITH CONTRAST TECHNIQUE: Multidetector CT imaging of the abdomen and pelvis was performed using the standard protocol following bolus administration of intravenous contrast. CONTRAST:  122mL OMNIPAQUE IOHEXOL 300 MG/ML  SOLN COMPARISON:  CT abdomen dated 11/02/2018. FINDINGS: Lower chest: Small patchy consolidations at both lung bases, likely atelectasis. Small bilateral pleural effusions. Hepatobiliary: No focal liver abnormality is seen.  No gallstones, gallbladder wall thickening, or biliary dilatation. Pancreas: Unremarkable. No pancreatic ductal dilatation or surrounding inflammatory changes. Spleen: Normal in size without focal abnormality. Adrenals/Urinary Tract: LEFT-sided percutaneous nephrostomy tube in place. Tube appears appropriately positioned, terminating in the LEFT renal pelvis. No hydronephrosis. Complex  hypodense collection within the posterior cortex of the lower LEFT kidney, measuring 3.6 x 2.8 x 4.3 cm (transverse by AP by craniocaudal dimensions), abscess versus hematoma. RIGHT kidney appears normal. No ureteral calculi appreciated. Bladder is unremarkable. Stomach/Bowel: No dilated large or small bowel loops. No evidence of bowel wall inflammation seen. Stomach is unremarkable. Vascular/Lymphatic: No significant vascular findings are present. No enlarged abdominal or pelvic lymph nodes. Reproductive: Presumed hysterectomy. Other: No free intraperitoneal air. Musculoskeletal: No acute or suspicious osseous finding. Ill-defined fluid/edema within the subcutaneous soft tissues of the lower abdomen and pelvis indicating anasarca. IMPRESSION: 1. Complex hypodense collection within the posterior cortex of the lower LEFT kidney, measuring 3.6 x 2.8 x 4.3 cm, possible abscess, alternatively liquified hematoma related to the earlier nephrostomy tube placement. There is no air within the collection to confirm renal cortex abscess. 2. LEFT-sided percutaneous nephrostomy tube appears appropriately positioned, terminating in the LEFT renal pelvis. No hydronephrosis. 3. Small patchy consolidations at both lung bases, likely atelectasis. Small bilateral pleural effusions. 4. Anasarca. Electronically Signed   By: Franki Cabot M.D.   On: 11/06/2018 19:43        Scheduled Meds: . acetaminophen  1,000 mg Oral Q6H  . Chlorhexidine Gluconate Cloth  6 each Topical Daily  . mouth rinse  15 mL Mouth Rinse BID  . nystatin  5 mL Mouth/Throat QID  . pantoprazole  40 mg Oral Daily  . polyethylene glycol  17 g Oral Daily  . senna-docusate  2 tablet Oral QHS  . sodium chloride flush  3 mL Intravenous Q12H  . sodium chloride flush  5 mL Intracatheter Q8H  . tamsulosin  0.4 mg Oral Daily   Continuous Infusions: . sodium chloride    . cefTRIAXone (ROCEPHIN)  IV 2 g (11/06/18 2251)  . ondansetron [facility]) IV 8 mg  (11/04/18 2233)     LOS: 9 days     Cordelia Poche, MD [facility] 11/07/2018, 8:25 AM  If 7PM-7AM, please contact night-coverage [email or web address]

## 2018-11-07 NOTE — Sedation Documentation (Signed)
New stat locks secured to both drain. Both drains secured with sutures and dressed with guaze and tape.

## 2018-11-07 NOTE — Procedures (Signed)
Interventional Radiology Procedure Note  Procedure: US guided left lower pole renal abscess drainage.  48F drain placed..  Complications: None Recommendations:  - to bulb suction - follow up culture - Do not submerge - Routine drain care   Signed,  Dulcy Fanny. Earleen Newport, DO

## 2018-11-07 NOTE — Consult Note (Signed)
Chief Complaint: Patient was seen in consultation today for left renal abscess drain placement Chief Complaint  Patient presents with   Fever   Nephrolithiasis   at the request of Dr Lonny Prude  Supervising Physician: Corrie Mckusick  Patient Status: [facility] - In-pt  History of Present Illness: Tamara Watson is a 48 y.o. female   Left PCN placed 10/29/18 Exchanged 10/2 Has had pain off and on with this PCN since placement Although last few days has seemed so much better Spiked temp last 2 nights CT Abd: yesterday: IMPRESSION: 1. Complex hypodense collection within the posterior cortex of the lower LEFT kidney, measuring 3.6 x 2.8 x 4.3 cm, possible abscess, alternatively liquified hematoma related to the earlier nephrostomy tube placement. There is no air within the collection to confirm renal cortex abscess. 2. LEFT-sided percutaneous nephrostomy tube appears appropriately positioned, terminating in the LEFT renal pelvis. No hydronephrosis. 3. Small patchy consolidations at both lung bases, likely atelectasis. Small bilateral pleural effusions. 4. Anasarca.  Dr Earleen Newport has reviewed imaging and approves abscess drain placement  Past Medical History:  Diagnosis Date   Allergy    Anemia    Anxiety    in school only    Constipation    sennocot and miralax    Dyspnea    when hemoglobin low   GERD (gastroesophageal reflux disease)    Headache    due to Iron deficency    Past Surgical History:  Procedure Laterality Date   BREAST LUMPECTOMY Left    20 yrs ago- benign    CYSTOSCOPY  06/14/2018   Procedure: Cystoscopy;  Surgeon: Waymon Amato, MD;  Location: [place];  Service: Gynecology;;   DILATION AND CURETTAGE OF UTERUS     x2   HYSTERECTOMY ABDOMINAL WITH SALPINGECTOMY Bilateral 06/14/2018   Procedure: HYSTERECTOMY ABDOMINAL WITH  BILATERAL SALPINGECTOMY;  Surgeon: Waymon Amato, MD;  Location: [place];  Service: Gynecology;  Laterality: Bilateral;   IR  NEPHROSTOMY EXCHANGE LEFT  11/03/2018   IR NEPHROSTOMY PLACEMENT LEFT  10/29/2018   NSVD     x3   TUBAL LIGATION      Allergies: Patient has no known allergies.  Medications: Prior to Admission medications   Medication Sig Start Date End Date Taking? Authorizing Provider  acetaminophen (TYLENOL) 500 MG tablet Take 1,000 mg by mouth every 6 (six) hours as needed for headache.   Yes [provider]  dexlansoprazole (DEXILANT) 60 MG capsule TAKE 1 CAPSULE BY MOUTH DAILY BEFORE BREAKFAST. Patient taking differently: Take 60 mg by mouth daily before breakfast.  08/01/18  Yes Nandigam, Venia Minks, MD  fluticasone (FLONASE) 50 MCG/ACT nasal spray Place 2 sprays into both nostrils daily. Patient taking differently: Place 2 sprays into both nostrils daily as needed for allergies.  11/08/16  Yes Rogue Bussing, MD  ibuprofen (ADVIL) 200 MG tablet Take 600 mg by mouth every 6 (six) hours as needed for headache (pain).   Yes [provider]  ondansetron (ZOFRAN) 4 MG tablet Take 1 tablet (4 mg total) by mouth every 6 (six) hours. 10/27/18  Yes Alveria Apley, PA-C  oxyCODONE-acetaminophen (PERCOCET/ROXICET) 5-325 MG tablet Take 1-2 tablets by mouth every 6 (six) hours as needed for moderate pain (once the patient tolerates po and PCA has been discontinued). Patient taking differently: Take 1-2 tablets by mouth every 6 (six) hours as needed for moderate pain.  06/16/18  Yes Waymon Amato, MD  senna-docusate (SENOKOT-S) 8.6-50 MG tablet Take 2 tablets by mouth at  bedtime as needed for mild constipation or moderate constipation. 06/16/18  Yes Waymon Amato, MD  tamsulosin (FLOMAX) 0.4 MG CAPS capsule Take 1 capsule (0.4 mg total) by mouth daily. 10/27/18 11/26/18 Yes Madilyn Hook A, PA-C  vitamin C (ASCORBIC ACID) 500 MG tablet Take 500 mg by mouth daily as needed (immune system boost/ cold symptoms).    Yes [provider]  benzonatate (TESSALON) 100 MG capsule Take 1 capsule  (100 mg total) by mouth 3 (three) times daily as needed for cough. Patient not taking: Reported on 10/29/2018 01/09/18   Seward Bing, DO  docusate sodium (COLACE) 100 MG capsule Take 1 capsule (100 mg total) by mouth 2 (two) times daily. Patient not taking: Reported on 10/29/2018 06/16/18   Waymon Amato, MD  ibuprofen (ADVIL) 600 MG tablet Take 1 tablet (600 mg total) by mouth every 6 (six) hours. Patient not taking: Reported on 10/29/2018 06/19/18   Waymon Amato, MD  lidocaine (LIDODERM) 5 % Place 1 patch onto the skin daily. Remove & Discard patch within 12 hours or as directed by MD Patient not taking: Reported on 10/29/2018 06/16/18   Waymon Amato, MD  LINZESS 72 MCG capsule TAKE 1 CAPSULE (72 MCG TOTAL) BY MOUTH DAILY BEFORE BREAKFAST. Patient not taking: No sig reported 11/03/17   Mauri Pole, MD  pantoprazole (PROTONIX) 40 MG tablet Take 1 tablet (40 mg total) by mouth daily. Patient not taking: Reported on 10/29/2018 06/17/18   Waymon Amato, MD     Family History  Problem Relation Age of Onset   Cancer Maternal Aunt    Ovarian cancer Maternal Aunt    Cancer Paternal Grandmother    Pancreatic cancer Paternal Grandmother    Hyperlipidemia Mother    Diabetes Mother    Hyperlipidemia Father    Breast cancer Maternal Aunt    Colon cancer Neg Hx    Esophageal cancer Neg Hx    Stomach cancer Neg Hx    Rectal cancer Neg Hx    Colon polyps Neg Hx     Social History   Socioeconomic History   Marital status: Married    Spouse name: Not on file   Number of children: 3   Years of education: Not on file   Highest education level: Not on file  Occupational History   Not on file  Social Needs   Financial resource strain: Not on file   Food insecurity    Worry: Not on file    Inability: Not on file   Transportation needs    Medical: Not on file    Non-medical: Not on file  Tobacco Use   Smoking status: Never Smoker   Smokeless tobacco: Never Used    Substance and Sexual Activity   Alcohol use: Yes    Alcohol/week: 1.0 standard drinks    Types: 1 Glasses of wine per week    Frequency: Never    Comment: ocassional   Drug use: Never   Sexual activity: Yes  Lifestyle   Physical activity    Days per week: Not on file    Minutes per session: Not on file   Stress: Not on file  Relationships   Social connections    Talks on phone: Not on file    Gets together: Not on file    Attends religious service: Not on file    Active member of club or organization: Not on file    Attends meetings of clubs or organizations: Not on file  Relationship status: Not on file  Other Topics Concern   Not on file  Social History Narrative   Not on file    Review of Systems: A 12 point ROS discussed and pertinent positives are indicated in the HPI above.  All other systems are negative.  Review of Systems  Constitutional: Negative for fatigue and fever.  Respiratory: Negative for shortness of breath.   Cardiovascular: Negative for chest pain.  Gastrointestinal: Negative for abdominal pain.  Neurological: Negative for weakness.  Psychiatric/Behavioral: Negative for behavioral problems and confusion.    Vital Signs: BP (!) 154/80 (BP Location: Left Arm)    Pulse 80    Temp 98.8 F (37.1 C) (Oral)    Resp 20    Ht 5' 5.5" (1.664 m)    Wt 218 lb 11.1 oz (99.2 kg)    LMP 06/05/2018    SpO2 100%    BMI 35.84 kg/m   Physical Exam Vitals signs reviewed.  Cardiovascular:     Rate and Rhythm: Normal rate and regular rhythm.     Heart sounds: Normal heart sounds.  Pulmonary:     Breath sounds: Normal breath sounds.  Abdominal:     Palpations: Abdomen is soft.  Musculoskeletal: Normal range of motion.  Skin:    General: Skin is warm and dry.     Comments: Site of PCN is clean and dry NT no bleeding  Neurological:     Mental Status: She is alert and oriented to person, place, and time.  Psychiatric:        Mood and Affect: Mood  normal.        Behavior: Behavior normal.        Thought Content: Thought content normal.        Judgment: Judgment normal.     Imaging: Ct Abdomen Pelvis Wo Contrast  Result Date: 11/03/2018 CLINICAL DATA:  Complicated pyelonephritis. Left nephrostomy tube in place draining blood tinged fluid. EXAM: CT ABDOMEN AND PELVIS WITHOUT CONTRAST TECHNIQUE: Multidetector CT imaging of the abdomen and pelvis was performed following the standard protocol without IV contrast. COMPARISON:  CT 10/30/2018, 10/29/2018, additional priors. FINDINGS: Lower chest: Small bilateral pleural effusions, left greater than right, slight increase from prior exam. Associated atelectasis. Hepatobiliary: Hepatic steatosis. Gallbladder physiologically distended, no calcified stone. No biliary dilatation. Pancreas: No ductal dilatation or inflammation. Spleen: Normal in size without focal abnormality. Adrenals/Urinary Tract: Normal adrenal glands. Left nephrostomy tube unchanged position with tip in the renal pelvis. No abnormality along the course of the nephrostomy tubing. Left hydronephrosis has near completely resolved. Persistent and slight worsening left perinephric edema. Questionable developing low-density region in the inferior left kidney, series 3, image 37. There is persistent left periureteric edema with 5 mm stone at the left ureterovesicular junction. Urinary bladder physiologically distended. No bladder stone. No right hydronephrosis or urolithiasis. Stomach/Bowel: Stomach is within normal limits. Appendix appears normal. No evidence of bowel wall thickening, distention, or inflammatory changes. Administered enteric contrast reaches the distal colon. Vascular/Lymphatic: Abdominal aorta is normal in caliber. Probable small left retroperitoneal nodes not well assessed. No bulky abdominopelvic adenopathy. Reproductive: 2.8 cm cyst in the right ovary is unchanged in size, probable fluid level suggesting hemorrhagic cyst. Left  ovary is unremarkable. Post hysterectomy. Other: Mild generalized body wall edema. No free fluid or free air in the abdomen or pelvis. Musculoskeletal: There are no acute or suspicious osseous abnormalities. IMPRESSION: 1. Unchanged position of left nephrostomy tube with near complete resolution of left hydronephrosis. Persistent left  periureteric edema with 5 mm stone at the left ureterovesicular junction. 2. Questionable developing low-density phlegmon/abscess in the inferior left kidney, poorly defined in the absence of IV contrast. Left perinephric edema has increased. 3. Small bilateral pleural effusions, left greater than right, slight increase in size from prior exam. 4. Hepatic steatosis. Electronically Signed   By: Keith Rake M.D.   On: 11/03/2018 00:12   Ct Abdomen Pelvis Wo Contrast  Result Date: 10/30/2018 CLINICAL DATA:  Flank pain. Pain after left percutaneous nephrostomy tube placement 1 flushing catheter. EXAM: CT ABDOMEN AND PELVIS WITHOUT CONTRAST TECHNIQUE: Multidetector CT imaging of the abdomen and pelvis was performed following the standard protocol without IV contrast. COMPARISON:  CT yesterday. FINDINGS: Lower chest: Left pleural effusion with adjacent atelectasis, minimally increased from prior. Trace right pleural effusion is new. Hepatobiliary: No focal hepatic abnormality on noncontrast exam. Mild steatosis. Prominent size liver spanning 22 cm cranial caudal. Gallbladder physiologically distended, no calcified stone. No biliary dilatation. Pancreas: No ductal dilatation or inflammation. Spleen: Normal in size without focal abnormality. Adrenals/Urinary Tract: Normal adrenal glands. Percutaneous left nephrostomy tube with pigtail in the renal pelvis. No fluid collection or abnormality along the course of the tubing. Persistent but slight decreased left hydronephrosis from prior exam. Persistent left hydroureter and periureteric stranding with obstructing 5 mm stone in the distal  ureter. The stone is not significantly changed in position from prior exam. No evidence of perinephric or intra nephric fluid collection. No right hydronephrosis. No right urolithiasis. Right ureter is decompressed. Urinary bladder is minimally distended. No bladder stone. Stomach/Bowel: Stomach is within normal limits. Appendix appears normal. No evidence of bowel wall thickening, distention, or inflammatory changes. Vascular/Lymphatic: Abdominal aorta is normal in caliber. No bulky abdominopelvic adenopathy. Reproductive: Unchanged from prior. Similar 3 cm right ovarian cyst. Prior hysterectomy. Other: Trace free fluid in the pelvis. No free air. No evidence of intra-abdominal abscess. Musculoskeletal: There are no acute or suspicious osseous abnormalities. IMPRESSION: 1. Percutaneous left nephrostomy tube with pigtail in the renal pelvis. No evidence of complication. Decreased hydronephrosis from prior exam. Unchanged obstructing 5 mm stone in the distal left ureter. 2. Left pleural effusion with adjacent atelectasis, minimally increased from prior. Trace right pleural effusion is new. 3. Mild hepatic steatosis and hepatomegaly. 4. Unchanged 3 cm right ovarian cyst. Electronically Signed   By: Keith Rake M.D.   On: 10/30/2018 23:39   Ct Abdomen Pelvis W Contrast  Result Date: 11/06/2018 CLINICAL DATA:  Abdominal pain, fever, abscess suspected. History of LEFT percutaneous nephrostomy tube insertion on September 27th. Tube exchanged on October 2nd. EXAM: CT ABDOMEN AND PELVIS WITH CONTRAST TECHNIQUE: Multidetector CT imaging of the abdomen and pelvis was performed using the standard protocol following bolus administration of intravenous contrast. CONTRAST:  114mL OMNIPAQUE IOHEXOL 300 MG/ML  SOLN COMPARISON:  CT abdomen dated 11/02/2018. FINDINGS: Lower chest: Small patchy consolidations at both lung bases, likely atelectasis. Small bilateral pleural effusions. Hepatobiliary: No focal liver abnormality  is seen. No gallstones, gallbladder wall thickening, or biliary dilatation. Pancreas: Unremarkable. No pancreatic ductal dilatation or surrounding inflammatory changes. Spleen: Normal in size without focal abnormality. Adrenals/Urinary Tract: LEFT-sided percutaneous nephrostomy tube in place. Tube appears appropriately positioned, terminating in the LEFT renal pelvis. No hydronephrosis. Complex hypodense collection within the posterior cortex of the lower LEFT kidney, measuring 3.6 x 2.8 x 4.3 cm (transverse by AP by craniocaudal dimensions), abscess versus hematoma. RIGHT kidney appears normal. No ureteral calculi appreciated. Bladder is unremarkable. Stomach/Bowel: No dilated large  or small bowel loops. No evidence of bowel wall inflammation seen. Stomach is unremarkable. Vascular/Lymphatic: No significant vascular findings are present. No enlarged abdominal or pelvic lymph nodes. Reproductive: Presumed hysterectomy. Other: No free intraperitoneal air. Musculoskeletal: No acute or suspicious osseous finding. Ill-defined fluid/edema within the subcutaneous soft tissues of the lower abdomen and pelvis indicating anasarca. IMPRESSION: 1. Complex hypodense collection within the posterior cortex of the lower LEFT kidney, measuring 3.6 x 2.8 x 4.3 cm, possible abscess, alternatively liquified hematoma related to the earlier nephrostomy tube placement. There is no air within the collection to confirm renal cortex abscess. 2. LEFT-sided percutaneous nephrostomy tube appears appropriately positioned, terminating in the LEFT renal pelvis. No hydronephrosis. 3. Small patchy consolidations at both lung bases, likely atelectasis. Small bilateral pleural effusions. 4. Anasarca. Electronically Signed   By: Franki Cabot M.D.   On: 11/06/2018 19:43   Dg Chest Port 1 View  Result Date: 11/02/2018 CLINICAL DATA:  New onset fevers EXAM: PORTABLE CHEST 1 VIEW COMPARISON:  10/29/2018 FINDINGS: Cardiac shadow is prominent  accentuated by the portable technique. Increasing density in the left base is noted likely related to underlying atelectasis. No other focal abnormality is seen. IMPRESSION: Increasing parenchymal opacity in the left base consistent with increasing atelectasis. Electronically Signed   By: Inez Catalina M.D.   On: 11/02/2018 12:31   Dg Chest Port 1 View  Result Date: 10/29/2018 CLINICAL DATA:  Tachycardia.  Left flank and abdominal pain. EXAM: PORTABLE CHEST 1 VIEW COMPARISON:  None. FINDINGS: The heart size and mediastinal contours are within normal limits. Opacity in the left base is identified which may represent atelectasis or infiltrate. The visualized skeletal structures are unremarkable. IMPRESSION: Left base opacity which may represent atelectasis or infiltrate Electronically Signed   By: Kerby Moors M.D.   On: 10/29/2018 20:14   Dg Chest Port 1 View  Result Date: 10/29/2018 CLINICAL DATA:  Pt states diagnosed with renal stones Friday, states pain has gotten worse and she has been febrile at home, unsure how high. C/o L flank and abdominal pain. Pt denies vomiting but endorses nausea. Denies blood in urine. Last pai.*comment was truncated*Sepsis EXAM: PORTABLE CHEST 1 VIEW COMPARISON:  None. FINDINGS: Normal cardiac silhouette. Mild central venous congestion. No focal infiltrate. No pneumothorax. IMPRESSION: Mild central venous congestion. Electronically Signed   By: Suzy Bouchard M.D.   On: 10/29/2018 15:41   Ct Renal Stone Study  Result Date: 10/29/2018 CLINICAL DATA:  LEFT flank pain abdominal pain.  Nausea EXAM: CT ABDOMEN AND PELVIS WITHOUT CONTRAST TECHNIQUE: Multidetector CT imaging of the abdomen and pelvis was performed following the standard protocol without IV contrast. COMPARISON:  10/27/2018 FINDINGS: Lower chest: Lung bases clear Hepatobiliary: No focal hepatic lesion.  Gallbladder normal. Pancreas: Pancreas normal Spleen: Normal spleen Adrenals/Urinary Tract: Adrenal glands are  normal. Mild renal edema, hydronephrosis, and hydroureter of the LEFT kidney again noted. The distal LEFT ureteral calculus has migrated approximately 1 cm compared to exam 10/27/2018. Calculus position at the level the LEFT operator space measuring 4 mm by 6 mm (image 77/3). Calculus is approximately 2 cm from the vesicoureteral junction. No nephrolithiasis.  No RIGHT ureterolithiasis.  No bladder calculi Stomach/Bowel: Stomach, small-bowel, appendix and cecum normal. The colon rectosigmoid colon normal. Vascular/Lymphatic: Abdominal aorta is normal caliber. There is no retroperitoneal or periportal lymphadenopathy. No pelvic lymphadenopathy. Reproductive: Post hysterectomy. Cystic enlargement of the RIGHT ovary to 4.2 x 3.8 cm again noted. Cystic component measures approximately 3.1 cm. Other: No free fluid  Musculoskeletal: No aggressive osseous lesion IMPRESSION: 1. Partially obstructing calculus in the distal LEFT ureter has migrated approximately 1 cm towards the vesicoureteral junction. 2. Cystic lesion of the RIGHT ovary. No follow-up necessary in this age group. This recommendation follows ACR consensus guidelines: White Paper of the ACR Incidental Findings Committee II on Adnexal Findings. J Am Coll Radiol [phone]. Electronically Signed   By: Suzy Bouchard M.D.   On: 10/29/2018 15:37   Ct Renal Stone Study  Result Date: 10/27/2018 CLINICAL DATA:  Left flank and lower abdominal pain with nausea and vomiting. EXAM: CT ABDOMEN AND PELVIS WITHOUT CONTRAST TECHNIQUE: Multidetector CT imaging of the abdomen and pelvis was performed following the standard protocol without IV contrast. COMPARISON:  None. FINDINGS: Lower chest: Mild dependent bibasilar atelectasis. No pleural or pericardial effusion. Hepatobiliary: No focal liver abnormality is seen. No gallstones, gallbladder wall thickening, or biliary dilatation. Pancreas: Unremarkable. No pancreatic ductal dilatation or surrounding inflammatory  changes. Spleen: Normal in size without focal abnormality. Adrenals/Urinary Tract: The adrenal glands appear normal. There is mild to moderate left hydronephrosis with stranding about the left kidney and ureter due to a 0.5 cm distal left ureteral stone. No other urinary tract stones are identified. The kidneys otherwise appear normal. Urinary bladder is decompressed but otherwise unremarkable. Stomach/Bowel: Stomach is within normal limits. Appendix appears normal. No evidence of bowel wall thickening, distention, or inflammatory changes. Vascular/Lymphatic: No significant vascular findings are present. No enlarged abdominal or pelvic lymph nodes. Reproductive: Status post hysterectomy. No adnexal masses. Other: Small fat containing umbilical hernia noted. Stranding in subcutaneous fat anterior low pelvis is likely postoperative. Musculoskeletal: No acute or focal abnormality. IMPRESSION: Mild-to-moderate left hydronephrosis due to a 0.5 cm distal left ureteral stone. No other urinary tract stones are seen. Small fat containing umbilical hernia. Electronically Signed   By: Inge Rise M.D.   On: 10/27/2018 09:24   Ir Nephrostomy Placement Left  Result Date: 10/29/2018 CLINICAL DATA:  Obstructing left ureteral calculus, sepsis and need for emergent percutaneous nephrostomy tube placement. EXAM: 1. ULTRASOUND GUIDANCE FOR PUNCTURE OF THE LEFT RENAL COLLECTING SYSTEM. 2. LEFT PERCUTANEOUS NEPHROSTOMY TUBE PLACEMENT. COMPARISON:  CT of the abdomen and pelvis earlier today. ANESTHESIA/SEDATION: 2.0 mg IV Versed; 100 mcg IV Fentanyl. Total Moderate Sedation Time 22 minutes. The patient's level of consciousness and physiologic status were continuously monitored during the procedure by Radiology nursing. CONTRAST:  20 mL Omnipaque 300 MEDICATIONS: No additional medications. A scheduled dose of IV vancomycin was infusing during the procedure. FLUOROSCOPY TIME:  1 minutes and 36 seconds.  12.4 mGy. PROCEDURE: The  procedure, risks, benefits, and alternatives were explained to the patient. Questions regarding the procedure were encouraged and answered. The patient understands and consents to the procedure. A time-out was performed prior to initiating the procedure. The left flank region was prepped with chlorhexidine in a sterile fashion, and a sterile drape was applied covering the operative field. A sterile gown and sterile gloves were used for the procedure. Local anesthesia was provided with 1% Lidocaine. Ultrasound was used to localize the left kidney. Under direct ultrasound guidance, a 21 gauge needle was advanced into the renal collecting system. Ultrasound image documentation was performed. Aspiration of urine sample was performed followed by contrast injection. A transitional dilator was advanced over a guidewire. Percutaneous tract dilatation was then performed over the guidewire. A 10-French percutaneous nephrostomy tube was then advanced and formed in the collecting system. Catheter position was confirmed by fluoroscopy after contrast injection. The catheter was  secured at the skin with a Prolene retention suture and Stat-Lock device. A gravity bag was placed. COMPLICATIONS: None. FINDINGS: Ultrasound demonstrates hydronephrosis. After puncture of the lower pole collecting system, there was return of clear but foul-smelling urine. 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 After nephrostomy tube placement, the tube could not be completely formed in the renal pelvis due to the duplicated nature of the collecting system and small size of the renal pelvis. There was some acute bleeding after nephrostomy tube placement with bloody urine return after tube placement. A urine sample was sent for culture analysis. IMPRESSION: Placement of 10 French left percutaneous nephrostomy tube. The tube was attached to gravity bag drainage. Initial urine output is bloody after tube placement. A urine  sample was sent for culture analysis. Electronically Signed   By: Aletta Edouard M.D.   On: 10/29/2018 19:23   Ir Nephrostomy Exchange Left  Result Date: 11/04/2018 INDICATION: 48 year old female with a history of prior left-sided percutaneous nephrostomy EXAM: IR EXCHANGE NEPHROSTOMY LEFT COMPARISON:  None. MEDICATIONS: None ANESTHESIA/SEDATION: Fentanyl 75 mcg IV; Versed 1.5 mg IV Moderate Sedation Time:  12 minutes The patient was continuously monitored during the procedure by the interventional radiology nurse under my direct supervision. CONTRAST:  40mL OMNIPAQUE IOHEXOL 300 MG/ML SOLN - administered into the collecting system(s) FLUOROSCOPY TIME:  Fluoroscopy Time: 1 minutes 18 seconds (13 mGy). COMPLICATIONS: None PROCEDURE: Informed written consent was obtained from the patient after a thorough discussion of the procedural risks, benefits and alternatives. All questions were addressed. Maximal Sterile Barrier Technique was utilized including caps, mask, sterile gowns, sterile gloves, sterile drape, hand hygiene and skin antiseptic. A timeout was performed prior to the initiation of the procedure. Patient position prone position on the fluoroscopy table the left flank was prepped and draped in the usual sterile fashion. 1% lidocaine was used for local anesthesia. 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 Small amount of contrast confirmed location. Catheter was sutured in position and attached to gravity drainage. Patient tolerated the procedure well and remained hemodynamically stable throughout. No complications were encountered and no significant blood loss. IMPRESSION: Status post routine exchange of left-sided percutaneous nephrostomy. Signed, Dulcy Fanny. Dellia Nims, RPVI Vascular and Interventional Radiology  Specialists [facility] Radiology Electronically Signed   By: Corrie Mckusick D.O.   On: 11/04/2018 08:14   Vas [place] Lower Extremity Venous (dvt)  Result Date: 10/31/2018  Lower Venous Study Indications: Elevated d-dimer.  Limitations: Body habitus. Comparison Study: No prior study. Performing Technologist: Maudry Mayhew MHA, RDMS, RVT, RDCS  Examination Guidelines: A complete evaluation includes B-mode imaging, spectral Doppler, color Doppler, and power Doppler as needed of all accessible portions of each vessel. Bilateral testing is considered an integral part of a complete examination. Limited examinations for reoccurring indications may be performed as noted.  +---------+---------------+---------+-----------+----------+--------------+  RIGHT     Compressibility Phasicity Spontaneity Properties Thrombus Aging  +---------+---------------+---------+-----------+----------+--------------+  CFV       Full            Yes       Yes                                    +---------+---------------+---------+-----------+----------+--------------+  SFJ  Full                                                             +---------+---------------+---------+-----------+----------+--------------+  FV Prox   Full                                                             +---------+---------------+---------+-----------+----------+--------------+  FV Mid    Full                                                             +---------+---------------+---------+-----------+----------+--------------+  FV Distal Full                                                             +---------+---------------+---------+-----------+----------+--------------+  PFV       Full                                                             +---------+---------------+---------+-----------+----------+--------------+  POP       Full            Yes       Yes                                     +---------+---------------+---------+-----------+----------+--------------+  PTV       Full                                                             +---------+---------------+---------+-----------+----------+--------------+  PERO      Full                                                             +---------+---------------+---------+-----------+----------+--------------+   +---------+---------------+---------+-----------+----------+--------------+  LEFT      Compressibility Phasicity Spontaneity Properties Thrombus Aging  +---------+---------------+---------+-----------+----------+--------------+  CFV       Full            Yes       Yes                                    +---------+---------------+---------+-----------+----------+--------------+  SFJ       Full                                                             +---------+---------------+---------+-----------+----------+--------------+  FV Prox   Full                                                             +---------+---------------+---------+-----------+----------+--------------+  FV Mid    Full                                                             +---------+---------------+---------+-----------+----------+--------------+  FV Distal Full                                                             +---------+---------------+---------+-----------+----------+--------------+  PFV       Full                                                             +---------+---------------+---------+-----------+----------+--------------+  POP       Full            Yes       Yes                                    +---------+---------------+---------+-----------+----------+--------------+  PTV       Full                                                             +---------+---------------+---------+-----------+----------+--------------+  PERO      Full                                                              +---------+---------------+---------+-----------+----------+--------------+  Summary: Right: There is no evidence of deep vein thrombosis in the lower extremity. No cystic structure found in the popliteal fossa. Left: There is no evidence of deep vein thrombosis in the lower extremity. No cystic structure found in the popliteal fossa.  *See table(s) above for measurements and observations. Electronically signed by Deitra Mayo  MD on 10/31/2018 at 4:34:24 PM.    Final     Labs:  CBC: Recent Labs    11/04/18 0745 11/05/18 0710 11/06/18 0957 11/07/18 0850  WBC 13.0* 11.8* 10.1 10.1  HGB 10.5* 9.7* 9.7* 10.4*  HCT 32.0* 28.2* 29.8* 30.6*  PLT 266 361 553* 641*    COAGS: Recent Labs    10/29/18 1443 11/07/18 0850  INR 1.4* 1.2  APTT 29  --     BMP: Recent Labs    11/03/18 0614 11/04/18 0745 11/06/18 0957 11/07/18 0850  NA 136 138 141 139  K 3.3* 3.5 3.2* 3.4*  CL 108 108 109 104  CO2 20* 21* 23 24  GLUCOSE 94 106* 111* 110*  BUN 5* 5* <5* <5*  CALCIUM 7.7* 7.9* 7.9* 8.2*  CREATININE 0.99 0.95 0.85 0.87  GFRNONAA >60 >60 >60 >60  GFRAA >60 >60 >60 >60    LIVER FUNCTION TESTS: Recent Labs    05/24/18 1413 10/27/18 0750 10/29/18 1443 10/30/18 0017  11/02/18 0412 11/03/18 0614 11/04/18 0745 11/06/18 0957  BILITOT 0.3 0.8 1.2 1.8*  --   --   --   --   --   AST 18 18 25 26   --   --   --   --   --   ALT 13 15 20 18   --   --   --   --   --   ALKPHOS 63 49 71 117  --   --   --   --   --   PROT 7.9 7.2 6.1* 5.2*  --   --   --   --   --   ALBUMIN 3.8 3.8 2.8* 2.1*   < > 1.7* 1.7* 1.9* 2.1*   < > = values in this interval not displayed.    TUMOR MARKERS: No results for input(s): AFPTM, CEA, CA199, CHROMGRNA in the last 8760 hours.  Assessment and Plan:  Left renal abscess For drain placement I n IR Risks and benefits discussed with the patient including bleeding, infection, damage to adjacent structures, bowel perforation/fistula connection, and  sepsis.  All of the patient's questions were answered, patient is agreeable to proceed. Consent signed and in chart.   Thank you for this interesting consult.  I greatly enjoyed meeting Glorimar D Watson and look forward to participating in their care.  A copy of this report was sent to the requesting provider on this date.  Electronically Signed: Lavonia Drafts, PA-C 11/07/2018, 10:00 AM   I spent a total of 20 Minutes    in face to face in clinical consultation, greater than 50% of which was counseling/coordinating care for left renal abscess drain placement

## 2018-11-07 NOTE — Progress Notes (Signed)
Pt arrived back on unit from IR. New JP drain is in place and secured along with L nephrostomy tube. VS are stable. Will continue to monitor.

## 2018-11-08 DIAGNOSIS — N200 Calculus of kidney: Secondary | ICD-10-CM

## 2018-11-08 LAB — CBC
HCT: 31.1 % — ABNORMAL LOW (ref 36.0–46.0)
Hemoglobin: 10.3 g/dL — ABNORMAL LOW (ref 12.0–15.0)
MCH: 29.6 pg (ref 26.0–34.0)
MCHC: 33.1 g/dL (ref 30.0–36.0)
MCV: 89.4 fL (ref 80.0–100.0)
Platelets: 689 10*3/uL — ABNORMAL HIGH (ref 150–400)
RBC: 3.48 MIL/uL — ABNORMAL LOW (ref 3.87–5.11)
RDW: 13.9 % (ref 11.5–15.5)
WBC: 8.1 10*3/uL (ref 4.0–10.5)
nRBC: 0.2 % (ref 0.0–0.2)

## 2018-11-08 LAB — RENAL FUNCTION PANEL
Albumin: 2.2 g/dL — ABNORMAL LOW (ref 3.5–5.0)
Anion gap: 9 (ref 5–15)
BUN: <5 mg/dL — ABNORMAL LOW (ref 6–20)
CO2: 25 mmol/L (ref 22–32)
Calcium: 8.4 mg/dL — ABNORMAL LOW (ref 8.9–10.3)
Chloride: 106 mmol/L (ref 98–111)
Creatinine, Ser: 0.92 mg/dL (ref 0.44–1.00)
GFR calc Af Amer: >60 mL/min (ref >60)
GFR calc non Af Amer: >60 mL/min (ref >60)
Glucose, Bld: 96 mg/dL (ref 70–99)
Phosphorus: 3.9 mg/dL (ref 2.5–4.6)
Potassium: 3.5 mmol/L (ref 3.5–5.1)
Sodium: 140 mmol/L (ref 135–145)

## 2018-11-08 LAB — MAGNESIUM: Magnesium: 2 mg/dL (ref 1.7–2.4)

## 2018-11-08 NOTE — Progress Notes (Signed)
Referring Physician(s): Dr. Alinda Money  Supervising Physician: Arne Cleveland  Patient Status:  [facility] - In-pt  Chief Complaint: Urosepsis  Subjective: Pain improved after second drain placement although still with generalized flank pain.  Able to tolerate flushes now.  No fever since yesterday. WBC 8.1  Allergies: Patient has no known allergies.  Medications: Prior to Admission medications   Medication Sig Start Date End Date Taking? Authorizing Provider  acetaminophen (TYLENOL) 500 MG tablet Take 1,000 mg by mouth every 6 (six) hours as needed for headache.   Yes [provider]  dexlansoprazole (DEXILANT) 60 MG capsule TAKE 1 CAPSULE BY MOUTH DAILY BEFORE BREAKFAST. Patient taking differently: Take 60 mg by mouth daily before breakfast.  08/01/18  Yes Nandigam, Venia Minks, MD  fluticasone (FLONASE) 50 MCG/ACT nasal spray Place 2 sprays into both nostrils daily. Patient taking differently: Place 2 sprays into both nostrils daily as needed for allergies.  11/08/16  Yes Rogue Bussing, MD  HYDROcodone-acetaminophen (NORCO/VICODIN) 5-325 MG tablet Take 1 tablet by mouth every 6 (six) hours as needed for up to 3 days for severe pain. 10/27/18 10/30/18 Yes Madilyn Hook A, PA-C  ibuprofen (ADVIL) 200 MG tablet Take 600 mg by mouth every 6 (six) hours as needed for headache (pain).   Yes [provider]  ondansetron (ZOFRAN) 4 MG tablet Take 1 tablet (4 mg total) by mouth every 6 (six) hours. 10/27/18  Yes Alveria Apley, PA-C  oxyCODONE-acetaminophen (PERCOCET/ROXICET) 5-325 MG tablet Take 1-2 tablets by mouth every 6 (six) hours as needed for moderate pain (once the patient tolerates po and PCA has been discontinued). Patient taking differently: Take 1-2 tablets by mouth every 6 (six) hours as needed for moderate pain.  06/16/18  Yes Waymon Amato, MD  senna-docusate (SENOKOT-S) 8.6-50 MG tablet Take 2 tablets by mouth at bedtime as needed for mild constipation or  moderate constipation. 06/16/18  Yes Waymon Amato, MD  tamsulosin (FLOMAX) 0.4 MG CAPS capsule Take 1 capsule (0.4 mg total) by mouth daily. 10/27/18 11/26/18 Yes Madilyn Hook A, PA-C  vitamin C (ASCORBIC ACID) 500 MG tablet Take 500 mg by mouth daily as needed (immune system boost/ cold symptoms).    Yes [provider]  benzonatate (TESSALON) 100 MG capsule Take 1 capsule (100 mg total) by mouth 3 (three) times daily as needed for cough. Patient not taking: Reported on 10/29/2018 01/09/18   Markham Bing, DO  docusate sodium (COLACE) 100 MG capsule Take 1 capsule (100 mg total) by mouth 2 (two) times daily. Patient not taking: Reported on 10/29/2018 06/16/18   Waymon Amato, MD  ibuprofen (ADVIL) 600 MG tablet Take 1 tablet (600 mg total) by mouth every 6 (six) hours. Patient not taking: Reported on 10/29/2018 06/19/18   Waymon Amato, MD  lidocaine (LIDODERM) 5 % Place 1 patch onto the skin daily. Remove & Discard patch within 12 hours or as directed by MD Patient not taking: Reported on 10/29/2018 06/16/18   Waymon Amato, MD  LINZESS 72 MCG capsule TAKE 1 CAPSULE (72 MCG TOTAL) BY MOUTH DAILY BEFORE BREAKFAST. Patient not taking: No sig reported 11/03/17   Mauri Pole, MD  pantoprazole (PROTONIX) 40 MG tablet Take 1 tablet (40 mg total) by mouth daily. Patient not taking: Reported on 10/29/2018 06/17/18   Waymon Amato, MD     Vital Signs: BP 137/75 (BP Location: Right Arm)    Pulse 86    Temp 98.6 F (37 C) (Oral)  Resp 17    Ht 5' 5.5" (1.664 m)    Wt 218 lb 11.1 oz (99.2 kg)    LMP 06/05/2018    SpO2 100%    BMI 35.84 kg/m   Physical Exam  NAD, alert Abdomen:  Generalized tenderness to left flank slightly improved. L PCN in place.  Insertion site c/d/i. Yellow urine output.  Inferior abscess drain in place.  Insertion site c/d/i. Tube reinserted into stat-lock.  Dark red/brown output.  Imaging: [place] Abscess Drain  Result Date: 11/07/2018 INDICATION: 48 year old female with a history  of renal abscess EXAM: ULTRASOUND GUIDED ABSCESS DRAINAGE MEDICATIONS: The patient is currently admitted to the hospital and receiving intravenous antibiotics. The antibiotics were administered within an appropriate time frame prior to the initiation of the procedure. ANESTHESIA/SEDATION: Fentanyl 100 mcg IV; Versed 2.0 mg IV Moderate Sedation Time:  29 minutes The patient was continuously monitored during the procedure by the interventional radiology nurse under my direct supervision. COMPLICATIONS: None PROCEDURE: Informed written consent was obtained from the patient after a thorough discussion of the procedural risks, benefits and alternatives. All questions were addressed. Maximal Sterile Barrier Technique was utilized including caps, mask, sterile gowns, sterile gloves, sterile drape, hand hygiene and skin antiseptic. A timeout was performed prior to the initiation of the procedure. Patient position prone position on the ultrasound stretcher. Images of the left flank were performed with images stored and sent to PACs. The patient is prepped and draped in the usual sterile fashion. 1% lidocaine was used for local anesthesia. Using ultrasound guidance, 18 gauge trocar needle was advanced into the abscess of the lower pole left kidney. Once we confirmed needle position the stylet was removed aspiration of purulent material confirmed the position. Modified Seldinger technique was then used to place a 10 [place] drain into the inferior pole. Approximately 6 cc of purulent material aspirated for culture. Catheter was sutured in position and attached to bulb suction. Patient tolerated the procedure well and remained hemodynamically stable throughout. No complications were encountered and no significant blood loss. IMPRESSION: Status post ultrasound-guided drainage of lower pole abscess of the left kidney. Signed, Dulcy Fanny. Dellia Nims, RPVI Vascular and Interventional Radiology Specialists [facility] Radiology  Electronically Signed   By: Corrie Mckusick D.O.   On: 11/07/2018 14:28   Ct Abdomen Pelvis W Contrast  Result Date: 11/06/2018 CLINICAL DATA:  Abdominal pain, fever, abscess suspected. History of LEFT percutaneous nephrostomy tube insertion on September 27th. Tube exchanged on October 2nd. EXAM: CT ABDOMEN AND PELVIS WITH CONTRAST TECHNIQUE: Multidetector CT imaging of the abdomen and pelvis was performed using the standard protocol following bolus administration of intravenous contrast. CONTRAST:  19mL OMNIPAQUE IOHEXOL 300 MG/ML  SOLN COMPARISON:  CT abdomen dated 11/02/2018. FINDINGS: Lower chest: Small patchy consolidations at both lung bases, likely atelectasis. Small bilateral pleural effusions. Hepatobiliary: No focal liver abnormality is seen. No gallstones, gallbladder wall thickening, or biliary dilatation. Pancreas: Unremarkable. No pancreatic ductal dilatation or surrounding inflammatory changes. Spleen: Normal in size without focal abnormality. Adrenals/Urinary Tract: LEFT-sided percutaneous nephrostomy tube in place. Tube appears appropriately positioned, terminating in the LEFT renal pelvis. No hydronephrosis. Complex hypodense collection within the posterior cortex of the lower LEFT kidney, measuring 3.6 x 2.8 x 4.3 cm (transverse by AP by craniocaudal dimensions), abscess versus hematoma. RIGHT kidney appears normal. No ureteral calculi appreciated. Bladder is unremarkable. Stomach/Bowel: No dilated large or small bowel loops. No evidence of bowel wall inflammation seen. Stomach is unremarkable. Vascular/Lymphatic: No significant vascular findings are  present. No enlarged abdominal or pelvic lymph nodes. Reproductive: Presumed hysterectomy. Other: No free intraperitoneal air. Musculoskeletal: No acute or suspicious osseous finding. Ill-defined fluid/edema within the subcutaneous soft tissues of the lower abdomen and pelvis indicating anasarca. IMPRESSION: 1. Complex hypodense collection within  the posterior cortex of the lower LEFT kidney, measuring 3.6 x 2.8 x 4.3 cm, possible abscess, alternatively liquified hematoma related to the earlier nephrostomy tube placement. There is no air within the collection to confirm renal cortex abscess. 2. LEFT-sided percutaneous nephrostomy tube appears appropriately positioned, terminating in the LEFT renal pelvis. No hydronephrosis. 3. Small patchy consolidations at both lung bases, likely atelectasis. Small bilateral pleural effusions. 4. Anasarca. Electronically Signed   By: Franki Cabot M.D.   On: 11/06/2018 19:43    Labs:  CBC: Recent Labs    11/05/18 0710 11/06/18 0957 11/07/18 0850 11/08/18 0331  WBC 11.8* 10.1 10.1 8.1  HGB 9.7* 9.7* 10.4* 10.3*  HCT 28.2* 29.8* 30.6* 31.1*  PLT 361 553* 641* 689*    COAGS: Recent Labs    10/29/18 1443 11/07/18 0850  INR 1.4* 1.2  APTT 29  --     BMP: Recent Labs    11/04/18 0745 11/06/18 0957 11/07/18 0850 11/08/18 0331  NA 138 141 139 140  K 3.5 3.2* 3.4* 3.5  CL 108 109 104 106  CO2 21* 23 24 25   GLUCOSE 106* 111* 110* 96  BUN 5* <5* <5* <5*  CALCIUM 7.9* 7.9* 8.2* 8.4*  CREATININE 0.95 0.85 0.87 0.92  GFRNONAA >60 >60 >60 >60  GFRAA >60 >60 >60 >60    LIVER FUNCTION TESTS: Recent Labs    05/24/18 1413 10/27/18 0750 10/29/18 1443 10/30/18 0017  11/03/18 0614 11/04/18 0745 11/06/18 0957 11/08/18 0331  BILITOT 0.3 0.8 1.2 1.8*  --   --   --   --   --   AST 18 18 25 26   --   --   --   --   --   ALT 13 15 20 18   --   --   --   --   --   ALKPHOS 63 49 71 117  --   --   --   --   --   PROT 7.9 7.2 6.1* 5.2*  --   --   --   --   --   ALBUMIN 3.8 3.8 2.8* 2.1*   < > 1.7* 1.9* 2.1* 2.2*   < > = values in this interval not displayed.    Assessment and Plan: Urosepsis in the setting of nephrolithiasis s/p left PCN placement 9/27 by Dr. Kathlene Cote, exchanged 10/2 by Dr. Earleen Newport Inferior pole abscess s/p drain placement 10/6 by Dr. Earleen Newport.   Yellow urine in collection  bag today.  Dark brown fluid in abscess bulb.   No fevers thus far today.  WBC improved.  Orders in place for both drains to be flushed.  Patient states she is tolerating PCN flushes now.   Continue current management.   Electronically Signed: Docia Barrier, PA 11/08/2018, 3:35 PM   I spent a total of 15 Minutes at the the patient's bedside AND on the patient's hospital floor or unit, greater than 50% of which was counseling/coordinating care for urosepsis.

## 2018-11-08 NOTE — Progress Notes (Signed)
PROGRESS NOTE    Tamara Watson  [id number] DOB: 07/09/1970 DOA: 10/29/2018 PCP: Haywood Pao, MD  Brief Narrative:  48 y.o. female that presented secondary to sepsis in setting of pylonephritis and bacteremia from klebsiella pneumoniae infection. On antibiotics. S/p perc drain. Repeat abdominal CT suggesting another abscess.  11/08/2018 patient reports some nausea but has not had any vomiting.  Labs from 10 7 noted with a sodium of 140 potassium 3.5 creatinine 0.92.  Mag is 2.0.  8.1 hemoglobin 10.3. Assessment & Plan:   Active Problems:   Sepsis ([place])   Pyelonephritis   Left ureteral stone   Tachycardia   Fever   Renal abscess, left   Nausea   #1 sepsis present on admission secondary to pyelonephritis and Klebsiella bacteremia on ceftriaxone.  Repeat blood cultures 10 1 no growth.  Has been afebrile last 24 hours blood pressure stable.  #2 pyelonephritis with renal abscess in the setting of nephrolithiasis status post left percutaneous nephrostomy tube insertion 10/29/2018 by IR.  Since patient had persistent fever repeat CT of the abdomen showed possible new abscess is now status post JP drain draining blood-tinged drainage.  He is status post ultrasound-guided left lower pole renal abscess drainage.  Follow-up culture.  IR following.  #3 nausea/vomiting still needing Phenergan Zofran and hydroxyzine.  Will attempt to start her on a diet and advance as tolerated.  #4  #4    Estimated body mass index is 35.84 kg/m as calculated from the following:   Height as of this encounter: 5' 5.5" (1.664 m).   Weight as of this encounter: 99.2 kg.  DVT prophylaxis: SCD  code Status: Full code Family Communication: Nobody at bedside patient lives at home with family Disposition Plan: Pending final cultures Consultants:   Interventional radiology neurology  Procedures: 9/27 left percutaneous nephrostomy tube insertion  11/03/18 left percutaneous nephrostomy tube  exchange 11/07/2018 placement of left JP drain  antimicrobials: Ceftriaxone Status post Vanco cefepime and Ancef Subjective: Sitting up in bed complains of nausea denied any vomiting today has not had anything to eat denies any diarrhea fever chills is complaining of incisional site pain from the JP drain that was placed yesterday.  Objective: Vitals:   11/07/18 2319 11/08/18 0328 11/08/18 0500 11/08/18 1228  BP: 133/66 140/81  137/75  Pulse: 100 83  86  Resp:    17  Temp: 99 F (37.2 C) 98.4 F (36.9 C)  98.6 F (37 C)  TempSrc: Oral Oral  Oral  SpO2: 95% 100%  100%  Weight:   99.2 kg   Height:        Intake/Output Summary (Last 24 hours) at 11/08/2018 1304 Last data filed at 11/08/2018 1000 Gross per 24 hour  Intake 15 ml  Output 5000 ml  Net -4985 ml   Filed Weights   11/06/18 0445 11/07/18 0500 11/08/18 0500  Weight: 97.6 kg 99.2 kg 99.2 kg    Examination:  General exam: Appears calm and comfortable  Respiratory system: Clear to auscultation. Respiratory effort normal. Cardiovascular system: S1 & S2 heard, RRR. No JVD, murmurs, rubs, gallops or clicks. No pedal edema. Gastrointestinal system: Abdomen is nondistended, soft and nontender. No organomegaly or masses felt. Normal bowel sounds heard.  2 drains from the left kidney noted with serosanguineous drainage Central nervous system: Alert and oriented. No focal neurological deficits. Extremities 1+ pitting edema Skin: No rashes, lesions or ulcers Psychiatry: Judgement and insight appear normal. Mood & affect appropriate.     Data  Reviewed: I have personally reviewed following labs and imaging studies  CBC: Recent Labs  Lab 11/03/18 0614 11/04/18 0745 11/05/18 0710 11/06/18 0957 11/07/18 0850 11/08/18 0331  WBC 13.2* 13.0* 11.8* 10.1 10.1 8.1  NEUTROABS 10.0* 10.4* 9.3*  --   --   --   HGB 10.1* 10.5* 9.7* 9.7* 10.4* 10.3*  HCT 29.6* 32.0* 28.2* 29.8* 30.6* 31.1*  MCV 87.6 89.1 87.9 89.2 88.2 89.4    PLT 193 266 361 553* 641* [id number]*   Basic Metabolic Panel: Recent Labs  Lab 11/02/18 0412 11/03/18 0614 11/04/18 0745 11/06/18 0957 11/07/18 0850 11/08/18 0331  NA 137 136 138 141 139 140  K 3.3* 3.3* 3.5 3.2* 3.4* 3.5  CL 109 108 108 109 104 106  CO2 21* 20* 21* 23 24 25   GLUCOSE 110* 94 106* 111* 110* 96  BUN 9 5* 5* <5* <5* <5*  CREATININE 1.22* 0.99 0.95 0.85 0.87 0.92  CALCIUM 7.4* 7.7* 7.9* 7.9* 8.2* 8.4*  MG  --   --   --  1.9 2.0 2.0  PHOS 2.4* 2.8 3.0 2.6  --  3.9   GFR: Estimated Creatinine Clearance: 90.9 mL/min (by C-G formula based on SCr of 0.92 mg/dL). Liver Function Tests: Recent Labs  Lab 11/02/18 0412 11/03/18 [id number] 11/04/18 0745 11/06/18 0957 11/08/18 0331  ALBUMIN 1.7* 1.7* 1.9* 2.1* 2.2*   No results for input(s): LIPASE, AMYLASE in the last 168 hours. No results for input(s): AMMONIA in the last 168 hours. Coagulation Profile: Recent Labs  Lab 11/07/18 0850  INR 1.2   Cardiac Enzymes: No results for input(s): CKTOTAL, CKMB, CKMBINDEX, TROPONINI in the last 168 hours. BNP (last 3 results) No results for input(s): PROBNP in the last 8760 hours. HbA1C: No results for input(s): HGBA1C in the last 72 hours. CBG: No results for input(s): GLUCAP in the last 168 hours. Lipid Profile: No results for input(s): CHOL, HDL, LDLCALC, TRIG, CHOLHDL, LDLDIRECT in the last 72 hours. Thyroid Function Tests: No results for input(s): TSH, T4TOTAL, FREET4, T3FREE, THYROIDAB in the last 72 hours. Anemia Panel: No results for input(s): VITAMINB12, FOLATE, FERRITIN, TIBC, IRON, RETICCTPCT in the last 72 hours. Sepsis Labs: No results for input(s): PROCALCITON, LATICACIDVEN in the last 168 hours.  Recent Results (from the past 240 hour(s))  SARS Coronavirus 2 [facility] order, Performed in [facility] hospital lab) Nasopharyngeal Nasopharyngeal Swab     Status: None   Collection Time: 10/29/18  2:09 PM   Specimen: Nasopharyngeal Swab  Result Value Ref Range  Status   SARS Coronavirus 2 NEGATIVE NEGATIVE Final    Comment: (NOTE) If result is NEGATIVE SARS-CoV-2 target nucleic acids are NOT DETECTED. The SARS-CoV-2 RNA is generally detectable in upper and lower  respiratory specimens during the acute phase of infection. The lowest  concentration of SARS-CoV-2 viral copies this assay can detect is 250  copies / mL. A negative result does not preclude SARS-CoV-2 infection  and should not be used as the sole basis for treatment or other  patient management decisions.  A negative result may occur with  improper specimen collection / handling, submission of specimen other  than nasopharyngeal swab, presence of viral mutation(s) within the  areas targeted by this assay, and inadequate number of viral copies  (<250 copies / mL). A negative result must be combined with clinical  observations, patient history, and epidemiological information. If result is POSITIVE SARS-CoV-2 target nucleic acids are DETECTED. The SARS-CoV-2 RNA is generally detectable in upper and lower  respiratory specimens dur ing the acute phase of infection.  Positive  results are indicative of active infection with SARS-CoV-2.  Clinical  correlation with patient history and other diagnostic information is  necessary to determine patient infection status.  Positive results do  not rule out bacterial infection or co-infection with other viruses. If result is PRESUMPTIVE POSTIVE SARS-CoV-2 nucleic acids MAY BE PRESENT.   A presumptive positive result was obtained on the submitted specimen  and confirmed on repeat testing.  While 2019 novel coronavirus  (SARS-CoV-2) nucleic acids may be present in the submitted sample  additional confirmatory testing may be necessary for epidemiological  and / or clinical management purposes  to differentiate between  SARS-CoV-2 and other Sarbecovirus currently known to infect humans.  If clinically indicated additional testing with an alternate  test  methodology [id number]) is advised. The SARS-CoV-2 RNA is generally  detectable in upper and lower respiratory sp ecimens during the acute  phase of infection. The expected result is Negative. Fact Sheet for Patients:  StrictlyIdeas.no Fact Sheet for Healthcare Providers: BankingDealers.co.za This test is not yet approved or cleared by the [place] FDA and has been authorized for detection and/or diagnosis of SARS-CoV-2 by FDA under an Emergency Use Authorization (EUA).  This EUA will remain in effect (meaning this test can be used) for the duration of the COVID-19 declaration under Section 564(b)(1) of the Act, 21 U.S.C. section 360bbb-3(b)(1), unless the authorization is terminated or revoked sooner. Performed at [facility], [place] [address], [place], [place] [address]   Urine culture     Status: Abnormal   Collection Time: 10/29/18  2:21 PM   Specimen: In/Out Cath Urine  Result Value Ref Range Status   Specimen Description IN/OUT CATH URINE  Final   Special Requests   Final    NONE Performed at [facility], [place] [address] [place], [place] [address]    Culture 50,000 COLONIES/mL KLEBSIELLA PNEUMONIAE (A)  Final   Report Status 10/31/2018 FINAL  Final   Organism ID, Bacteria KLEBSIELLA PNEUMONIAE (A)  Final      Susceptibility   Klebsiella pneumoniae - MIC*    AMPICILLIN >=32 RESISTANT Resistant     CEFAZOLIN <=4 SENSITIVE Sensitive     CEFTRIAXONE <=1 SENSITIVE Sensitive     CIPROFLOXACIN <=0.25 SENSITIVE Sensitive     GENTAMICIN <=1 SENSITIVE Sensitive     IMIPENEM 1 SENSITIVE Sensitive     NITROFURANTOIN 128 RESISTANT Resistant     TRIMETH/SULFA <=20 SENSITIVE Sensitive     AMPICILLIN/SULBACTAM 4 SENSITIVE Sensitive     PIP/TAZO <=4 SENSITIVE Sensitive     Extended ESBL NEGATIVE Sensitive     * 50,000 COLONIES/mL KLEBSIELLA PNEUMONIAE  Blood Culture (routine x 2)     Status: Abnormal   Collection  Time: 10/29/18  2:26 PM   Specimen: BLOOD  Result Value Ref Range Status   Specimen Description BLOOD RIGHT ANTECUBITAL  Final   Special Requests   Final    BOTTLES DRAWN AEROBIC AND ANAEROBIC Blood Culture adequate volume   Culture  Setup Time   Final    GRAM NEGATIVE RODS IN BOTH AEROBIC AND ANAEROBIC BOTTLES CRITICAL RESULT CALLED TO, READ BACK BY AND VERIFIED WITH: T. EGAN,PHARMD 0423 10/30/2018 T. TYSOR    Culture (A)  Final    KLEBSIELLA PNEUMONIAE SUSCEPTIBILITIES PERFORMED ON PREVIOUS CULTURE WITHIN THE LAST 5 DAYS. Performed at [facility], [place] [address] [place], [place] [address]    Report  Status 11/01/2018 FINAL  Final  Blood Culture (routine x 2)     Status: Abnormal   Collection Time: 10/29/18  3:23 PM   Specimen: BLOOD  Result Value Ref Range Status   Specimen Description BLOOD LEFT ANTECUBITAL  Final   Special Requests   Final    BOTTLES DRAWN AEROBIC AND ANAEROBIC Blood Culture results may not be optimal due to an inadequate volume of blood received in culture bottles   Culture  Setup Time   Final    GRAM NEGATIVE RODS IN BOTH AEROBIC AND ANAEROBIC BOTTLES CRITICAL RESULT CALLED TO, READ BACK BY AND VERIFIED WITH: TDonzetta Kohut [id number] 10/30/2018 Mena Goes Performed at [facility], [place] [address], [place], [place] [address]    Culture KLEBSIELLA PNEUMONIAE (A)  Final   Report Status 11/01/2018 FINAL  Final   Organism ID, Bacteria KLEBSIELLA PNEUMONIAE  Final      Susceptibility   Klebsiella pneumoniae - MIC*    AMPICILLIN >=32 RESISTANT Resistant     CEFAZOLIN <=4 SENSITIVE Sensitive     CEFEPIME <=1 SENSITIVE Sensitive     CEFTAZIDIME <=1 SENSITIVE Sensitive     CEFTRIAXONE <=1 SENSITIVE Sensitive     CIPROFLOXACIN <=0.25 SENSITIVE Sensitive     GENTAMICIN <=1 SENSITIVE Sensitive     IMIPENEM 0.5 SENSITIVE Sensitive     TRIMETH/SULFA <=20 SENSITIVE Sensitive     AMPICILLIN/SULBACTAM 4 SENSITIVE Sensitive     PIP/TAZO <=4 SENSITIVE Sensitive       Extended ESBL NEGATIVE Sensitive     * KLEBSIELLA PNEUMONIAE  Blood Culture ID Panel (Reflexed)     Status: Abnormal   Collection Time: 10/29/18  3:23 PM  Result Value Ref Range Status   Enterococcus species NOT DETECTED NOT DETECTED Final   Listeria monocytogenes NOT DETECTED NOT DETECTED Final   Staphylococcus species NOT DETECTED NOT DETECTED Final   Staphylococcus aureus (BCID) NOT DETECTED NOT DETECTED Final   Streptococcus species NOT DETECTED NOT DETECTED Final   Streptococcus agalactiae NOT DETECTED NOT DETECTED Final   Streptococcus pneumoniae NOT DETECTED NOT DETECTED Final   Streptococcus pyogenes NOT DETECTED NOT DETECTED Final   Acinetobacter baumannii NOT DETECTED NOT DETECTED Final   Enterobacteriaceae species DETECTED (A) NOT DETECTED Final    Comment: Enterobacteriaceae represent a large family of gram-negative bacteria, not a single organism. CRITICAL RESULT CALLED TO, READ BACK BY AND VERIFIED WITH: T. EGAN,PHARMD 0423 10/30/2018 T. TYSOR    Enterobacter cloacae complex NOT DETECTED NOT DETECTED Final   Escherichia coli NOT DETECTED NOT DETECTED Final   Klebsiella oxytoca NOT DETECTED NOT DETECTED Final   Klebsiella pneumoniae DETECTED (A) NOT DETECTED Final    Comment: CRITICAL RESULT CALLED TO, READ BACK BY AND VERIFIED WITH: T. EGAN,PHARMD 0423 10/30/2018 T. TYSOR    Proteus species NOT DETECTED NOT DETECTED Final   Serratia marcescens NOT DETECTED NOT DETECTED Final   Carbapenem resistance NOT DETECTED NOT DETECTED Final   Haemophilus influenzae NOT DETECTED NOT DETECTED Final   Neisseria meningitidis NOT DETECTED NOT DETECTED Final   Pseudomonas aeruginosa NOT DETECTED NOT DETECTED Final   Candida albicans NOT DETECTED NOT DETECTED Final   Candida glabrata NOT DETECTED NOT DETECTED Final   Candida krusei NOT DETECTED NOT DETECTED Final   Candida parapsilosis NOT DETECTED NOT DETECTED Final   Candida tropicalis NOT DETECTED NOT DETECTED Final     Comment: Performed at [facility], [place] [address], [place], [place] [address]  Urine Culture  Status: Abnormal   Collection Time: 10/29/18  7:09 PM   Specimen: Kidney, Left; Urine  Result Value Ref Range Status   Specimen Description KIDNEY LEFT URINE  Final   Special Requests   Final    A Performed at [facility], [address], [place], [place] [address]    Culture >=100,000 COLONIES/mL KLEBSIELLA PNEUMONIAE (A)  Final   Report Status 10/31/2018 FINAL  Final   Organism ID, Bacteria KLEBSIELLA PNEUMONIAE (A)  Final      Susceptibility   Klebsiella pneumoniae - MIC*    AMPICILLIN >=32 RESISTANT Resistant     CEFAZOLIN <=4 SENSITIVE Sensitive     CEFEPIME <=1 SENSITIVE Sensitive     CEFTAZIDIME <=1 SENSITIVE Sensitive     CEFTRIAXONE <=1 SENSITIVE Sensitive     CIPROFLOXACIN <=0.25 SENSITIVE Sensitive     GENTAMICIN <=1 SENSITIVE Sensitive     IMIPENEM 0.5 SENSITIVE Sensitive     TRIMETH/SULFA <=20 SENSITIVE Sensitive     AMPICILLIN/SULBACTAM 4 SENSITIVE Sensitive     PIP/TAZO <=4 SENSITIVE Sensitive     Extended ESBL NEGATIVE Sensitive     * >=100,000 COLONIES/mL KLEBSIELLA PNEUMONIAE  MRSA PCR Screening     Status: None   Collection Time: 10/30/18  8:30 AM   Specimen: Nasopharyngeal  Result Value Ref Range Status   MRSA by PCR NEGATIVE NEGATIVE Final    Comment:        The GeneXpert MRSA Assay (FDA approved for NASAL specimens only), is one component of a comprehensive MRSA colonization surveillance program. It is not intended to diagnose MRSA infection nor to guide or monitor treatment for MRSA infections. Performed at [facility], [place] [address] [place], [place] [address]   Culture, blood (routine x 2)     Status: None   Collection Time: 11/02/18 12:41 PM   Specimen: BLOOD  Result Value Ref Range Status   Specimen Description BLOOD RIGHT ANTECUBITAL  Final   Special Requests   Final    BOTTLES DRAWN AEROBIC AND ANAEROBIC Blood Culture  adequate volume   Culture   Final    NO GROWTH 5 DAYS Performed at [facility], [place] [address] [place], [place] [address]    Report Status 11/07/2018 FINAL  Final  Culture, blood (routine x 2)     Status: None   Collection Time: 11/02/18 12:41 PM   Specimen: BLOOD  Result Value Ref Range Status   Specimen Description BLOOD LEFT ANTECUBITAL  Final   Special Requests   Final    BOTTLES DRAWN AEROBIC AND ANAEROBIC Blood Culture adequate volume   Culture   Final    NO GROWTH 5 DAYS Performed at [facility], [place] [address] [place], [place] [address]    Report Status 11/07/2018 FINAL  Final  Culture, Urine     Status: None   Collection Time: 11/03/18 12:10 AM   Specimen: Urine, Random  Result Value Ref Range Status   Specimen Description URINE, RANDOM  Final   Special Requests NONE  Final   Culture   Final    NO GROWTH Performed at [facility], [place] [address] [place], [place] [address]    Report Status 11/03/2018 FINAL  Final  Aerobic/Anaerobic Culture (surgical/deep wound)     Status: None (Preliminary result)   Collection Time: 11/07/18 12:03 PM   Specimen: Abscess  Result Value Ref Range Status   Specimen Description ABSCESS LEFT RENAL  Final   Special  Requests NONE  Final   Gram Stain   Final    ABUNDANT WBC PRESENT, PREDOMINANTLY PMN NO ORGANISMS SEEN    Culture   Final    CULTURE REINCUBATED FOR BETTER GROWTH Performed at [facility], [place] [address], [place], [place] [address]    Report Status PENDING  Incomplete         Radiology Studies: [place] Abscess Drain  Result Date: 11/07/2018 INDICATION: 48 year old female with a history of renal abscess EXAM: ULTRASOUND GUIDED ABSCESS DRAINAGE MEDICATIONS: The patient is currently admitted to the hospital and receiving intravenous antibiotics. The antibiotics were administered within an appropriate time frame prior to the initiation of the procedure. ANESTHESIA/SEDATION: Fentanyl 100 mcg IV;  Versed 2.0 mg IV Moderate Sedation Time:  29 minutes The patient was continuously monitored during the procedure by the interventional radiology nurse under my direct supervision. COMPLICATIONS: None PROCEDURE: Informed written consent was obtained from the patient after a thorough discussion of the procedural risks, benefits and alternatives. All questions were addressed. Maximal Sterile Barrier Technique was utilized including caps, mask, sterile gowns, sterile gloves, sterile drape, hand hygiene and skin antiseptic. A timeout was performed prior to the initiation of the procedure. Patient position prone position on the ultrasound stretcher. Images of the left flank were performed with images stored and sent to PACs. The patient is prepped and draped in the usual sterile fashion. 1% lidocaine was used for local anesthesia. Using ultrasound guidance, 18 gauge trocar needle was advanced into the abscess of the lower pole left kidney. Once we confirmed needle position the stylet was removed aspiration of purulent material confirmed the position. Modified Seldinger technique was then used to place a 10 [place] drain into the inferior pole. Approximately 6 cc of purulent material aspirated for culture. Catheter was sutured in position and attached to bulb suction. Patient tolerated the procedure well and remained hemodynamically stable throughout. No complications were encountered and no significant blood loss. IMPRESSION: Status post ultrasound-guided drainage of lower pole abscess of the left kidney. Signed, Dulcy Fanny. Dellia Nims, RPVI [facility] Radiology Electronically Signed   By: Corrie Mckusick D.O.   On: 11/07/2018 14:28   Ct Abdomen Pelvis W Contrast  Result Date: 11/06/2018 CLINICAL DATA:  Abdominal pain, fever, abscess suspected. History of LEFT percutaneous nephrostomy tube insertion on September 27th. Tube exchanged on October 2nd. EXAM: CT ABDOMEN AND  PELVIS WITH CONTRAST TECHNIQUE: Multidetector CT imaging of the abdomen and pelvis was performed using the standard protocol following bolus administration of intravenous contrast. CONTRAST:  139mL OMNIPAQUE IOHEXOL 300 MG/ML  SOLN COMPARISON:  CT abdomen dated 11/02/2018. FINDINGS: Lower chest: Small patchy consolidations at both lung bases, likely atelectasis. Small bilateral pleural effusions. Hepatobiliary: No focal liver abnormality is seen. No gallstones, gallbladder wall thickening, or biliary dilatation. Pancreas: Unremarkable. No pancreatic ductal dilatation or surrounding inflammatory changes. Spleen: Normal in size without focal abnormality. Adrenals/Urinary Tract: LEFT-sided percutaneous nephrostomy tube in place. Tube appears appropriately positioned, terminating in the LEFT renal pelvis. No hydronephrosis. Complex hypodense collection within the posterior cortex of the lower LEFT kidney, measuring 3.6 x 2.8 x 4.3 cm (transverse by AP by craniocaudal dimensions), abscess versus hematoma. RIGHT kidney appears normal. No ureteral calculi appreciated. Bladder is unremarkable. Stomach/Bowel: No dilated large or small bowel loops. No evidence of bowel wall inflammation seen. Stomach is unremarkable. Vascular/Lymphatic: No significant vascular findings are present. No enlarged abdominal or pelvic lymph nodes. Reproductive: Presumed hysterectomy. Other: No free intraperitoneal  air. Musculoskeletal: No acute or suspicious osseous finding. Ill-defined fluid/edema within the subcutaneous soft tissues of the lower abdomen and pelvis indicating anasarca. IMPRESSION: 1. Complex hypodense collection within the posterior cortex of the lower LEFT kidney, measuring 3.6 x 2.8 x 4.3 cm, possible abscess, alternatively liquified hematoma related to the earlier nephrostomy tube placement. There is no air within the collection to confirm renal cortex abscess. 2. LEFT-sided percutaneous nephrostomy tube appears appropriately  positioned, terminating in the LEFT renal pelvis. No hydronephrosis. 3. Small patchy consolidations at both lung bases, likely atelectasis. Small bilateral pleural effusions. 4. Anasarca. Electronically Signed   By: Franki Cabot M.D.   On: 11/06/2018 19:43        Scheduled Meds:  acetaminophen  1,000 mg Oral Q6H   Chlorhexidine Gluconate Cloth  6 each Topical Daily   mouth rinse  15 mL Mouth Rinse BID   nystatin  5 mL Mouth/Throat QID   pantoprazole  40 mg Oral Daily   polyethylene glycol  17 g Oral Daily   senna-docusate  2 tablet Oral QHS   sodium chloride flush  5 mL Intracatheter Q8H   tamsulosin  0.4 mg Oral Daily   Continuous Infusions:  sodium chloride     cefTRIAXone (ROCEPHIN)  IV 2 g (11/07/18 2342)   ondansetron (ZOFRAN) IV 8 mg (11/04/18 2233)     LOS: 10 days     Georgette Shell, MD  If 7PM-7AM, please contact night-coverage [email or web address] Password [email or web address] 11/08/2018, 1:04 PM

## 2018-11-08 NOTE — Progress Notes (Signed)
Physical Therapy Treatment Patient Details Name: Tamara Watson MRN: [id number] DOB: March 25, 1970 Today's Date: 11/08/2018    History of Present Illness Tamara Watson is a 48 y.o. female presenting with presumed sepsis with lactic acidosis most likely due to ureteral stone . PMH is significant for history of anemia due to menorrhagia and fibroids (s/p partial hysterectomy 06/2018), anxiety, constipation, GERD, and seasonal allergies. pt s/p nephrostomy tube placement by IR on 9/27.  Now s/p L JP drain insertion on 10/6.    PT Comments    Patient limited by pain since last drain placement, but still functioning well for bathroom trips on her own.  Will have good family support and hopeful to d/c home tomorrow.  No follow up PT needs at this time.  Will check back if not d/c.    Follow Up Recommendations  No PT follow up;Supervision/Assistance - 24 hour     Equipment Recommendations  None recommended by PT    Recommendations for Other Services       Precautions / Restrictions Precautions Precautions: Fall;None Precaution Comments: L nephrostomy and JP drain    Mobility  Bed Mobility Overal bed mobility: Independent             General bed mobility comments: up from R side and back to R side with time, but no assist pt managing drains as well  Transfers Overall transfer level: Modified independent   Transfers: Sit to/from Stand           General transfer comment: used foot board and momentum to stand, limited by pain  Ambulation/Gait Ambulation/Gait assistance: Supervision Gait Distance (Feet): 20 Feet(x 2) Assistive device: None Gait Pattern/deviations: Decreased stride length;Trunk flexed     General Gait Details: to bathroom and back to bed; antalgic with back/L side pain; limited ambulation due to pain   Stairs             Wheelchair Mobility    Modified Rankin (Stroke Patients Only)       Balance Overall balance assessment:  Modified Independent     Sitting balance - Comments: sitting on toilet able to perform self hygiene       Standing balance comment: washed hands and managed drains no LOB                            Cognition Arousal/Alertness: Awake/alert Behavior During Therapy: WFL for tasks assessed/performed Overall Cognitive Status: Within Functional Limits for tasks assessed                                        Exercises      General Comments        Pertinent Vitals/Pain Pain Score: 8  Pain Location: Lft flank Pain Descriptors / Indicators: Sharp Pain Intervention(s): Monitored during session;Repositioned    Home Living                      Prior Function            PT Goals (current goals can now be found in the care plan section) Progress towards PT goals: Progressing toward goals    Frequency    Min 3X/week      PT Plan Current plan remains appropriate    Co-evaluation  AM-PAC PT "6 Clicks" Mobility   Outcome Measure  Help needed turning from your back to your side while in a flat bed without using bedrails?: None Help needed moving from lying on your back to sitting on the side of a flat bed without using bedrails?: None Help needed moving to and from a bed to a chair (including a wheelchair)?: None Help needed standing up from a chair using your arms (e.g., wheelchair or bedside chair)?: None Help needed to walk in hospital room?: A Little Help needed climbing 3-5 steps with a railing? : A Little 6 Click Score: 22    End of Session   Activity Tolerance: Patient limited by pain Patient left: in bed;with call bell/phone within reach   PT Visit Diagnosis: Difficulty in walking, not elsewhere classified (R26.2);Muscle weakness (generalized) (M62.81)     Time: [id number] PT Time Calculation (min) (ACUTE ONLY): 13 min  Charges:  $Therapeutic Activity: 8-22 mins                     Tamara Watson,  [facility] [phone] 11/08/2018    Tamara Watson 11/08/2018, 5:14 PM

## 2018-11-09 MED ORDER — CYCLOBENZAPRINE HCL 5 MG PO TABS: 5.0000 mg | ORAL_TABLET | Freq: Three times a day (TID) | ORAL | 0 refills | Status: DC | PRN

## 2018-11-09 MED ORDER — ONDANSETRON HCL 8 MG PO TABS: 8.0000 mg | ORAL_TABLET | Freq: Four times a day (QID) | ORAL | 0 refills | Status: DC | PRN

## 2018-11-09 MED ORDER — POLYETHYLENE GLYCOL 3350 17 G PO PACK: 17.0000 g | PACK | Freq: Every day | ORAL | 0 refills | Status: DC

## 2018-11-09 MED ORDER — CEPHALEXIN 500 MG PO CAPS: 500.0000 mg | ORAL_CAPSULE | Freq: Four times a day (QID) | ORAL | 0 refills | Status: AC

## 2018-11-09 MED ORDER — HYDROXYZINE HCL 10 MG PO TABS: 10.0000 mg | ORAL_TABLET | Freq: Three times a day (TID) | ORAL | 0 refills | Status: DC | PRN

## 2018-11-09 MED ORDER — SENNOSIDES-DOCUSATE SODIUM 8.6-50 MG PO TABS: 2.0000 | ORAL_TABLET | Freq: Every day | ORAL | Status: AC

## 2018-11-09 MED ORDER — KETOROLAC TROMETHAMINE 15 MG/ML IJ SOLN
15.0000 mg | Freq: Once | INTRAMUSCULAR | Status: AC
  Administered 2018-11-09: 15 mg via INTRAVENOUS
  Filled 2018-11-09: qty 1

## 2018-11-09 MED ORDER — OXYCODONE-ACETAMINOPHEN 5-325 MG PO TABS: 1.0000 | ORAL_TABLET | ORAL | 0 refills | Status: DC | PRN

## 2018-11-09 NOTE — Discharge Summary (Signed)
Physician Discharge Summary  TRINA DEATON [id number] DOB: 23-Sep-1970 DOA: 10/29/2018  PCP: Haywood Pao, MD  Admit date: 10/29/2018 Discharge date: 11/09/2018  Admitted From: Home Disposition: Home Recommendations for Outpatient Follow-up:  1. Follow up with PCP in 1-2 weeks 2. Please obtain BMP/CBC in one week 3. Please follow up with urology and interventional radiology  Home Health none Equipment/Devices none  Discharge Condition: Stable CODE STATUS full code Diet recommendation: Cardiac  brief/Interim Summary:48 y.o.female that presented secondary to sepsis in setting of pylonephritis and bacteremia from klebsiella pneumoniae infection. On antibiotics. S/p perc drain. Repeat abdominal CT suggesting another abscess.   Discharge Diagnoses:  Active Problems:   Sepsis ([place])   Pyelonephritis   Left ureteral stone   Tachycardia   Fever   Renal abscess, left   Nausea   Kidney stone    #1 sepsis present on admission secondary to pyelonephritis and Klebsiella bacteremia.  She was treated with ceftriaxone.  She will be discharged home on Keflex for 10 more days.  Repeat blood cultures 10 /1 no growth.  Has been afebrile last 48  hours blood pressure stable.  #2 pyelonephritis with renal abscess in the setting of nephrolithiasis status post left percutaneous nephrostomy tube insertion 10/29/2018 by IR.  Since patient had persistent fever repeat CT of the abdomen showed possible new abscess is now status post JP drain draining blood-tinged drainage.  He is status post ultrasound-guided left lower pole renal abscess drainage.  Follow-up culture with few gram-negative rods.   Patient needs to follow-up with IR in 1 week for possible removal of the JP drain.  Patient had severe pain at the left flank.  IR evaluated the patient again and the drain was in place and was draining clear.  This pain probably is related to the 5 mm renal stone in the same area.  I will discharge  her on Percocet, Flexeril.  #3 nausea/vomiting patient tolerated p.o. intake prior to discharge and was anxious to go home.     Estimated body mass index is 35.84 kg/m as calculated from the following:   Height as of this encounter: 5' 5.5" (1.664 m).   Weight as of this encounter: 99.2 kg.  Discharge Instructions  Discharge Instructions    Call MD for:  difficulty breathing, headache or visual disturbances   Complete by: As directed    Call MD for:  persistant nausea and vomiting   Complete by: As directed    Call MD for:  redness, tenderness, or signs of infection (pain, swelling, redness, odor or green/yellow discharge around incision site)   Complete by: As directed    Call MD for:  temperature >100.4   Complete by: As directed    Diet - low sodium heart healthy   Complete by: As directed    Increase activity slowly   Complete by: As directed      Allergies as of 11/09/2018   No Known Allergies     Medication List    STOP taking these medications   acetaminophen 500 MG tablet Commonly known as: TYLENOL   benzonatate 100 MG capsule Commonly known as: TESSALON   docusate sodium 100 MG capsule Commonly known as: COLACE   HYDROcodone-acetaminophen 5-325 MG tablet Commonly known as: NORCO/VICODIN   ibuprofen 200 MG tablet Commonly known as: ADVIL   ibuprofen 600 MG tablet Commonly known as: ADVIL   lidocaine 5 % Commonly known as: LIDODERM   Linzess 72 MCG capsule Generic drug: linaclotide  pantoprazole 40 MG tablet Commonly known as: PROTONIX     TAKE these medications   cephALEXin 500 MG capsule Commonly known as: KEFLEX Take 1 capsule (500 mg total) by mouth 4 (four) times daily for 10 days.   cyclobenzaprine 5 MG tablet Commonly known as: FLEXERIL Take 1 tablet (5 mg total) by mouth 3 (three) times daily as needed for muscle spasms.   Dexilant 60 MG capsule Generic drug: dexlansoprazole TAKE 1 CAPSULE BY MOUTH DAILY BEFORE BREAKFAST. What  changed:   how much to take  how to take this  when to take this  additional instructions   fluticasone 50 MCG/ACT nasal spray Commonly known as: FLONASE Place 2 sprays into both nostrils daily. What changed:   when to take this  reasons to take this   hydrOXYzine 10 MG tablet Commonly known as: ATARAX/VISTARIL Take 1 tablet (10 mg total) by mouth 3 (three) times daily as needed for itching, anxiety, nausea or vomiting.   ondansetron 8 MG tablet Commonly known as: ZOFRAN Take 1 tablet (8 mg total) by mouth every 6 (six) hours as needed for nausea or vomiting. What changed:   medication strength  how much to take  when to take this  reasons to take this   oxyCODONE-acetaminophen 5-325 MG tablet Commonly known as: Percocet Take 1 tablet by mouth every 4 (four) hours as needed for severe pain. What changed:   how much to take  when to take this  reasons to take this   polyethylene glycol 17 g packet Commonly known as: MIRALAX / GLYCOLAX Take 17 g by mouth daily. Start taking on: November 10, 2018   senna-docusate 8.6-50 MG tablet Commonly known as: Senokot-S Take 2 tablets by mouth at bedtime. What changed:   when to take this  reasons to take this   tamsulosin 0.4 MG Caps capsule Commonly known as: Flomax Take 1 capsule (0.4 mg total) by mouth daily.   vitamin C 500 MG tablet Commonly known as: ASCORBIC ACID Take 500 mg by mouth daily as needed (immune system boost/ cold symptoms).      Follow-up Information    [name], [name] Him, MD Follow up.   Specialty: Internal Medicine Contact information: [address] [place] [address] [phone]        Cleon Gustin, MD Follow up.   Specialty: Urology Contact information: [place] [address] [phone]        Corrie Mckusick, DO Follow up.   Specialties: Interventional Radiology, Radiology Why: follow up in one week to assess the JP drain Contact  information: [place] [address] [phone]          No Known Allergies  Consultations: Interventional radiology urology  Procedures/Studies: Ct Abdomen Pelvis Wo Contrast  Result Date: 11/03/2018 CLINICAL DATA:  Complicated pyelonephritis. Left nephrostomy tube in place draining blood tinged fluid. EXAM: CT ABDOMEN AND PELVIS WITHOUT CONTRAST TECHNIQUE: Multidetector CT imaging of the abdomen and pelvis was performed following the standard protocol without IV contrast. COMPARISON:  CT 10/30/2018, 10/29/2018, additional priors. FINDINGS: Lower chest: Small bilateral pleural effusions, left greater than right, slight increase from prior exam. Associated atelectasis. Hepatobiliary: Hepatic steatosis. Gallbladder physiologically distended, no calcified stone. No biliary dilatation. Pancreas: No ductal dilatation or inflammation. Spleen: Normal in size without focal abnormality. Adrenals/Urinary Tract: Normal adrenal glands. Left nephrostomy tube unchanged position with tip in the renal pelvis. No abnormality along the course of the nephrostomy tubing. Left  hydronephrosis has near completely resolved. Persistent and slight worsening left perinephric edema. Questionable developing low-density region in the inferior left kidney, series 3, image 37. There is persistent left periureteric edema with 5 mm stone at the left ureterovesicular junction. Urinary bladder physiologically distended. No bladder stone. No right hydronephrosis or urolithiasis. Stomach/Bowel: Stomach is within normal limits. Appendix appears normal. No evidence of bowel wall thickening, distention, or inflammatory changes. Administered enteric contrast reaches the distal colon. Vascular/Lymphatic: Abdominal aorta is normal in caliber. Probable small left retroperitoneal nodes not well assessed. No bulky abdominopelvic adenopathy. Reproductive: 2.8 cm cyst in the right ovary is unchanged in size, probable  fluid level suggesting hemorrhagic cyst. Left ovary is unremarkable. Post hysterectomy. Other: Mild generalized body wall edema. No free fluid or free air in the abdomen or pelvis. Musculoskeletal: There are no acute or suspicious osseous abnormalities. IMPRESSION: 1. Unchanged position of left nephrostomy tube with near complete resolution of left hydronephrosis. Persistent left periureteric edema with 5 mm stone at the left ureterovesicular junction. 2. Questionable developing low-density phlegmon/abscess in the inferior left kidney, poorly defined in the absence of IV contrast. Left perinephric edema has increased. 3. Small bilateral pleural effusions, left greater than right, slight increase in size from prior exam. 4. Hepatic steatosis. Electronically Signed   By: Keith Rake M.D.   On: 11/03/2018 00:12   Ct Abdomen Pelvis Wo Contrast  Result Date: 10/30/2018 CLINICAL DATA:  Flank pain. Pain after left percutaneous nephrostomy tube placement 1 flushing catheter. EXAM: CT ABDOMEN AND PELVIS WITHOUT CONTRAST TECHNIQUE: Multidetector CT imaging of the abdomen and pelvis was performed following the standard protocol without IV contrast. COMPARISON:  CT yesterday. FINDINGS: Lower chest: Left pleural effusion with adjacent atelectasis, minimally increased from prior. Trace right pleural effusion is new. Hepatobiliary: No focal hepatic abnormality on noncontrast exam. Mild steatosis. Prominent size liver spanning 22 cm cranial caudal. Gallbladder physiologically distended, no calcified stone. No biliary dilatation. Pancreas: No ductal dilatation or inflammation. Spleen: Normal in size without focal abnormality. Adrenals/Urinary Tract: Normal adrenal glands. Percutaneous left nephrostomy tube with pigtail in the renal pelvis. No fluid collection or abnormality along the course of the tubing. Persistent but slight decreased left hydronephrosis from prior exam. Persistent left hydroureter and periureteric  stranding with obstructing 5 mm stone in the distal ureter. The stone is not significantly changed in position from prior exam. No evidence of perinephric or intra nephric fluid collection. No right hydronephrosis. No right urolithiasis. Right ureter is decompressed. Urinary bladder is minimally distended. No bladder stone. Stomach/Bowel: Stomach is within normal limits. Appendix appears normal. No evidence of bowel wall thickening, distention, or inflammatory changes. Vascular/Lymphatic: Abdominal aorta is normal in caliber. No bulky abdominopelvic adenopathy. Reproductive: Unchanged from prior. Similar 3 cm right ovarian cyst. Prior hysterectomy. Other: Trace free fluid in the pelvis. No free air. No evidence of intra-abdominal abscess. Musculoskeletal: There are no acute or suspicious osseous abnormalities. IMPRESSION: 1. Percutaneous left nephrostomy tube with pigtail in the renal pelvis. No evidence of complication. Decreased hydronephrosis from prior exam. Unchanged obstructing 5 mm stone in the distal left ureter. 2. Left pleural effusion with adjacent atelectasis, minimally increased from prior. Trace right pleural effusion is new. 3. Mild hepatic steatosis and hepatomegaly. 4. Unchanged 3 cm right ovarian cyst. Electronically Signed   By: Keith Rake M.D.   On: 10/30/2018 23:39   [place] Abscess Drain  Result Date: 11/07/2018 INDICATION: 48 year old female with a history of renal abscess EXAM: ULTRASOUND GUIDED ABSCESS DRAINAGE MEDICATIONS:  The patient is currently admitted to the hospital and receiving intravenous antibiotics. The antibiotics were administered within an appropriate time frame prior to the initiation of the procedure. ANESTHESIA/SEDATION: Fentanyl 100 mcg IV; Versed 2.0 mg IV Moderate Sedation Time:  29 minutes The patient was continuously monitored during the procedure by the interventional radiology nurse under my direct supervision. COMPLICATIONS: None PROCEDURE: Informed written  consent was obtained from the patient after a thorough discussion of the procedural risks, benefits and alternatives. All questions were addressed. Maximal Sterile Barrier Technique was utilized including caps, mask, sterile gowns, sterile gloves, sterile drape, hand hygiene and skin antiseptic. A timeout was performed prior to the initiation of the procedure. Patient position prone position on the ultrasound stretcher. Images of the left flank were performed with images stored and sent to PACs. The patient is prepped and draped in the usual sterile fashion. 1% lidocaine was used for local anesthesia. Using ultrasound guidance, 18 gauge trocar needle was advanced into the abscess of the lower pole left kidney. Once we confirmed needle position the stylet was removed aspiration of purulent material confirmed the position. Modified Seldinger technique was then used to place a 10 [place] drain into the inferior pole. Approximately 6 cc of purulent material aspirated for culture. Catheter was sutured in position and attached to bulb suction. Patient tolerated the procedure well and remained hemodynamically stable throughout. No complications were encountered and no significant blood loss. IMPRESSION: Status post ultrasound-guided drainage of lower pole abscess of the left kidney. Signed, Dulcy Fanny. Dellia Nims, RPVI [facility] Radiology Electronically Signed   By: Corrie Mckusick D.O.   On: 11/07/2018 14:28   Ct Abdomen Pelvis W Contrast  Result Date: 11/06/2018 CLINICAL DATA:  Abdominal pain, fever, abscess suspected. History of LEFT percutaneous nephrostomy tube insertion on September 27th. Tube exchanged on October 2nd. EXAM: CT ABDOMEN AND PELVIS WITH CONTRAST TECHNIQUE: Multidetector CT imaging of the abdomen and pelvis was performed using the standard protocol following bolus administration of intravenous contrast. CONTRAST:  131mL OMNIPAQUE IOHEXOL 300 MG/ML  SOLN  COMPARISON:  CT abdomen dated 11/02/2018. FINDINGS: Lower chest: Small patchy consolidations at both lung bases, likely atelectasis. Small bilateral pleural effusions. Hepatobiliary: No focal liver abnormality is seen. No gallstones, gallbladder wall thickening, or biliary dilatation. Pancreas: Unremarkable. No pancreatic ductal dilatation or surrounding inflammatory changes. Spleen: Normal in size without focal abnormality. Adrenals/Urinary Tract: LEFT-sided percutaneous nephrostomy tube in place. Tube appears appropriately positioned, terminating in the LEFT renal pelvis. No hydronephrosis. Complex hypodense collection within the posterior cortex of the lower LEFT kidney, measuring 3.6 x 2.8 x 4.3 cm (transverse by AP by craniocaudal dimensions), abscess versus hematoma. RIGHT kidney appears normal. No ureteral calculi appreciated. Bladder is unremarkable. Stomach/Bowel: No dilated large or small bowel loops. No evidence of bowel wall inflammation seen. Stomach is unremarkable. Vascular/Lymphatic: No significant vascular findings are present. No enlarged abdominal or pelvic lymph nodes. Reproductive: Presumed hysterectomy. Other: No free intraperitoneal air. Musculoskeletal: No acute or suspicious osseous finding. Ill-defined fluid/edema within the subcutaneous soft tissues of the lower abdomen and pelvis indicating anasarca. IMPRESSION: 1. Complex hypodense collection within the posterior cortex of the lower LEFT kidney, measuring 3.6 x 2.8 x 4.3 cm, possible abscess, alternatively liquified hematoma related to the earlier nephrostomy tube placement. There is no air within the collection to confirm renal cortex abscess. 2. LEFT-sided percutaneous nephrostomy tube appears appropriately positioned, terminating in the LEFT renal pelvis. No hydronephrosis. 3. Small patchy consolidations at both  lung bases, likely atelectasis. Small bilateral pleural effusions. 4. Anasarca. Electronically Signed   By: Franki Cabot  M.D.   On: 11/06/2018 19:43   Dg Chest Port 1 View  Result Date: 11/02/2018 CLINICAL DATA:  New onset fevers EXAM: PORTABLE CHEST 1 VIEW COMPARISON:  10/29/2018 FINDINGS: Cardiac shadow is prominent accentuated by the portable technique. Increasing density in the left base is noted likely related to underlying atelectasis. No other focal abnormality is seen. IMPRESSION: Increasing parenchymal opacity in the left base consistent with increasing atelectasis. Electronically Signed   By: Inez Catalina M.D.   On: 11/02/2018 12:31   Dg Chest Port 1 View  Result Date: 10/29/2018 CLINICAL DATA:  Tachycardia.  Left flank and abdominal pain. EXAM: PORTABLE CHEST 1 VIEW COMPARISON:  None. FINDINGS: The heart size and mediastinal contours are within normal limits. Opacity in the left base is identified which may represent atelectasis or infiltrate. The visualized skeletal structures are unremarkable. IMPRESSION: Left base opacity which may represent atelectasis or infiltrate Electronically Signed   By: Kerby Moors M.D.   On: 10/29/2018 20:14   Dg Chest Port 1 View  Result Date: 10/29/2018 CLINICAL DATA:  Pt states diagnosed with renal stones Friday, states pain has gotten worse and she has been febrile at home, unsure how high. C/o L flank and abdominal pain. Pt denies vomiting but endorses nausea. Denies blood in urine. Last pai.*comment was truncated*Sepsis EXAM: PORTABLE CHEST 1 VIEW COMPARISON:  None. FINDINGS: Normal cardiac silhouette. Mild central venous congestion. No focal infiltrate. No pneumothorax. IMPRESSION: Mild central venous congestion. Electronically Signed   By: Suzy Bouchard M.D.   On: 10/29/2018 15:41   Ct Renal Stone Study  Result Date: 10/29/2018 CLINICAL DATA:  LEFT flank pain abdominal pain.  Nausea EXAM: CT ABDOMEN AND PELVIS WITHOUT CONTRAST TECHNIQUE: Multidetector CT imaging of the abdomen and pelvis was performed following the standard protocol without IV contrast. COMPARISON:   10/27/2018 FINDINGS: Lower chest: Lung bases clear Hepatobiliary: No focal hepatic lesion.  Gallbladder normal. Pancreas: Pancreas normal Spleen: Normal spleen Adrenals/Urinary Tract: Adrenal glands are normal. Mild renal edema, hydronephrosis, and hydroureter of the LEFT kidney again noted. The distal LEFT ureteral calculus has migrated approximately 1 cm compared to exam 10/27/2018. Calculus position at the level the LEFT operator space measuring 4 mm by 6 mm (image 77/3). Calculus is approximately 2 cm from the vesicoureteral junction. No nephrolithiasis.  No RIGHT ureterolithiasis.  No bladder calculi Stomach/Bowel: Stomach, small-bowel, appendix and cecum normal. The colon rectosigmoid colon normal. Vascular/Lymphatic: Abdominal aorta is normal caliber. There is no retroperitoneal or periportal lymphadenopathy. No pelvic lymphadenopathy. Reproductive: Post hysterectomy. Cystic enlargement of the RIGHT ovary to 4.2 x 3.8 cm again noted. Cystic component measures approximately 3.1 cm. Other: No free fluid Musculoskeletal: No aggressive osseous lesion IMPRESSION: 1. Partially obstructing calculus in the distal LEFT ureter has migrated approximately 1 cm towards the vesicoureteral junction. 2. Cystic lesion of the RIGHT ovary. No follow-up necessary in this age group. This recommendation follows ACR consensus guidelines: White Paper of the ACR Incidental Findings Committee II on Adnexal Findings. J Am Coll Radiol [id number]. Electronically Signed   By: Suzy Bouchard M.D.   On: 10/29/2018 15:37   Ct Renal Stone Study  Result Date: 10/27/2018 CLINICAL DATA:  Left flank and lower abdominal pain with nausea and vomiting. EXAM: CT ABDOMEN AND PELVIS WITHOUT CONTRAST TECHNIQUE: Multidetector CT imaging of the abdomen and pelvis was performed following the standard protocol without IV contrast. COMPARISON:  None. FINDINGS: Lower chest: Mild dependent bibasilar atelectasis. No pleural or pericardial effusion.  Hepatobiliary: No focal liver abnormality is seen. No gallstones, gallbladder wall thickening, or biliary dilatation. Pancreas: Unremarkable. No pancreatic ductal dilatation or surrounding inflammatory changes. Spleen: Normal in size without focal abnormality. Adrenals/Urinary Tract: The adrenal glands appear normal. There is mild to moderate left hydronephrosis with stranding about the left kidney and ureter due to a 0.5 cm distal left ureteral stone. No other urinary tract stones are identified. The kidneys otherwise appear normal. Urinary bladder is decompressed but otherwise unremarkable. Stomach/Bowel: Stomach is within normal limits. Appendix appears normal. No evidence of bowel wall thickening, distention, or inflammatory changes. Vascular/Lymphatic: No significant vascular findings are present. No enlarged abdominal or pelvic lymph nodes. Reproductive: Status post hysterectomy. No adnexal masses. Other: Small fat containing umbilical hernia noted. Stranding in subcutaneous fat anterior low pelvis is likely postoperative. Musculoskeletal: No acute or focal abnormality. IMPRESSION: Mild-to-moderate left hydronephrosis due to a 0.5 cm distal left ureteral stone. No other urinary tract stones are seen. Small fat containing umbilical hernia. Electronically Signed   By: Inge Rise M.D.   On: 10/27/2018 09:24   Ir Nephrostomy Placement Left  Result Date: 10/29/2018 CLINICAL DATA:  Obstructing left ureteral calculus, sepsis and need for emergent percutaneous nephrostomy tube placement. EXAM: 1. ULTRASOUND GUIDANCE FOR PUNCTURE OF THE LEFT RENAL COLLECTING SYSTEM. 2. LEFT PERCUTANEOUS NEPHROSTOMY TUBE PLACEMENT. COMPARISON:  CT of the abdomen and pelvis earlier today. ANESTHESIA/SEDATION: 2.0 mg IV Versed; 100 mcg IV Fentanyl. Total Moderate Sedation Time 22 minutes. The patient's level of consciousness and physiologic status were continuously monitored during the procedure by Radiology nursing. CONTRAST:   20 mL Omnipaque 300 MEDICATIONS: No additional medications. A scheduled dose of IV vancomycin was infusing during the procedure. FLUOROSCOPY TIME:  1 minutes and 36 seconds.  12.4 mGy. PROCEDURE: The procedure, risks, benefits, and alternatives were explained to the patient. Questions regarding the procedure were encouraged and answered. The patient understands and consents to the procedure. A time-out was performed prior to initiating the procedure. The left flank region was prepped with chlorhexidine in a sterile fashion, and a sterile drape was applied covering the operative field. A sterile gown and sterile gloves were used for the procedure. Local anesthesia was provided with 1% Lidocaine. Ultrasound was used to localize the left kidney. Under direct ultrasound guidance, a 21 gauge needle was advanced into the renal collecting system. Ultrasound image documentation was performed. Aspiration of urine sample was performed followed by contrast injection. A transitional dilator was advanced over a guidewire. Percutaneous tract dilatation was then performed over the guidewire. A 10-French percutaneous nephrostomy tube was then advanced and formed in the collecting system. Catheter position was confirmed by fluoroscopy after contrast injection. The catheter was secured at the skin with a Prolene retention suture and Stat-Lock device. A gravity bag was placed. COMPLICATIONS: None. FINDINGS: Ultrasound demonstrates hydronephrosis. After puncture of the lower pole collecting system, there was return of clear but foul-smelling urine. 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 After nephrostomy tube placement, the tube could not be completely formed in the renal pelvis due to the duplicated nature of the collecting system and small size of the renal pelvis. There was some acute bleeding after nephrostomy tube placement with bloody urine return after tube placement. A urine sample was sent  for culture analysis. IMPRESSION: Placement of 10 French left percutaneous nephrostomy tube. The tube was attached to gravity bag drainage. Initial urine output is bloody  after tube placement. A urine sample was sent for culture analysis. Electronically Signed   By: Aletta Edouard M.D.   On: 10/29/2018 19:23   Ir Nephrostomy Exchange Left  Result Date: 11/04/2018 INDICATION: 48 year old female with a history of prior left-sided percutaneous nephrostomy EXAM: IR EXCHANGE NEPHROSTOMY LEFT COMPARISON:  None. MEDICATIONS: None ANESTHESIA/SEDATION: Fentanyl 75 mcg IV; Versed 1.5 mg IV Moderate Sedation Time:  12 minutes The patient was continuously monitored during the procedure by the interventional radiology nurse under my direct supervision. CONTRAST:  9mL OMNIPAQUE IOHEXOL 300 MG/ML SOLN - administered into the collecting system(s) FLUOROSCOPY TIME:  Fluoroscopy Time: 1 minutes 18 seconds (13 mGy). COMPLICATIONS: None PROCEDURE: Informed written consent was obtained from the patient after a thorough discussion of the procedural risks, benefits and alternatives. All questions were addressed. Maximal Sterile Barrier Technique was utilized including caps, mask, sterile gowns, sterile gloves, sterile drape, hand hygiene and skin antiseptic. A timeout was performed prior to the initiation of the procedure. Patient position prone position on the fluoroscopy table the left flank was prepped and draped in the usual sterile fashion. 1% lidocaine was used for local anesthesia. 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 Small amount of contrast confirmed location. Catheter was sutured in position and attached to gravity drainage. Patient tolerated the procedure well and remained hemodynamically stable throughout. No complications were  encountered and no significant blood loss. IMPRESSION: Status post routine exchange of left-sided percutaneous nephrostomy. Signed, Dulcy Fanny. Dellia Nims, RPVI [facility] Radiology Electronically Signed   By: Corrie Mckusick D.O.   On: 11/04/2018 08:14   Vas [place] Lower Extremity Venous (dvt)  Result Date: 10/31/2018  Lower Venous Study Indications: Elevated d-dimer.  Limitations: Body habitus. Comparison Study: No prior study. Performing Technologist: Maudry Mayhew MHA, RDMS, RVT, RDCS  Examination Guidelines: A complete evaluation includes B-mode imaging, spectral Doppler, color Doppler, and power Doppler as needed of all accessible portions of each vessel. Bilateral testing is considered an integral part of a complete examination. Limited examinations for reoccurring indications may be performed as noted.  +---------+---------------+---------+-----------+----------+--------------+ RIGHT    CompressibilityPhasicitySpontaneityPropertiesThrombus Aging +---------+---------------+---------+-----------+----------+--------------+ CFV      Full           Yes      Yes                                 +---------+---------------+---------+-----------+----------+--------------+ SFJ      Full                                                        +---------+---------------+---------+-----------+----------+--------------+ FV Prox  Full                                                        +---------+---------------+---------+-----------+----------+--------------+ FV Mid   Full                                                        +---------+---------------+---------+-----------+----------+--------------+  FV DistalFull                                                        +---------+---------------+---------+-----------+----------+--------------+ PFV      Full                                                         +---------+---------------+---------+-----------+----------+--------------+ POP      Full           Yes      Yes                                 +---------+---------------+---------+-----------+----------+--------------+ PTV      Full                                                        +---------+---------------+---------+-----------+----------+--------------+ PERO     Full                                                        +---------+---------------+---------+-----------+----------+--------------+   +---------+---------------+---------+-----------+----------+--------------+ LEFT     CompressibilityPhasicitySpontaneityPropertiesThrombus Aging +---------+---------------+---------+-----------+----------+--------------+ CFV      Full           Yes      Yes                                 +---------+---------------+---------+-----------+----------+--------------+ SFJ      Full                                                        +---------+---------------+---------+-----------+----------+--------------+ FV Prox  Full                                                        +---------+---------------+---------+-----------+----------+--------------+ FV Mid   Full                                                        +---------+---------------+---------+-----------+----------+--------------+ FV DistalFull                                                        +---------+---------------+---------+-----------+----------+--------------+  PFV      Full                                                        +---------+---------------+---------+-----------+----------+--------------+ POP      Full           Yes      Yes                                 +---------+---------------+---------+-----------+----------+--------------+ PTV      Full                                                         +---------+---------------+---------+-----------+----------+--------------+ PERO     Full                                                        +---------+---------------+---------+-----------+----------+--------------+  Summary: Right: There is no evidence of deep vein thrombosis in the lower extremity. No cystic structure found in the popliteal fossa. Left: There is no evidence of deep vein thrombosis in the lower extremity. No cystic structure found in the popliteal fossa.  *See table(s) above for measurements and observations. Electronically signed by Deitra Mayo MD on 10/31/2018 at 4:34:24 PM.    Final    (Echo, Carotid, EGD, Colonoscopy, ERCP)    Subjective: C/o pain left cva having spasms no fever chills nausea vomiting  Discharge Exam: Vitals:   11/09/18 1000 11/09/18 1302  BP: (!) 142/89 (!) 108/58  Pulse: 88   Resp: 18 18  Temp: 98.9 F (37.2 C) 99 F (37.2 C)  SpO2: 100%    Vitals:   11/09/18 0003 11/09/18 0320 11/09/18 1000 11/09/18 1302  BP: (!) 125/54 110/68 (!) 142/89 (!) 108/58  Pulse: 89 94 88   Resp:   18 18  Temp: 98.4 F (36.9 C) 98.7 F (37.1 C) 98.9 F (37.2 C) 99 F (37.2 C)  TempSrc: Oral Oral Oral Oral  SpO2: 100% 100% 100%   Weight:      Height:        General: Pt is alert, awake, not in acute distress Cardiovascular: RRR, S1/S2 +, no rubs, no gallops Respiratory: CTA bilaterally, no wheezing, no rhonchi Abdominal: Soft, NT, ND, bowel sounds + JP drain and percutaneous nephrostomy drains in place Extremities: no edema, no cyanosis    The results of significant diagnostics from this hospitalization (including imaging, microbiology, ancillary and laboratory) are listed below for reference.     Microbiology: Recent Results (from the past 240 hour(s))  Culture, blood (routine x 2)     Status: None   Collection Time: 11/02/18 12:41 PM   Specimen: BLOOD  Result Value Ref Range Status   Specimen Description BLOOD RIGHT ANTECUBITAL   Final   Special Requests   Final    BOTTLES DRAWN AEROBIC AND ANAEROBIC Blood Culture adequate volume   Culture   Final    NO GROWTH  5 DAYS Performed at [facility], [place] [address], [place], [place] [address]    Report Status 11/07/2018 FINAL  Final  Culture, blood (routine x 2)     Status: None   Collection Time: 11/02/18 12:41 PM   Specimen: BLOOD  Result Value Ref Range Status   Specimen Description BLOOD LEFT ANTECUBITAL  Final   Special Requests   Final    BOTTLES DRAWN AEROBIC AND ANAEROBIC Blood Culture adequate volume   Culture   Final    NO GROWTH 5 DAYS Performed at [facility], [place] [address], [place], [place] [address]    Report Status 11/07/2018 FINAL  Final  Culture, Urine     Status: None   Collection Time: 11/03/18 12:10 AM   Specimen: Urine, Random  Result Value Ref Range Status   Specimen Description URINE, RANDOM  Final   Special Requests NONE  Final   Culture   Final    NO GROWTH Performed at [facility], [place] [address], [place], [place] [address]    Report Status 11/03/2018 FINAL  Final  Aerobic/Anaerobic Culture (surgical/deep wound)     Status: None (Preliminary result)   Collection Time: 11/07/18 12:03 PM   Specimen: Abscess  Result Value Ref Range Status   Specimen Description ABSCESS LEFT RENAL  Final   Special Requests NONE  Final   Gram Stain   Final    ABUNDANT WBC PRESENT, PREDOMINANTLY PMN NO ORGANISMS SEEN    Culture   Final    RARE GRAM NEGATIVE RODS IDENTIFICATION AND SUSCEPTIBILITIES TO FOLLOW Performed at [facility], [place] [address], [place], [place] [address]    Report Status PENDING  Incomplete     Labs: BNP (last 3 results) No results for input(s): BNP in the last 8760 hours. Basic Metabolic Panel: Recent Labs  Lab 11/03/18 0614 11/04/18 0745 11/06/18 0957 11/07/18 0850 11/08/18 0331  NA 136 138 141 139 140  K 3.3* 3.5 3.2* 3.4* 3.5  CL 108 108 109 104 106  CO2 20* 21* 23 24 25   GLUCOSE  94 106* 111* 110* 96  BUN 5* 5* <5* <5* <5*  CREATININE 0.99 0.95 0.85 0.87 0.92  CALCIUM 7.7* 7.9* 7.9* 8.2* 8.4*  MG  --   --  1.9 2.0 2.0  PHOS 2.8 3.0 2.6  --  3.9   Liver Function Tests: Recent Labs  Lab 11/03/18 0614 11/04/18 0745 11/06/18 0957 11/08/18 0331  ALBUMIN 1.7* 1.9* 2.1* 2.2*   No results for input(s): LIPASE, AMYLASE in the last 168 hours. No results for input(s): AMMONIA in the last 168 hours. CBC: Recent Labs  Lab 11/03/18 0614 11/04/18 0745 11/05/18 0710 11/06/18 0957 11/07/18 0850 11/08/18 0331  WBC 13.2* 13.0* 11.8* 10.1 10.1 8.1  NEUTROABS 10.0* 10.4* 9.3*  --   --   --   HGB 10.1* 10.5* 9.7* 9.7* 10.4* 10.3*  HCT 29.6* 32.0* 28.2* 29.8* 30.6* 31.1*  MCV 87.6 89.1 87.9 89.2 88.2 89.4  PLT 193 266 361 553* 641* 689*   Cardiac Enzymes: No results for input(s): CKTOTAL, CKMB, CKMBINDEX, TROPONINI in the last 168 hours. BNP: Invalid input(s): POCBNP CBG: No results for input(s): GLUCAP in the last 168 hours. D-Dimer No results for input(s): DDIMER in the last 72 hours. Hgb A1c No results for input(s): HGBA1C in the last 72 hours. Lipid Profile No results for input(s): CHOL, HDL, LDLCALC, TRIG, CHOLHDL, LDLDIRECT in the last 72 hours. Thyroid function studies  No results for input(s): TSH, T4TOTAL, T3FREE, THYROIDAB in the last 72 hours.  Invalid input(s): FREET3 Anemia work up No results for input(s): VITAMINB12, FOLATE, FERRITIN, TIBC, IRON, RETICCTPCT in the last 72 hours. Urinalysis    Component Value Date/Time   COLORURINE YELLOW 10/29/2018 1423   APPEARANCEUR HAZY (A) 10/29/2018 1423   LABSPEC 1.023 10/29/2018 1423   PHURINE 5.0 10/29/2018 1423   GLUCOSEU NEGATIVE 10/29/2018 1423   HGBUR SMALL (A) 10/29/2018 1423   BILIRUBINUR NEGATIVE 10/29/2018 1423   KETONESUR 5 (A) 10/29/2018 1423   PROTEINUR 100 (A) 10/29/2018 1423   UROBILINOGEN 1.0 05/12/2014 1940   NITRITE NEGATIVE 10/29/2018 1423   LEUKOCYTESUR NEGATIVE 10/29/2018 1423    Sepsis Labs Invalid input(s): PROCALCITONIN,  WBC,  LACTICIDVEN Microbiology Recent Results (from the past 240 hour(s))  Culture, blood (routine x 2)     Status: None   Collection Time: 11/02/18 12:41 PM   Specimen: BLOOD  Result Value Ref Range Status   Specimen Description BLOOD RIGHT ANTECUBITAL  Final   Special Requests   Final    BOTTLES DRAWN AEROBIC AND ANAEROBIC Blood Culture adequate volume   Culture   Final    NO GROWTH 5 DAYS Performed at [facility], [place] [address], [place], [place] [address]    Report Status 11/07/2018 FINAL  Final  Culture, blood (routine x 2)     Status: None   Collection Time: 11/02/18 12:41 PM   Specimen: BLOOD  Result Value Ref Range Status   Specimen Description BLOOD LEFT ANTECUBITAL  Final   Special Requests   Final    BOTTLES DRAWN AEROBIC AND ANAEROBIC Blood Culture adequate volume   Culture   Final    NO GROWTH 5 DAYS Performed at [facility], [place] [address] [place], [place] [address]    Report Status 11/07/2018 FINAL  Final  Culture, Urine     Status: None   Collection Time: 11/03/18 12:10 AM   Specimen: Urine, Random  Result Value Ref Range Status   Specimen Description URINE, RANDOM  Final   Special Requests NONE  Final   Culture   Final    NO GROWTH Performed at [facility], [place] [address] [place], [place] [address]    Report Status 11/03/2018 FINAL  Final  Aerobic/Anaerobic Culture (surgical/deep wound)     Status: None (Preliminary result)   Collection Time: 11/07/18 12:03 PM   Specimen: Abscess  Result Value Ref Range Status   Specimen Description ABSCESS LEFT RENAL  Final   Special Requests NONE  Final   Gram Stain   Final    ABUNDANT WBC PRESENT, PREDOMINANTLY PMN NO ORGANISMS SEEN    Culture   Final    RARE GRAM NEGATIVE RODS IDENTIFICATION AND SUSCEPTIBILITIES TO FOLLOW Performed at [facility], [place] [address] [place], [place] [address]    Report Status PENDING  Incomplete      Time coordinating discharge:  38 minutes  SIGNED:   Georgette Shell, MD  [facility] 11/09/2018, 1:04 PM Pager   If 7PM-7AM, please contact night-coverage [email or web address] Password [email or web address]

## 2018-11-09 NOTE — Progress Notes (Signed)
Referring Physician(s): Dr. Alinda Money  Supervising Physician: Arne Cleveland  Patient Status:  [facility] - In-pt  Chief Complaint: Urosepsis  Subjective: Still complaining of severe pain.  Trying to use PO pain medications.  She is motivated to go home, however pain is limiting.   Allergies: Patient has no known allergies.  Medications: Prior to Admission medications   Medication Sig Start Date End Date Taking? Authorizing Provider  acetaminophen (TYLENOL) 500 MG tablet Take 1,000 mg by mouth every 6 (six) hours as needed for headache.   Yes [provider]  dexlansoprazole (DEXILANT) 60 MG capsule TAKE 1 CAPSULE BY MOUTH DAILY BEFORE BREAKFAST. Patient taking differently: Take 60 mg by mouth daily before breakfast.  08/01/18  Yes Nandigam, Venia Minks, MD  fluticasone (FLONASE) 50 MCG/ACT nasal spray Place 2 sprays into both nostrils daily. Patient taking differently: Place 2 sprays into both nostrils daily as needed for allergies.  11/08/16  Yes Rogue Bussing, MD  HYDROcodone-acetaminophen (NORCO/VICODIN) 5-325 MG tablet Take 1 tablet by mouth every 6 (six) hours as needed for up to 3 days for severe pain. 10/27/18 10/30/18 Yes Madilyn Hook A, PA-C  ibuprofen (ADVIL) 200 MG tablet Take 600 mg by mouth every 6 (six) hours as needed for headache (pain).   Yes [provider]  ondansetron (ZOFRAN) 4 MG tablet Take 1 tablet (4 mg total) by mouth every 6 (six) hours. 10/27/18  Yes Alveria Apley, PA-C  oxyCODONE-acetaminophen (PERCOCET/ROXICET) 5-325 MG tablet Take 1-2 tablets by mouth every 6 (six) hours as needed for moderate pain (once the patient tolerates po and PCA has been discontinued). Patient taking differently: Take 1-2 tablets by mouth every 6 (six) hours as needed for moderate pain.  06/16/18  Yes Waymon Amato, MD  senna-docusate (SENOKOT-S) 8.6-50 MG tablet Take 2 tablets by mouth at bedtime as needed for mild constipation or moderate constipation. 06/16/18   Yes Waymon Amato, MD  tamsulosin (FLOMAX) 0.4 MG CAPS capsule Take 1 capsule (0.4 mg total) by mouth daily. 10/27/18 11/26/18 Yes Madilyn Hook A, PA-C  vitamin C (ASCORBIC ACID) 500 MG tablet Take 500 mg by mouth daily as needed (immune system boost/ cold symptoms).    Yes [provider]  benzonatate (TESSALON) 100 MG capsule Take 1 capsule (100 mg total) by mouth 3 (three) times daily as needed for cough. Patient not taking: Reported on 10/29/2018 01/09/18   Sterling Bing, DO  docusate sodium (COLACE) 100 MG capsule Take 1 capsule (100 mg total) by mouth 2 (two) times daily. Patient not taking: Reported on 10/29/2018 06/16/18   Waymon Amato, MD  ibuprofen (ADVIL) 600 MG tablet Take 1 tablet (600 mg total) by mouth every 6 (six) hours. Patient not taking: Reported on 10/29/2018 06/19/18   Waymon Amato, MD  lidocaine (LIDODERM) 5 % Place 1 patch onto the skin daily. Remove & Discard patch within 12 hours or as directed by MD Patient not taking: Reported on 10/29/2018 06/16/18   Waymon Amato, MD  LINZESS 72 MCG capsule TAKE 1 CAPSULE (72 MCG TOTAL) BY MOUTH DAILY BEFORE BREAKFAST. Patient not taking: No sig reported 11/03/17   Mauri Pole, MD  pantoprazole (PROTONIX) 40 MG tablet Take 1 tablet (40 mg total) by mouth daily. Patient not taking: Reported on 10/29/2018 06/17/18   Waymon Amato, MD     Vital Signs: BP (!) 142/89 (BP Location: Left Leg)    Pulse 88    Temp 98.9 F (37.2 C) (Oral)  Resp 18    Ht 5' 5.5" (1.664 m)    Wt 218 lb 11.1 oz (99.2 kg)    LMP 06/05/2018    SpO2 100%    BMI 35.84 kg/m   Physical Exam  NAD, alert Abdomen:  Generalized tenderness to left flank- with movement, with palpation, and at rest. L PCN in place.  Insertion site c/d/i. Pale, clear urine output.  Inferior abscess drain in place.  Insertion site c/d/i. Tube reinserted into stat-lock.  Dark red/brown output.  Imaging: [place] Abscess Drain  Result Date: 11/07/2018 INDICATION: 48 year old female with a  history of renal abscess EXAM: ULTRASOUND GUIDED ABSCESS DRAINAGE MEDICATIONS: The patient is currently admitted to the hospital and receiving intravenous antibiotics. The antibiotics were administered within an appropriate time frame prior to the initiation of the procedure. ANESTHESIA/SEDATION: Fentanyl 100 mcg IV; Versed 2.0 mg IV Moderate Sedation Time:  29 minutes The patient was continuously monitored during the procedure by the interventional radiology nurse under my direct supervision. COMPLICATIONS: None PROCEDURE: Informed written consent was obtained from the patient after a thorough discussion of the procedural risks, benefits and alternatives. All questions were addressed. Maximal Sterile Barrier Technique was utilized including caps, mask, sterile gowns, sterile gloves, sterile drape, hand hygiene and skin antiseptic. A timeout was performed prior to the initiation of the procedure. Patient position prone position on the ultrasound stretcher. Images of the left flank were performed with images stored and sent to PACs. The patient is prepped and draped in the usual sterile fashion. 1% lidocaine was used for local anesthesia. Using ultrasound guidance, 18 gauge trocar needle was advanced into the abscess of the lower pole left kidney. Once we confirmed needle position the stylet was removed aspiration of purulent material confirmed the position. Modified Seldinger technique was then used to place a 10 [place] drain into the inferior pole. Approximately 6 cc of purulent material aspirated for culture. Catheter was sutured in position and attached to bulb suction. Patient tolerated the procedure well and remained hemodynamically stable throughout. No complications were encountered and no significant blood loss. IMPRESSION: Status post ultrasound-guided drainage of lower pole abscess of the left kidney. Signed, Dulcy Fanny. Dellia Nims, RPVI [facility] Radiology  Electronically Signed   By: Corrie Mckusick D.O.   On: 11/07/2018 14:28   Ct Abdomen Pelvis W Contrast  Result Date: 11/06/2018 CLINICAL DATA:  Abdominal pain, fever, abscess suspected. History of LEFT percutaneous nephrostomy tube insertion on September 27th. Tube exchanged on October 2nd. EXAM: CT ABDOMEN AND PELVIS WITH CONTRAST TECHNIQUE: Multidetector CT imaging of the abdomen and pelvis was performed using the standard protocol following bolus administration of intravenous contrast. CONTRAST:  150mL OMNIPAQUE IOHEXOL 300 MG/ML  SOLN COMPARISON:  CT abdomen dated 11/02/2018. FINDINGS: Lower chest: Small patchy consolidations at both lung bases, likely atelectasis. Small bilateral pleural effusions. Hepatobiliary: No focal liver abnormality is seen. No gallstones, gallbladder wall thickening, or biliary dilatation. Pancreas: Unremarkable. No pancreatic ductal dilatation or surrounding inflammatory changes. Spleen: Normal in size without focal abnormality. Adrenals/Urinary Tract: LEFT-sided percutaneous nephrostomy tube in place. Tube appears appropriately positioned, terminating in the LEFT renal pelvis. No hydronephrosis. Complex hypodense collection within the posterior cortex of the lower LEFT kidney, measuring 3.6 x 2.8 x 4.3 cm (transverse by AP by craniocaudal dimensions), abscess versus hematoma. RIGHT kidney appears normal. No ureteral calculi appreciated. Bladder is unremarkable. Stomach/Bowel: No dilated large or small bowel loops. No evidence of bowel wall inflammation seen. Stomach is unremarkable.  Vascular/Lymphatic: No significant vascular findings are present. No enlarged abdominal or pelvic lymph nodes. Reproductive: Presumed hysterectomy. Other: No free intraperitoneal air. Musculoskeletal: No acute or suspicious osseous finding. Ill-defined fluid/edema within the subcutaneous soft tissues of the lower abdomen and pelvis indicating anasarca. IMPRESSION: 1. Complex hypodense collection within  the posterior cortex of the lower LEFT kidney, measuring 3.6 x 2.8 x 4.3 cm, possible abscess, alternatively liquified hematoma related to the earlier nephrostomy tube placement. There is no air within the collection to confirm renal cortex abscess. 2. LEFT-sided percutaneous nephrostomy tube appears appropriately positioned, terminating in the LEFT renal pelvis. No hydronephrosis. 3. Small patchy consolidations at both lung bases, likely atelectasis. Small bilateral pleural effusions. 4. Anasarca. Electronically Signed   By: Franki Cabot M.D.   On: 11/06/2018 19:43    Labs:  CBC: Recent Labs    11/05/18 0710 11/06/18 0957 11/07/18 0850 11/08/18 0331  WBC 11.8* 10.1 10.1 8.1  HGB 9.7* 9.7* 10.4* 10.3*  HCT 28.2* 29.8* 30.6* 31.1*  PLT 361 553* 641* 689*    COAGS: Recent Labs    10/29/18 1443 11/07/18 0850  INR 1.4* 1.2  APTT 29  --     BMP: Recent Labs    11/04/18 0745 11/06/18 0957 11/07/18 0850 11/08/18 0331  NA 138 141 139 140  K 3.5 3.2* 3.4* 3.5  CL 108 109 104 106  CO2 21* 23 24 25   GLUCOSE 106* 111* 110* 96  BUN 5* <5* <5* <5*  CALCIUM 7.9* 7.9* 8.2* 8.4*  CREATININE 0.95 0.85 0.87 0.92  GFRNONAA >60 >60 >60 >60  GFRAA >60 >60 >60 >60    LIVER FUNCTION TESTS: Recent Labs    05/24/18 1413 10/27/18 0750 10/29/18 1443 10/30/18 0017  11/03/18 0614 11/04/18 0745 11/06/18 0957 11/08/18 0331  BILITOT 0.3 0.8 1.2 1.8*  --   --   --   --   --   AST 18 18 25 26   --   --   --   --   --   ALT 13 15 20 18   --   --   --   --   --   ALKPHOS 63 49 71 117  --   --   --   --   --   PROT 7.9 7.2 6.1* 5.2*  --   --   --   --   --   ALBUMIN 3.8 3.8 2.8* 2.1*   < > 1.7* 1.9* 2.1* 2.2*   < > = values in this interval not displayed.    Assessment and Plan: Urosepsis in the setting of nephrolithiasis s/p left PCN placement 9/27 by Dr. Kathlene Cote, exchanged 10/2 by Dr. Earleen Newport Inferior pole abscess s/p drain placement 10/6 by Dr. Earleen Newport.  Patient appears to be  improving clinically. Her WBC and fevers have resolved.  She is feeling less nauseated.  Tolerating some POs. Yellow urine in collection bag today.  Dark brown fluid in abscess bulb.   She continues to complain of pain.  She is tender to palpation, but also with generalized pain and spasms at rest.  She has pain with movement.  It is difficult to isolate her pain to a singular cause as her kidney stone, intercostal nephrostomy tube, and abscess drain could all be contributing.   Spoke with hospitalist. Low threshold to re-image, however currently with clinical improvement. Question whether pain is MSK in nature, due to tube, due to stone, or all of the above. Could consider switching from  narcotics to toradol with flexeril.   Orders in place for both drains to be flushed.  Patient states she is tolerating PCN flushes now.   Continue current management.  Ultimate plan is to discharge home with IR follow-up for renal abscess drain.  Urology follow-up for PCN management as well as kidney stone removal.   Electronically Signed: Docia Barrier, PA 11/09/2018, 12:13 PM   I spent a total of 15 Minutes at the the patient's bedside AND on the patient's hospital floor or unit, greater than 50% of which was counseling/coordinating care for nephrolithiasis.

## 2018-11-09 NOTE — Plan of Care (Signed)
  Problem: Health Behavior/Discharge Planning: Goal: Ability to manage health-related needs will improve Outcome: Progressing Note: Received discharge order for patient. Discharge instructions reviewed with patient, including new medication/ prescriptions, follow-up appointment, drain care. Husband was also educated on flushing nephrostomy and jp drain, returned demonstration. At this time patient have stated understanding of discharge instructions. Patient with no further questions at this time. Pt stable in no acute distress. PIV removed, intact. Pt off unit in wheelchair. Nursing care complete.

## 2018-11-09 NOTE — Progress Notes (Signed)
Hospitalist asked RN to contact IR concerning pts drain. IR on call paged to see if they could come assess pts drain as it is causing excruciating pain. MD returned called stating that they will be up to assess the pt and drain.

## 2018-11-12 LAB — AEROBIC/ANAEROBIC CULTURE W GRAM STAIN (SURGICAL/DEEP WOUND)

## 2018-11-13 ENCOUNTER — Other Ambulatory Visit: Payer: Self-pay | Admitting: Urology

## 2018-11-13 DIAGNOSIS — N151 Renal and perinephric abscess: Secondary | ICD-10-CM

## 2018-11-15 ENCOUNTER — Other Ambulatory Visit: Payer: Self-pay | Admitting: Urology

## 2018-11-16 ENCOUNTER — Ambulatory Visit
Admission: RE | Admit: 2018-11-16 | Discharge: 2018-11-16 | Disposition: A | Discharge status: Home / Self Care | Payer: Managed Care, Other (non HMO) | Source: Ambulatory Visit | Attending: Urology | Admitting: Urology

## 2018-11-16 ENCOUNTER — Other Ambulatory Visit: Payer: Self-pay | Admitting: Urology

## 2018-11-16 ENCOUNTER — Encounter: Payer: Self-pay | Admitting: *Deleted

## 2018-11-16 DIAGNOSIS — N151 Renal and perinephric abscess: Secondary | ICD-10-CM

## 2018-11-16 HISTORY — PX: IR RADIOLOGIST EVAL & MGMT: IMG5224

## 2018-11-16 MED ORDER — IOPAMIDOL (ISOVUE-300) INJECTION 61%
125.0000 mL | Freq: Once | INTRAVENOUS | Status: AC | PRN
  Administered 2018-11-16: 13:00:00 125 mL via INTRAVENOUS

## 2018-11-16 NOTE — Progress Notes (Addendum)
Chief Complaint: Renal abscess  Referring Physician(s): McKenzie,Patrick L  Supervising Physician: Markus Daft  History of Present Illness: Tamara Watson is a 48 y.o. female who is s/p placement of Left PCN on 10/29/18.  It was exchanged on 10/2.  She had pain off and on with this PCN since placement so CT was obtained on 11/06/2018 to evaluate.  The CT showed a complex hypodense collection within the posterior cortex of the lower LEFT kidney, measuring 3.6 x 2.8 x 4.3 cm, possible abscess, alternatively liquified hematoma related to the earlier nephrostomy tube placement.  She underwent placement of a drain in the lower ole abscess of the left kidney by Dr. Earleen Newport on 11/07/2018.  She is here today for imaging and follow up.  She states she has only had about 10 mL of output in the drain since Monday.  On exam, the bulb is actually inverted and not suctioning properly.  She also states she is flushing the PCN but not the abscess drain.  No nausea/vomiting. No Fever/chills. ROS negative.  She tells me she is having surgery to remove her stone very soon.  Past Medical History:  Diagnosis Date   Allergy    Anemia    Anxiety    in school only    Constipation    sennocot and miralax    Dyspnea    when hemoglobin low   GERD (gastroesophageal reflux disease)    Headache    due to Iron deficency    Past Surgical History:  Procedure Laterality Date   BREAST LUMPECTOMY Left    20 yrs ago- benign    CYSTOSCOPY  06/14/2018   Procedure: Cystoscopy;  Surgeon: Waymon Amato, MD;  Location: [facility];  Service: Gynecology;;   DILATION AND CURETTAGE OF UTERUS     x2   HYSTERECTOMY ABDOMINAL WITH SALPINGECTOMY Bilateral 06/14/2018   Procedure: HYSTERECTOMY ABDOMINAL WITH  BILATERAL SALPINGECTOMY;  Surgeon: Waymon Amato, MD;  Location: [facility];  Service: Gynecology;  Laterality: Bilateral;   IR NEPHROSTOMY EXCHANGE LEFT  11/03/2018   IR NEPHROSTOMY PLACEMENT LEFT   10/29/2018   NSVD     x3   TUBAL LIGATION      Allergies: Patient has no known allergies.  Medications: Prior to Admission medications   Medication Sig Start Date End Date Taking? Authorizing Provider  cephALEXin (KEFLEX) 500 MG capsule Take 1 capsule (500 mg total) by mouth 4 (four) times daily for 10 days. 11/09/18 11/19/18  Georgette Shell, MD  cyclobenzaprine (FLEXERIL) 5 MG tablet Take 1 tablet (5 mg total) by mouth 3 (three) times daily as needed for muscle spasms. 11/09/18   Georgette Shell, MD  dexlansoprazole (DEXILANT) 60 MG capsule TAKE 1 CAPSULE BY MOUTH DAILY BEFORE BREAKFAST. Patient taking differently: Take 60 mg by mouth daily before breakfast.  08/01/18   Nandigam, Venia Minks, MD  fluticasone (FLONASE) 50 MCG/ACT nasal spray Place 2 sprays into both nostrils daily. Patient taking differently: Place 2 sprays into both nostrils daily as needed for allergies.  11/08/16   Rogue Bussing, MD  hydrOXYzine (ATARAX/VISTARIL) 10 MG tablet Take 1 tablet (10 mg total) by mouth 3 (three) times daily as needed for itching, anxiety, nausea or vomiting. 11/09/18   Georgette Shell, MD  ondansetron (ZOFRAN) 8 MG tablet Take 1 tablet (8 mg total) by mouth every 6 (six) hours as needed for nausea or vomiting. 11/09/18   Georgette Shell, MD  oxyCODONE-acetaminophen (PERCOCET) 5-325 MG tablet Take  1 tablet by mouth every 4 (four) hours as needed for severe pain. 11/09/18 11/09/19  Georgette Shell, MD  polyethylene glycol (MIRALAX / GLYCOLAX) 17 g packet Take 17 g by mouth daily. 11/10/18   Georgette Shell, MD  senna-docusate (SENOKOT-S) 8.6-50 MG tablet Take 2 tablets by mouth at bedtime. 11/09/18   Georgette Shell, MD  tamsulosin (FLOMAX) 0.4 MG CAPS capsule Take 1 capsule (0.4 mg total) by mouth daily. 10/27/18 11/26/18  Alveria Apley, PA-C  vitamin C (ASCORBIC ACID) 500 MG tablet Take 500 mg by mouth daily as needed (immune system boost/ cold symptoms).      [provider]     Family History  Problem Relation Age of Onset   Cancer Maternal Aunt    Ovarian cancer Maternal Aunt    Cancer Paternal Grandmother    Pancreatic cancer Paternal Grandmother    Hyperlipidemia Mother    Diabetes Mother    Hyperlipidemia Father    Breast cancer Maternal Aunt    Colon cancer Neg Hx    Esophageal cancer Neg Hx    Stomach cancer Neg Hx    Rectal cancer Neg Hx    Colon polyps Neg Hx     Social History   Socioeconomic History   Marital status: Married    Spouse name: Not on file   Number of children: 3   Years of education: Not on file   Highest education level: Not on file  Occupational History   Not on file  Social Needs   Financial resource strain: Not on file   Food insecurity    Worry: Not on file    Inability: Not on file   Transportation needs    Medical: Not on file    Non-medical: Not on file  Tobacco Use   Smoking status: Never Smoker   Smokeless tobacco: Never Used  Substance and Sexual Activity   Alcohol use: Yes    Alcohol/week: 1.0 standard drinks    Types: 1 Glasses of wine per week    Frequency: Never    Comment: ocassional   Drug use: Never   Sexual activity: Yes  Lifestyle   Physical activity    Days per week: Not on file    Minutes per session: Not on file   Stress: Not on file  Relationships   Social connections    Talks on phone: Not on file    Gets together: Not on file    Attends religious service: Not on file    Active member of club or organization: Not on file    Attends meetings of clubs or organizations: Not on file    Relationship status: Not on file  Other Topics Concern   Not on file  Social History Narrative   Not on file     Review of Systems: A 12 point ROS discussed and pertinent positives are indicated in the HPI above.  All other systems are negative.  Review of Systems  Vital Signs: LMP 06/05/2018   Physical Exam Awake and  alert Sites look good About 10 mL of thin milky drainage in the bulb which patient states has been present since Monday. Drain flushes easily. Nephrostomy tube site looks good. Clear urine in gravity bag.     Imaging: Ct Abdomen Pelvis Wo Contrast  Result Date: 11/03/2018 CLINICAL DATA:  Complicated pyelonephritis. Left nephrostomy tube in place draining blood tinged fluid. EXAM: CT ABDOMEN AND PELVIS WITHOUT CONTRAST TECHNIQUE: Multidetector CT imaging of  the abdomen and pelvis was performed following the standard protocol without IV contrast. COMPARISON:  CT 10/30/2018, 10/29/2018, additional priors. FINDINGS: Lower chest: Small bilateral pleural effusions, left greater than right, slight increase from prior exam. Associated atelectasis. Hepatobiliary: Hepatic steatosis. Gallbladder physiologically distended, no calcified stone. No biliary dilatation. Pancreas: No ductal dilatation or inflammation. Spleen: Normal in size without focal abnormality. Adrenals/Urinary Tract: Normal adrenal glands. Left nephrostomy tube unchanged position with tip in the renal pelvis. No abnormality along the course of the nephrostomy tubing. Left hydronephrosis has near completely resolved. Persistent and slight worsening left perinephric edema. Questionable developing low-density region in the inferior left kidney, series 3, image 37. There is persistent left periureteric edema with 5 mm stone at the left ureterovesicular junction. Urinary bladder physiologically distended. No bladder stone. No right hydronephrosis or urolithiasis. Stomach/Bowel: Stomach is within normal limits. Appendix appears normal. No evidence of bowel wall thickening, distention, or inflammatory changes. Administered enteric contrast reaches the distal colon. Vascular/Lymphatic: Abdominal aorta is normal in caliber. Probable small left retroperitoneal nodes not well assessed. No bulky abdominopelvic adenopathy. Reproductive: 2.8 cm cyst in the right  ovary is unchanged in size, probable fluid level suggesting hemorrhagic cyst. Left ovary is unremarkable. Post hysterectomy. Other: Mild generalized body wall edema. No free fluid or free air in the abdomen or pelvis. Musculoskeletal: There are no acute or suspicious osseous abnormalities. IMPRESSION: 1. Unchanged position of left nephrostomy tube with near complete resolution of left hydronephrosis. Persistent left periureteric edema with 5 mm stone at the left ureterovesicular junction. 2. Questionable developing low-density phlegmon/abscess in the inferior left kidney, poorly defined in the absence of IV contrast. Left perinephric edema has increased. 3. Small bilateral pleural effusions, left greater than right, slight increase in size from prior exam. 4. Hepatic steatosis. Electronically Signed   By: Keith Rake M.D.   On: 11/03/2018 00:12   Ct Abdomen Pelvis Wo Contrast  Result Date: 10/30/2018 CLINICAL DATA:  Flank pain. Pain after left percutaneous nephrostomy tube placement 1 flushing catheter. EXAM: CT ABDOMEN AND PELVIS WITHOUT CONTRAST TECHNIQUE: Multidetector CT imaging of the abdomen and pelvis was performed following the standard protocol without IV contrast. COMPARISON:  CT yesterday. FINDINGS: Lower chest: Left pleural effusion with adjacent atelectasis, minimally increased from prior. Trace right pleural effusion is new. Hepatobiliary: No focal hepatic abnormality on noncontrast exam. Mild steatosis. Prominent size liver spanning 22 cm cranial caudal. Gallbladder physiologically distended, no calcified stone. No biliary dilatation. Pancreas: No ductal dilatation or inflammation. Spleen: Normal in size without focal abnormality. Adrenals/Urinary Tract: Normal adrenal glands. Percutaneous left nephrostomy tube with pigtail in the renal pelvis. No fluid collection or abnormality along the course of the tubing. Persistent but slight decreased left hydronephrosis from prior exam. Persistent  left hydroureter and periureteric stranding with obstructing 5 mm stone in the distal ureter. The stone is not significantly changed in position from prior exam. No evidence of perinephric or intra nephric fluid collection. No right hydronephrosis. No right urolithiasis. Right ureter is decompressed. Urinary bladder is minimally distended. No bladder stone. Stomach/Bowel: Stomach is within normal limits. Appendix appears normal. No evidence of bowel wall thickening, distention, or inflammatory changes. Vascular/Lymphatic: Abdominal aorta is normal in caliber. No bulky abdominopelvic adenopathy. Reproductive: Unchanged from prior. Similar 3 cm right ovarian cyst. Prior hysterectomy. Other: Trace free fluid in the pelvis. No free air. No evidence of intra-abdominal abscess. Musculoskeletal: There are no acute or suspicious osseous abnormalities. IMPRESSION: 1. Percutaneous left nephrostomy tube with pigtail in the  renal pelvis. No evidence of complication. Decreased hydronephrosis from prior exam. Unchanged obstructing 5 mm stone in the distal left ureter. 2. Left pleural effusion with adjacent atelectasis, minimally increased from prior. Trace right pleural effusion is new. 3. Mild hepatic steatosis and hepatomegaly. 4. Unchanged 3 cm right ovarian cyst. Electronically Signed   By: Keith Rake M.D.   On: 10/30/2018 23:39   [place] Abscess Drain  Result Date: 11/07/2018 INDICATION: 48 year old female with a history of renal abscess EXAM: ULTRASOUND GUIDED ABSCESS DRAINAGE MEDICATIONS: The patient is currently admitted to the hospital and receiving intravenous antibiotics. The antibiotics were administered within an appropriate time frame prior to the initiation of the procedure. ANESTHESIA/SEDATION: Fentanyl 100 mcg IV; Versed 2.0 mg IV Moderate Sedation Time:  29 minutes The patient was continuously monitored during the procedure by the interventional radiology nurse under my direct supervision. COMPLICATIONS:  None PROCEDURE: Informed written consent was obtained from the patient after a thorough discussion of the procedural risks, benefits and alternatives. All questions were addressed. Maximal Sterile Barrier Technique was utilized including caps, mask, sterile gowns, sterile gloves, sterile drape, hand hygiene and skin antiseptic. A timeout was performed prior to the initiation of the procedure. Patient position prone position on the ultrasound stretcher. Images of the left flank were performed with images stored and sent to PACs. The patient is prepped and draped in the usual sterile fashion. 1% lidocaine was used for local anesthesia. Using ultrasound guidance, 18 gauge trocar needle was advanced into the abscess of the lower pole left kidney. Once we confirmed needle position the stylet was removed aspiration of purulent material confirmed the position. Modified Seldinger technique was then used to place a 10 [place] drain into the inferior pole. Approximately 6 cc of purulent material aspirated for culture. Catheter was sutured in position and attached to bulb suction. Patient tolerated the procedure well and remained hemodynamically stable throughout. No complications were encountered and no significant blood loss. IMPRESSION: Status post ultrasound-guided drainage of lower pole abscess of the left kidney. Signed, Dulcy Fanny. Dellia Nims, RPVI [facility] Electronically Signed   By: Corrie Mckusick D.O.   On: 11/07/2018 14:28   Ct Abdomen Pelvis W Contrast  Result Date: 11/06/2018 CLINICAL DATA:  Abdominal pain, fever, abscess suspected. History of LEFT percutaneous nephrostomy tube insertion on September 27th. Tube exchanged on October 2nd. EXAM: CT ABDOMEN AND PELVIS WITH CONTRAST TECHNIQUE: Multidetector CT imaging of the abdomen and pelvis was performed using the standard protocol following bolus administration of intravenous contrast. CONTRAST:  127mL  OMNIPAQUE IOHEXOL 300 MG/ML  SOLN COMPARISON:  CT abdomen dated 11/02/2018. FINDINGS: Lower chest: Small patchy consolidations at both lung bases, likely atelectasis. Small bilateral pleural effusions. Hepatobiliary: No focal liver abnormality is seen. No gallstones, gallbladder wall thickening, or biliary dilatation. Pancreas: Unremarkable. No pancreatic ductal dilatation or surrounding inflammatory changes. Spleen: Normal in size without focal abnormality. Adrenals/Urinary Tract: LEFT-sided percutaneous nephrostomy tube in place. Tube appears appropriately positioned, terminating in the LEFT renal pelvis. No hydronephrosis. Complex hypodense collection within the posterior cortex of the lower LEFT kidney, measuring 3.6 x 2.8 x 4.3 cm (transverse by AP by craniocaudal dimensions), abscess versus hematoma. RIGHT kidney appears normal. No ureteral calculi appreciated. Bladder is unremarkable. Stomach/Bowel: No dilated large or small bowel loops. No evidence of bowel wall inflammation seen. Stomach is unremarkable. Vascular/Lymphatic: No significant vascular findings are present. No enlarged abdominal or pelvic lymph nodes. Reproductive: Presumed hysterectomy. Other: No free intraperitoneal air.  Musculoskeletal: No acute or suspicious osseous finding. Ill-defined fluid/edema within the subcutaneous soft tissues of the lower abdomen and pelvis indicating anasarca. IMPRESSION: 1. Complex hypodense collection within the posterior cortex of the lower LEFT kidney, measuring 3.6 x 2.8 x 4.3 cm, possible abscess, alternatively liquified hematoma related to the earlier nephrostomy tube placement. There is no air within the collection to confirm renal cortex abscess. 2. LEFT-sided percutaneous nephrostomy tube appears appropriately positioned, terminating in the LEFT renal pelvis. No hydronephrosis. 3. Small patchy consolidations at both lung bases, likely atelectasis. Small bilateral pleural effusions. 4. Anasarca.  Electronically Signed   By: Franki Cabot M.D.   On: 11/06/2018 19:43   Dg Chest Port 1 View  Result Date: 11/02/2018 CLINICAL DATA:  New onset fevers EXAM: PORTABLE CHEST 1 VIEW COMPARISON:  10/29/2018 FINDINGS: Cardiac shadow is prominent accentuated by the portable technique. Increasing density in the left base is noted likely related to underlying atelectasis. No other focal abnormality is seen. IMPRESSION: Increasing parenchymal opacity in the left base consistent with increasing atelectasis. Electronically Signed   By: Inez Catalina M.D.   On: 11/02/2018 12:31   Dg Chest Port 1 View  Result Date: 10/29/2018 CLINICAL DATA:  Tachycardia.  Left flank and abdominal pain. EXAM: PORTABLE CHEST 1 VIEW COMPARISON:  None. FINDINGS: The heart size and mediastinal contours are within normal limits. Opacity in the left base is identified which may represent atelectasis or infiltrate. The visualized skeletal structures are unremarkable. IMPRESSION: Left base opacity which may represent atelectasis or infiltrate Electronically Signed   By: Kerby Moors M.D.   On: 10/29/2018 20:14   Dg Chest Port 1 View  Result Date: 10/29/2018 CLINICAL DATA:  Pt states diagnosed with renal stones Friday, states pain has gotten worse and she has been febrile at home, unsure how high. C/o L flank and abdominal pain. Pt denies vomiting but endorses nausea. Denies blood in urine. Last pai.*comment was truncated*Sepsis EXAM: PORTABLE CHEST 1 VIEW COMPARISON:  None. FINDINGS: Normal cardiac silhouette. Mild central venous congestion. No focal infiltrate. No pneumothorax. IMPRESSION: Mild central venous congestion. Electronically Signed   By: Suzy Bouchard M.D.   On: 10/29/2018 15:41   Ct Renal Stone Study  Result Date: 10/29/2018 CLINICAL DATA:  LEFT flank pain abdominal pain.  Nausea EXAM: CT ABDOMEN AND PELVIS WITHOUT CONTRAST TECHNIQUE: Multidetector CT imaging of the abdomen and pelvis was performed following the standard  protocol without IV contrast. COMPARISON:  10/27/2018 FINDINGS: Lower chest: Lung bases clear Hepatobiliary: No focal hepatic lesion.  Gallbladder normal. Pancreas: Pancreas normal Spleen: Normal spleen Adrenals/Urinary Tract: Adrenal glands are normal. Mild renal edema, hydronephrosis, and hydroureter of the LEFT kidney again noted. The distal LEFT ureteral calculus has migrated approximately 1 cm compared to exam 10/27/2018. Calculus position at the level the LEFT operator space measuring 4 mm by 6 mm (image 77/3). Calculus is approximately 2 cm from the vesicoureteral junction. No nephrolithiasis.  No RIGHT ureterolithiasis.  No bladder calculi Stomach/Bowel: Stomach, small-bowel, appendix and cecum normal. The colon rectosigmoid colon normal. Vascular/Lymphatic: Abdominal aorta is normal caliber. There is no retroperitoneal or periportal lymphadenopathy. No pelvic lymphadenopathy. Reproductive: Post hysterectomy. Cystic enlargement of the RIGHT ovary to 4.2 x 3.8 cm again noted. Cystic component measures approximately 3.1 cm. Other: No free fluid Musculoskeletal: No aggressive osseous lesion IMPRESSION: 1. Partially obstructing calculus in the distal LEFT ureter has migrated approximately 1 cm towards the vesicoureteral junction. 2. Cystic lesion of the RIGHT ovary. No follow-up necessary in this  age group. This recommendation follows ACR consensus guidelines: White Paper of the ACR Incidental Findings Committee II on Adnexal Findings. J Am Coll Radiol [phone]. Electronically Signed   By: Suzy Bouchard M.D.   On: 10/29/2018 15:37   Ct Renal Stone Study  Result Date: 10/27/2018 CLINICAL DATA:  Left flank and lower abdominal pain with nausea and vomiting. EXAM: CT ABDOMEN AND PELVIS WITHOUT CONTRAST TECHNIQUE: Multidetector CT imaging of the abdomen and pelvis was performed following the standard protocol without IV contrast. COMPARISON:  None. FINDINGS: Lower chest: Mild dependent bibasilar  atelectasis. No pleural or pericardial effusion. Hepatobiliary: No focal liver abnormality is seen. No gallstones, gallbladder wall thickening, or biliary dilatation. Pancreas: Unremarkable. No pancreatic ductal dilatation or surrounding inflammatory changes. Spleen: Normal in size without focal abnormality. Adrenals/Urinary Tract: The adrenal glands appear normal. There is mild to moderate left hydronephrosis with stranding about the left kidney and ureter due to a 0.5 cm distal left ureteral stone. No other urinary tract stones are identified. The kidneys otherwise appear normal. Urinary bladder is decompressed but otherwise unremarkable. Stomach/Bowel: Stomach is within normal limits. Appendix appears normal. No evidence of bowel wall thickening, distention, or inflammatory changes. Vascular/Lymphatic: No significant vascular findings are present. No enlarged abdominal or pelvic lymph nodes. Reproductive: Status post hysterectomy. No adnexal masses. Other: Small fat containing umbilical hernia noted. Stranding in subcutaneous fat anterior low pelvis is likely postoperative. Musculoskeletal: No acute or focal abnormality. IMPRESSION: Mild-to-moderate left hydronephrosis due to a 0.5 cm distal left ureteral stone. No other urinary tract stones are seen. Small fat containing umbilical hernia. Electronically Signed   By: Inge Rise M.D.   On: 10/27/2018 09:24   Ir Nephrostomy Placement Left  Result Date: 10/29/2018 CLINICAL DATA:  Obstructing left ureteral calculus, sepsis and need for emergent percutaneous nephrostomy tube placement. EXAM: 1. ULTRASOUND GUIDANCE FOR PUNCTURE OF THE LEFT RENAL COLLECTING SYSTEM. 2. LEFT PERCUTANEOUS NEPHROSTOMY TUBE PLACEMENT. COMPARISON:  CT of the abdomen and pelvis earlier today. ANESTHESIA/SEDATION: 2.0 mg IV Versed; 100 mcg IV Fentanyl. Total Moderate Sedation Time 22 minutes. The patient's level of consciousness and physiologic status were continuously monitored  during the procedure by Radiology nursing. CONTRAST:  20 mL Omnipaque 300 MEDICATIONS: No additional medications. A scheduled dose of IV vancomycin was infusing during the procedure. FLUOROSCOPY TIME:  1 minutes and 36 seconds.  12.4 mGy. PROCEDURE: The procedure, risks, benefits, and alternatives were explained to the patient. Questions regarding the procedure were encouraged and answered. The patient understands and consents to the procedure. A time-out was performed prior to initiating the procedure. The left flank region was prepped with chlorhexidine in a sterile fashion, and a sterile drape was applied covering the operative field. A sterile gown and sterile gloves were used for the procedure. Local anesthesia was provided with 1% Lidocaine. Ultrasound was used to localize the left kidney. Under direct ultrasound guidance, a 21 gauge needle was advanced into the renal collecting system. Ultrasound image documentation was performed. Aspiration of urine sample was performed followed by contrast injection. A transitional dilator was advanced over a guidewire. Percutaneous tract dilatation was then performed over the guidewire. A 10-French percutaneous nephrostomy tube was then advanced and formed in the collecting system. Catheter position was confirmed by fluoroscopy after contrast injection. The catheter was secured at the skin with a Prolene retention suture and Stat-Lock device. A gravity bag was placed. COMPLICATIONS: None. FINDINGS: Ultrasound demonstrates hydronephrosis. After puncture of the lower pole collecting system, there was return of clear  but foul-smelling urine. 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 After nephrostomy tube placement, the tube could not be completely formed in the renal pelvis due to the duplicated nature of the collecting system and small size of the renal pelvis. There was some acute bleeding after nephrostomy tube placement with bloody urine  return after tube placement. A urine sample was sent for culture analysis. IMPRESSION: Placement of 10 French left percutaneous nephrostomy tube. The tube was attached to gravity bag drainage. Initial urine output is bloody after tube placement. A urine sample was sent for culture analysis. Electronically Signed   By: Aletta Edouard M.D.   On: 10/29/2018 19:23   Ir Nephrostomy Exchange Left  Result Date: 11/04/2018 INDICATION: 48 year old female with a history of prior left-sided percutaneous nephrostomy EXAM: IR EXCHANGE NEPHROSTOMY LEFT COMPARISON:  None. MEDICATIONS: None ANESTHESIA/SEDATION: Fentanyl 75 mcg IV; Versed 1.5 mg IV Moderate Sedation Time:  12 minutes The patient was continuously monitored during the procedure by the interventional radiology nurse under my direct supervision. CONTRAST:  56mL OMNIPAQUE IOHEXOL 300 MG/ML SOLN - administered into the collecting system(s) FLUOROSCOPY TIME:  Fluoroscopy Time: 1 minutes 18 seconds (13 mGy). COMPLICATIONS: None PROCEDURE: Informed written consent was obtained from the patient after a thorough discussion of the procedural risks, benefits and alternatives. All questions were addressed. Maximal Sterile Barrier Technique was utilized including caps, mask, sterile gowns, sterile gloves, sterile drape, hand hygiene and skin antiseptic. A timeout was performed prior to the initiation of the procedure. Patient position prone position on the fluoroscopy table the left flank was prepped and draped in the usual sterile fashion. 1% lidocaine was used for local anesthesia. 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 Small amount of contrast confirmed location. Catheter was sutured in position and attached to gravity drainage. Patient tolerated the procedure well and remained  hemodynamically stable throughout. No complications were encountered and no significant blood loss. IMPRESSION: Status post routine exchange of left-sided percutaneous nephrostomy. Signed, Dulcy Fanny. Dellia Nims, RPVI [facility] Radiology Electronically Signed   By: Corrie Mckusick D.O.   On: 11/04/2018 08:14   Vas [place] Lower Extremity Venous (dvt)  Result Date: 10/31/2018  Lower Venous Study Indications: Elevated d-dimer.  Limitations: Body habitus. Comparison Study: No prior study. Performing Technologist: Maudry Mayhew MHA, RDMS, RVT, RDCS  Examination Guidelines: A complete evaluation includes B-mode imaging, spectral Doppler, color Doppler, and power Doppler as needed of all accessible portions of each vessel. Bilateral testing is considered an integral part of a complete examination. Limited examinations for reoccurring indications may be performed as noted.  +---------+---------------+---------+-----------+----------+--------------+  RIGHT     Compressibility Phasicity Spontaneity Properties Thrombus Aging  +---------+---------------+---------+-----------+----------+--------------+  CFV       Full            Yes       Yes                                    +---------+---------------+---------+-----------+----------+--------------+  SFJ       Full                                                             +---------+---------------+---------+-----------+----------+--------------+  FV Prox   Full                                                             +---------+---------------+---------+-----------+----------+--------------+  FV Mid    Full                                                             +---------+---------------+---------+-----------+----------+--------------+  FV Distal Full                                                             +---------+---------------+---------+-----------+----------+--------------+  PFV       Full                                                              +---------+---------------+---------+-----------+----------+--------------+  POP       Full            Yes       Yes                                    +---------+---------------+---------+-----------+----------+--------------+  PTV       Full                                                             +---------+---------------+---------+-----------+----------+--------------+  PERO      Full                                                             +---------+---------------+---------+-----------+----------+--------------+   +---------+---------------+---------+-----------+----------+--------------+  LEFT      Compressibility Phasicity Spontaneity Properties Thrombus Aging  +---------+---------------+---------+-----------+----------+--------------+  CFV       Full            Yes       Yes                                    +---------+---------------+---------+-----------+----------+--------------+  SFJ       Full                                                             +---------+---------------+---------+-----------+----------+--------------+  FV Prox   Full                                                             +---------+---------------+---------+-----------+----------+--------------+  FV Mid    Full                                                             +---------+---------------+---------+-----------+----------+--------------+  FV Distal Full                                                             +---------+---------------+---------+-----------+----------+--------------+  PFV       Full                                                             +---------+---------------+---------+-----------+----------+--------------+  POP       Full            Yes       Yes                                    +---------+---------------+---------+-----------+----------+--------------+  PTV       Full                                                              +---------+---------------+---------+-----------+----------+--------------+  PERO      Full                                                             +---------+---------------+---------+-----------+----------+--------------+  Summary: Right: There is no evidence of deep vein thrombosis in the lower extremity. No cystic structure found in the popliteal fossa. Left: There is no evidence of deep vein thrombosis in the lower extremity. No cystic structure found in the popliteal fossa.  *See table(s) above for measurements and observations. Electronically signed by Deitra Mayo MD on 10/31/2018 at 4:34:24 PM.    Final     Labs:  CBC: Recent Labs    11/05/18 0710 11/06/18 0957 11/07/18 0850 11/08/18 0331  WBC 11.8* 10.1 10.1 8.1  HGB 9.7* 9.7* 10.4* 10.3*  HCT 28.2* 29.8* 30.6* 31.1*  PLT 361 553* 641* 689*    COAGS: Recent Labs    10/29/18 1443 11/07/18 0850  INR 1.4*  1.2  APTT 29  --     BMP: Recent Labs    11/04/18 0745 11/06/18 0957 11/07/18 0850 11/08/18 0331  NA 138 141 139 140  K 3.5 3.2* 3.4* 3.5  CL 108 109 104 106  CO2 21* 23 24 25   GLUCOSE 106* 111* 110* 96  BUN 5* <5* <5* <5*  CALCIUM 7.9* 7.9* 8.2* 8.4*  CREATININE 0.95 0.85 0.87 0.92  GFRNONAA >60 >60 >60 >60  GFRAA >60 >60 >60 >60    LIVER FUNCTION TESTS: Recent Labs    05/24/18 1413 10/27/18 0750 10/29/18 1443 10/30/18 0017  11/03/18 0614 11/04/18 0745 11/06/18 0957 11/08/18 0331  BILITOT 0.3 0.8 1.2 1.8*  --   --   --   --   --   AST 18 18 25 26   --   --   --   --   --   ALT 13 15 20 18   --   --   --   --   --   ALKPHOS 63 49 71 117  --   --   --   --   --   PROT 7.9 7.2 6.1* 5.2*  --   --   --   --   --   ALBUMIN 3.8 3.8 2.8* 2.1*   < > 1.7* 1.9* 2.1* 2.2*   < > = values in this interval not displayed.    TUMOR MARKERS: No results for input(s): AFPTM, CEA, CA199, CHROMGRNA in the last 8760 hours.  Assessment/Plan:  S/p placement of Left PCN on 10/29/18 and was exchanged on  10/2.  S/p placement of a drain in the lower ole abscess of the left kidney by Dr. Earleen Newport on 11/07/2018.  Imaging reviewed today by Dr. Anselm Pancoast. The abscess is resolving, but it has not completely resolved.  We have instructed her to flush the abscess drain daily with only 5 mL of NS. She does not need to flush the PCN.  She is supposed to have surgery to remove the stone soon.  She is to return here in 2 weeks with repeat CT scan and hopefully we can remove the drain at that time.  Electronically Signed: Murrell Redden PA-C 11/16/2018, 1:03 PM    Please refer to Dr. Moises Blood attestation of this note for management and plan.

## 2018-11-20 ENCOUNTER — Other Ambulatory Visit (HOSPITAL_COMMUNITY)
Admission: RE | Admit: 2018-11-20 | Discharge: 2018-11-20 | Disposition: A | Discharge status: Home / Self Care | Payer: Managed Care, Other (non HMO) | Source: Ambulatory Visit | Attending: Urology | Admitting: Urology

## 2018-11-20 DIAGNOSIS — Z01812 Encounter for preprocedural laboratory examination: Secondary | ICD-10-CM | POA: Diagnosis present

## 2018-11-20 DIAGNOSIS — Z20828 Contact with and (suspected) exposure to other viral communicable diseases: Secondary | ICD-10-CM | POA: Diagnosis not present

## 2018-11-21 LAB — NOVEL CORONAVIRUS, NAA (HOSP ORDER, SEND-OUT TO REF LAB; TAT 18-24 HRS): SARS-CoV-2, NAA: NOT DETECTED

## 2018-11-22 ENCOUNTER — Encounter (HOSPITAL_BASED_OUTPATIENT_CLINIC_OR_DEPARTMENT_OTHER): Payer: Self-pay | Admitting: *Deleted

## 2018-11-22 ENCOUNTER — Other Ambulatory Visit: Payer: Self-pay

## 2018-11-22 NOTE — Progress Notes (Signed)
Spoke w/ via phone for pre-op interview--- PT Lab needs dos---- Istat 8              Lab results------ EKG 10-30-2018 epic COVID test ------ 11-20-2018  Arrive at ------- 1030 NPO after ------ [place] Medications to take morning of surgery ----- Dexilant and if needed take oxycodone/ zofran w/ sips of water Diabetic medication ----- n/a  Patient Special Instructions ----- n/a Pre-Op special Istructions -----  Pt has presence of left nephrostomy tube and left intrarenal JP drain.  Patient verbalized understanding of instructions that were given at this phone interview. Patient denies shortness of breath, chest pain, fever, cough a this phone interview.

## 2018-11-22 NOTE — Anesthesia Preprocedure Evaluation (Addendum)
Anesthesia Evaluation  Patient identified by MRN, date of birth, ID band Patient awake    Reviewed: Allergy & Precautions, NPO status , Patient's Chart, lab work & pertinent test results  Airway Mallampati: II  TM Distance: >3 FB Neck ROM: Full    Dental no notable dental hx. (+) Teeth Intact, Dental Advisory Given   Pulmonary neg pulmonary ROS,    Pulmonary exam normal breath sounds clear to auscultation       Cardiovascular Exercise Tolerance: Good Normal cardiovascular exam Rhythm:Regular Rate:Normal     Neuro/Psych Anxiety negative neurological ROS  negative psych ROS   GI/Hepatic Neg liver ROS, GERD  ,  Endo/Other    Renal/GU K+ 3.5 Cr 0.92  L ureteral calculi     Musculoskeletal negative musculoskeletal ROS (+)   Abdominal (+) + obese,   Peds  Hematology  (+) anemia , Hgb 10.3 plt 689   Anesthesia Other Findings   Reproductive/Obstetrics negative OB ROS                            Anesthesia Physical Anesthesia Plan  ASA: II  Anesthesia Plan: General   Post-op Pain Management:    Induction:   PONV Risk Score and Plan: 3 and Treatment may vary due to age or medical condition, Dexamethasone and Ondansetron  Airway Management Planned: LMA  Additional Equipment: None  Intra-op Plan:   Post-operative Plan:   Informed Consent: I have reviewed the patients History and Physical, chart, labs and discussed the procedure including the risks, benefits and alternatives for the proposed anesthesia with the patient or authorized representative who has indicated his/her understanding and acceptance.     Dental advisory given  Plan Discussed with:   Anesthesia Plan Comments:        Anesthesia Quick Evaluation

## 2018-11-23 ENCOUNTER — Ambulatory Visit (HOSPITAL_BASED_OUTPATIENT_CLINIC_OR_DEPARTMENT_OTHER)
Admission: RE | Admit: 2018-11-23 | Discharge: 2018-11-23 | Disposition: A | Discharge status: Home / Self Care | Payer: Managed Care, Other (non HMO) | Attending: Urology | Admitting: Urology

## 2018-11-23 ENCOUNTER — Encounter (HOSPITAL_BASED_OUTPATIENT_CLINIC_OR_DEPARTMENT_OTHER)
Admission: RE | Disposition: A | Discharge status: Home / Self Care | Payer: Self-pay | Source: Home / Self Care | Attending: Urology

## 2018-11-23 ENCOUNTER — Ambulatory Visit (HOSPITAL_BASED_OUTPATIENT_CLINIC_OR_DEPARTMENT_OTHER): Payer: Managed Care, Other (non HMO) | Admitting: Anesthesiology

## 2018-11-23 ENCOUNTER — Encounter (HOSPITAL_BASED_OUTPATIENT_CLINIC_OR_DEPARTMENT_OTHER): Payer: Self-pay | Admitting: *Deleted

## 2018-11-23 ENCOUNTER — Other Ambulatory Visit: Payer: Self-pay

## 2018-11-23 DIAGNOSIS — N12 Tubulo-interstitial nephritis, not specified as acute or chronic: Secondary | ICD-10-CM | POA: Insufficient documentation

## 2018-11-23 DIAGNOSIS — E669 Obesity, unspecified: Secondary | ICD-10-CM | POA: Diagnosis not present

## 2018-11-23 DIAGNOSIS — F419 Anxiety disorder, unspecified: Secondary | ICD-10-CM | POA: Diagnosis not present

## 2018-11-23 DIAGNOSIS — A419 Sepsis, unspecified organism: Secondary | ICD-10-CM | POA: Insufficient documentation

## 2018-11-23 DIAGNOSIS — K219 Gastro-esophageal reflux disease without esophagitis: Secondary | ICD-10-CM | POA: Diagnosis not present

## 2018-11-23 DIAGNOSIS — N201 Calculus of ureter: Secondary | ICD-10-CM | POA: Diagnosis present

## 2018-11-23 HISTORY — DX: Other seasonal allergic rhinitis: J30.2

## 2018-11-23 HISTORY — DX: Iron deficiency anemia, unspecified: D50.9

## 2018-11-23 HISTORY — DX: Personal history of other infectious and parasitic diseases: Z86.19

## 2018-11-23 HISTORY — DX: Frequency of micturition: R35.0

## 2018-11-23 HISTORY — DX: Calculus of ureter: N20.1

## 2018-11-23 HISTORY — PX: CYSTOSCOPY WITH RETROGRADE PYELOGRAM, URETEROSCOPY AND STENT PLACEMENT: SHX5789

## 2018-11-23 HISTORY — DX: Other constipation: K59.09

## 2018-11-23 LAB — POCT I-STAT, CHEM 8
BUN: 11 mg/dL (ref 6–20)
Calcium, Ion: 1.3 mmol/L (ref 1.15–1.40)
Chloride: 103 mmol/L (ref 98–111)
Creatinine, Ser: 0.9 mg/dL (ref 0.44–1.00)
Glucose, Bld: 97 mg/dL (ref 70–99)
HCT: 37 % (ref 36.0–46.0)
Hemoglobin: 12.6 g/dL (ref 12.0–15.0)
Potassium: 3.7 mmol/L (ref 3.5–5.1)
Sodium: 139 mmol/L (ref 135–145)
TCO2: 25 mmol/L (ref 22–32)

## 2018-11-23 SURGERY — CYSTOURETEROSCOPY, WITH RETROGRADE PYELOGRAM AND STENT INSERTION
Anesthesia: General | Site: Ureter | Laterality: Left

## 2018-11-23 MED ORDER — LIDOCAINE HCL 1 % IJ SOLN
INTRAMUSCULAR | Status: DC | PRN
  Administered 2018-11-23: 60 mg via INTRADERMAL
  Administered 2018-11-23: 40 mg via INTRADERMAL

## 2018-11-23 MED ORDER — FENTANYL CITRATE (PF) 100 MCG/2ML IJ SOLN
INTRAMUSCULAR | Status: DC | PRN
  Administered 2018-11-23 (×2): 50 ug via INTRAVENOUS

## 2018-11-23 MED ORDER — ONDANSETRON HCL 4 MG/2ML IJ SOLN
INTRAMUSCULAR | Status: DC | PRN
  Administered 2018-11-23: 4 mg via INTRAVENOUS

## 2018-11-23 MED ORDER — ACETAMINOPHEN 500 MG PO TABS
1000.0000 mg | ORAL_TABLET | Freq: Once | ORAL | Status: AC
  Administered 2018-11-23: 1000 mg via ORAL
  Filled 2018-11-23: qty 2

## 2018-11-23 MED ORDER — FENTANYL CITRATE (PF) 100 MCG/2ML IJ SOLN
INTRAMUSCULAR | Status: AC
  Filled 2018-11-23: qty 2

## 2018-11-23 MED ORDER — LIDOCAINE 2% (20 MG/ML) 5 ML SYRINGE
INTRAMUSCULAR | Status: AC
  Filled 2018-11-23: qty 5

## 2018-11-23 MED ORDER — IOHEXOL 300 MG/ML  SOLN
INTRAMUSCULAR | Status: DC | PRN
  Administered 2018-11-23: 7 mL

## 2018-11-23 MED ORDER — ACETAMINOPHEN 500 MG PO TABS
ORAL_TABLET | ORAL | Status: AC
  Filled 2018-11-23: qty 2

## 2018-11-23 MED ORDER — SODIUM CHLORIDE 0.9 % IV SOLN
INTRAVENOUS | Status: DC
  Administered 2018-11-23: 11:00:00 via INTRAVENOUS
  Filled 2018-11-23: qty 1000

## 2018-11-23 MED ORDER — PROPOFOL 10 MG/ML IV BOLUS
INTRAVENOUS | Status: AC
  Filled 2018-11-23: qty 20

## 2018-11-23 MED ORDER — OXYCODONE-ACETAMINOPHEN 5-325 MG PO TABS: 1.0000 | ORAL_TABLET | ORAL | 0 refills | Status: DC | PRN

## 2018-11-23 MED ORDER — SODIUM CHLORIDE 0.9 % IV SOLN
2.0000 g | INTRAVENOUS | Status: AC
  Administered 2018-11-23: 13:00:00 2 g via INTRAVENOUS
  Filled 2018-11-23: qty 20

## 2018-11-23 MED ORDER — CEFTRIAXONE SODIUM 2 G IJ SOLR
INTRAMUSCULAR | Status: AC
  Filled 2018-11-23: qty 20

## 2018-11-23 MED ORDER — SODIUM CHLORIDE 0.9 % IV SOLN
INTRAVENOUS | Status: AC
  Filled 2018-11-23: qty 100

## 2018-11-23 MED ORDER — DEXAMETHASONE SODIUM PHOSPHATE 4 MG/ML IJ SOLN
INTRAMUSCULAR | Status: DC | PRN
  Administered 2018-11-23: 4 mg via INTRAVENOUS

## 2018-11-23 MED ORDER — SODIUM CHLORIDE 0.9 % IR SOLN
Status: DC | PRN
  Administered 2018-11-23: 3000 mL

## 2018-11-23 MED ORDER — MIDAZOLAM HCL 5 MG/5ML IJ SOLN
INTRAMUSCULAR | Status: DC | PRN
  Administered 2018-11-23: 2 mg via INTRAVENOUS

## 2018-11-23 MED ORDER — DEXAMETHASONE SODIUM PHOSPHATE 10 MG/ML IJ SOLN
INTRAMUSCULAR | Status: AC
  Filled 2018-11-23: qty 1

## 2018-11-23 MED ORDER — PROPOFOL 10 MG/ML IV BOLUS
INTRAVENOUS | Status: DC | PRN
  Administered 2018-11-23: 180 mg via INTRAVENOUS

## 2018-11-23 MED ORDER — MIDAZOLAM HCL 2 MG/2ML IJ SOLN
INTRAMUSCULAR | Status: AC
  Filled 2018-11-23: qty 2

## 2018-11-23 SURGICAL SUPPLY — 28 items
BAG DRAIN URO-CYSTO SKYTR STRL (DRAIN) ×4 IMPLANT
BAG DRN UROCATH (DRAIN) ×2
BASKET STONE 1.7 NGAGE (UROLOGICAL SUPPLIES) IMPLANT
CATH INTERMIT  6FR 70CM (CATHETERS) ×4 IMPLANT
CLOTH BEACON ORANGE TIMEOUT ST (SAFETY) ×4 IMPLANT
EVACUATOR MICROVAS BLADDER (UROLOGICAL SUPPLIES) IMPLANT
EXTRACTOR STONE 1.7FRX115CM (UROLOGICAL SUPPLIES) IMPLANT
FIBER LASER FLEXIVA 1000 (UROLOGICAL SUPPLIES) IMPLANT
FIBER LASER FLEXIVA 200 (UROLOGICAL SUPPLIES) IMPLANT
FIBER LASER FLEXIVA 365 (UROLOGICAL SUPPLIES) IMPLANT
FIBER LASER FLEXIVA 550 (UROLOGICAL SUPPLIES) IMPLANT
FIBER LASER TRAC TIP (UROLOGICAL SUPPLIES) IMPLANT
GLOVE BIO SURGEON STRL SZ8 (GLOVE) ×4 IMPLANT
GOWN STRL REUS W/TWL XL LVL3 (GOWN DISPOSABLE) ×4 IMPLANT
GUIDEWIRE STR DUAL SENSOR (WIRE) IMPLANT
GUIDEWIRE ZIPWRE .038 STRAIGHT (WIRE) ×4 IMPLANT
IV NS 1000ML (IV SOLUTION) ×4
IV NS 1000ML BAXH (IV SOLUTION) ×2 IMPLANT
IV NS IRRIG 3000ML ARTHROMATIC (IV SOLUTION) ×4 IMPLANT
KIT TURNOVER CYSTO (KITS) ×4 IMPLANT
MANIFOLD NEPTUNE II (INSTRUMENTS) ×4 IMPLANT
NS IRRIG 500ML POUR BTL (IV SOLUTION) IMPLANT
PACK CYSTO (CUSTOM PROCEDURE TRAY) ×4 IMPLANT
STENT URET 6FRX24 CONTOUR (STENTS) ×4 IMPLANT
SYR 10ML LL (SYRINGE) ×4 IMPLANT
TUBE CONNECTING 12'X1/4 (SUCTIONS) ×1
TUBE CONNECTING 12X1/4 (SUCTIONS) ×3 IMPLANT
TUBING UROLOGY SET (TUBING) ×4 IMPLANT

## 2018-11-23 NOTE — Discharge Instructions (Signed)
Ureteral Stent Implantation, Care After °This sheet gives you information about how to care for yourself after your procedure. Your health care provider may also give you more specific instructions. If you have problems or questions, contact your health care provider. °What can I expect after the procedure? °After the procedure, it is common to have: °· Nausea. °· Mild pain when you urinate. You may feel this pain in your lower back or lower abdomen. The pain should stop within a few minutes after you urinate. This may last for up to 1 week. °· A small amount of blood in your urine for several days. °Follow these instructions at home: °Medicines °· Take over-the-counter and prescription medicines only as told by your health care provider. °· If you were prescribed an antibiotic medicine, take it as told by your health care provider. Do not stop taking the antibiotic even if you start to feel better. °· Do not drive for 24 hours if you were given a sedative during your procedure. °· Ask your health care provider if the medicine prescribed to you requires you to avoid driving or using heavy machinery. °Activity °· Rest as told by your health care provider. °· Avoid sitting for a long time without moving. Get up to take short walks every 1-2 hours. This is important to improve blood flow and breathing. Ask for help if you feel weak or unsteady. °· Return to your normal activities as told by your health care provider. Ask your health care provider what activities are safe for you. °General instructions ° °· Watch for any blood in your urine. Call your health care provider if the amount of blood in your urine increases. °· If you have a catheter: °? Follow instructions from your health care provider about taking care of your catheter and collection bag. °? Do not take baths, swim, or use a hot tub until your health care provider approves. Ask your health care provider if you may take showers. You may only be allowed to  take sponge baths. °· Drink enough fluid to keep your urine pale yellow. °· Do not use any products that contain nicotine or tobacco, such as cigarettes, e-cigarettes, and chewing tobacco. These can delay healing after surgery. If you need help quitting, ask your health care provider. °· Keep all follow-up visits as told by your health care provider. This is important. °Contact a health care provider if: °· You have pain that gets worse or does not get better with medicine, especially pain when you urinate. °· You have difficulty urinating. °· You feel nauseous or you vomit repeatedly during a period of more than 2 days after the procedure. °Get help right away if: °· Your urine is dark red or has blood clots in it. °· You are leaking urine (have incontinence). °· The end of the stent comes out of your urethra. °· You cannot urinate. °· You have sudden, sharp, or severe pain in your abdomen or lower back. °· You have a fever. °· You have swelling or pain in your legs. °· You have difficulty breathing. °Summary °· After the procedure, it is common to have mild pain when you urinate that goes away within a few minutes after you urinate. This may last for up to 1 week. °· Watch for any blood in your urine. Call your health care provider if the amount of blood in your urine increases. °· Take over-the-counter and prescription medicines only as told by your health care provider. °· Drink   enough fluid to keep your urine pale yellow. This information is not intended to replace advice given to you by your health care provider. Make sure you discuss any questions you have with your health care provider. Document Released: 09/20/2012 Document Revised: 10/25/2017 Document Reviewed: 10/26/2017 Elsevier Patient Education  2020 [place] STENT IN 72 HOURS BY GENTLY PULLING THE STRING   Post Anesthesia Home Care Instructions  Activity: Get plenty of rest for the remainder of the day. A  responsible individual must stay with you for 24 hours following the procedure.  For the next 24 hours, DO NOT: -Drive a car -Paediatric nurse -Drink alcoholic beverages -Take any medication unless instructed by your physician -Make any legal decisions or sign important papers.  Meals: Start with liquid foods such as gelatin or soup. Progress to regular foods as tolerated. Avoid greasy, spicy, heavy foods. If nausea and/or vomiting occur, drink only clear liquids until the nausea and/or vomiting subsides. Call your physician if vomiting continues.  Special Instructions/Symptoms: Your throat may feel dry or sore from the anesthesia or the breathing tube placed in your throat during surgery. If this causes discomfort, gargle with warm salt water. The discomfort should disappear within 24 hours.  If you had a scopolamine patch placed behind your ear for the management of post- operative nausea and/or vomiting:  1. The medication in the patch is effective for 72 hours, after which it should be removed.  Wrap patch in a tissue and discard in the trash. Wash hands thoroughly with soap and water. 2. You may remove the patch earlier than 72 hours if you experience unpleasant side effects which may include dry mouth, dizziness or visual disturbances. 3. Avoid touching the patch. Wash your hands with soap and water after contact with the patch.

## 2018-11-23 NOTE — Transfer of Care (Signed)
Immediate Anesthesia Transfer of Care Note  Patient: Tamara Watson  Procedure(s) Performed: CYSTOSCOPY WITH RETROGRADE PYELOGRAM, URETEROSCOPY AND STENT PLACEMENT (Left ) HOLMIUM LASER APPLICATION (Left )  Patient Location: PACU  Anesthesia Type:General  Level of Consciousness: awake, alert , oriented and patient cooperative  Airway & Oxygen Therapy: Patient Spontanous Breathing and Patient connected to nasal cannula oxygen  Post-op Assessment: Report given to RN and Post -op Vital signs reviewed and stable  Post vital signs: Reviewed and stable  Last Vitals:  Vitals Value Taken Time  BP    Temp    Pulse 110 11/23/18 1315  Resp 15 11/23/18 1315  SpO2 100 % 11/23/18 1315  Vitals shown include unvalidated device data.  Last Pain:  Vitals:   11/23/18 1110  TempSrc: Oral  PainSc: 2       Patients Stated Pain Goal: 6 ([id number])  Complications: No apparent anesthesia complications

## 2018-11-23 NOTE — Anesthesia Procedure Notes (Signed)
Procedure Name: LMA Insertion Date/Time: 11/23/2018 12:39 PM Performed by: Garrel Ridgel, CRNA Pre-anesthesia Checklist: Patient identified, Emergency Drugs available, Suction available, Patient being monitored and Timeout performed Patient Re-evaluated:Patient Re-evaluated prior to induction Oxygen Delivery Method: Circle system utilized Preoxygenation: Pre-oxygenation with 100% oxygen Induction Type: IV induction Ventilation: Mask ventilation without difficulty LMA: LMA inserted LMA Size: 4.0 Placement Confirmation: positive ETCO2 and breath sounds checked- equal and bilateral Tube secured with: Tape Dental Injury: Teeth and Oropharynx as per pre-operative assessment

## 2018-11-23 NOTE — Interval H&P Note (Signed)
History and Physical Interval Note:  11/23/2018 12:11 PM  Tamara Watson  has presented today for surgery, with the diagnosis of LEFT URETERAL CALCULUS.  The various methods of treatment have been discussed with the patient and family. After consideration of risks, benefits and other options for treatment, the patient has consented to  Procedure(s) with comments: CYSTOSCOPY WITH RETROGRADE PYELOGRAM, URETEROSCOPY AND STENT PLACEMENT (Left) - 30 MINS HOLMIUM LASER APPLICATION (Left) as a surgical intervention.  The patient's history has been reviewed, patient examined, no change in status, stable for surgery.  I have reviewed the patient's chart and labs.  Questions were answered to the patient's satisfaction.     Nicolette Bang

## 2018-11-23 NOTE — Brief Op Note (Signed)
11/23/2018  1:29 PM  PATIENT:  Tamara Watson  48 y.o. female  PRE-OPERATIVE DIAGNOSIS:  LEFT URETERAL CALCULUS  POST-OPERATIVE DIAGNOSIS:  * No post-op diagnosis entered *  PROCEDURE:  Procedure(s) with comments: CYSTOSCOPY WITH RETROGRADE PYELOGRAM, URETEROSCOPY AND STENT PLACEMENT (Left) - 30 MINS HOLMIUM LASER APPLICATION (Left)  SURGEON:  Surgeon(s) and Role:    * Evva Din, Candee Furbish, MD - Primary  PHYSICIAN ASSISTANT:   ASSISTANTS: none   ANESTHESIA:   general  EBL:  0 mL   BLOOD ADMINISTERED:none  DRAINS: left 6x24 JJ ureteral stent with tether   LOCAL MEDICATIONS USED:  NONE  SPECIMEN:  No Specimen  DISPOSITION OF SPECIMEN:  N/A  COUNTS:  YES  TOURNIQUET:  * No tourniquets in log *  DICTATION: .Note written in EPIC  PLAN OF CARE: Discharge to home after PACU  PATIENT DISPOSITION:  PACU - hemodynamically stable.   Delay start of Pharmacological VTE agent (>24hrs) due to surgical blood loss or risk of bleeding: not applicable

## 2018-11-24 ENCOUNTER — Other Ambulatory Visit: Payer: Self-pay | Admitting: Urology

## 2018-11-24 ENCOUNTER — Encounter (HOSPITAL_BASED_OUTPATIENT_CLINIC_OR_DEPARTMENT_OTHER): Payer: Self-pay | Admitting: Urology

## 2018-11-24 NOTE — Anesthesia Postprocedure Evaluation (Signed)
Anesthesia Post Note  Patient: Tamara Watson  Procedure(s) Performed: CYSTOSCOPY WITH RETROGRADE PYELOGRAM, URETEROSCOPY AND STENT PLACEMENT: REMOVAL NEPHROSTOMY DRAINS X 2 (Left Ureter)     Patient location during evaluation: PACU Anesthesia Type: General Level of consciousness: awake and alert Pain management: pain level controlled Vital Signs Assessment: post-procedure vital signs reviewed and stable Respiratory status: spontaneous breathing, nonlabored ventilation, respiratory function stable and patient connected to nasal cannula oxygen Cardiovascular status: blood pressure returned to baseline and stable Postop Assessment: no apparent nausea or vomiting Anesthetic complications: no    Last Vitals:  Vitals:   11/23/18 1400 11/23/18 1500  BP: 110/73 123/78  Pulse: 93 100  Resp: 14 14  Temp:  36.9 C  SpO2: 100% 100%    Last Pain:  Vitals:   11/23/18 1445  TempSrc:   PainSc: 0-No pain                 Barnet Glasgow

## 2018-11-28 NOTE — Op Note (Signed)
Preoperative diagnosis: Left ureteral calculus  Postoperative diagnosis: no ureteral calculus visualized  Procedure: 1 cystoscopy 2.  left retrograde pyelography 3.  Intraoperative fluoroscopy, under one hour, with interpretation 4.  Left diagnostic ureteroscopy 5. Left 6x24 JJ ureteral stent placement  Attending: Rosie Fate  Anesthesia: General  Estimated blood loss: None  Drains:left 6x24 JJ ureteral stent placement  Specimens: none  Antibiotics: rocephin  Findings: no ureteral calculi found on ureteroscopy. Left nephrostomy tube removed under fluoscopy.  Indications: Patient is a 48 year old female with a history of left ureteral calculus who underwent nephrostomy tube placement due to sepsis  After discussing treatment options, she decided proceed with leftt diagnostic ureteroscopic stone extraction.  Procedure her in detail: The patient was brought to the operating room and a brief timeout was done to ensure correct patient, correct procedure, correct site.  General anesthesia was administered patient was placed in dorsal lithotomy position.  Her genitalia was then prepped and draped in usual sterile fashion.  A rigid 56 French cystoscope was passed in the urethra and the bladder.  Bladder was inspected free masses or lesions.  the right ureteral orifices were in the normal orthotopic locations.  a 6 french ureteral catheter was then instilled into the right ureter orifice.  a gentle retrograde was obtained and findings noted above.  we then placed a zip wire through the ureteral catheter and advanced up to the renal pelvis.  we then removed the cystoscope and cannulated the right ureteral orifice with a semirigid ureteroscope.  we then performed ureteroscopy up to the level of the UPJ. No stone or tumor was encountered. We then elected to place a ureteral stent on the zip wire. We advanced a 6x24 JJ ureteral stent up to the renal pelvis. The wire was then removed and good coil  was noted in the renal pelvis under fluoroscopy and the bladder under direct vision. We then removed the nephrostomy tube under fluoroscopy.   the bladder was then drained and this concluded the procedure which was well tolerated by patient.  Complications: None  Condition: Stable, extubated, transferred to PACU  Plan: Pt is to followup in 2 weeks

## 2018-11-30 ENCOUNTER — Other Ambulatory Visit: Payer: Managed Care, Other (non HMO)

## 2018-12-27 ENCOUNTER — Telehealth: Payer: Self-pay | Admitting: *Deleted

## 2018-12-27 NOTE — Telephone Encounter (Signed)
Done dexilant prior auth through Cover My Meds today

## 2019-01-15 ENCOUNTER — Telehealth: Payer: Self-pay | Admitting: Gastroenterology

## 2019-01-15 DIAGNOSIS — K219 Gastro-esophageal reflux disease without esophagitis: Secondary | ICD-10-CM

## 2019-01-15 MED ORDER — DEXILANT 60 MG PO CPDR: DELAYED_RELEASE_CAPSULE | ORAL | 3 refills | Status: DC

## 2019-01-15 NOTE — Telephone Encounter (Signed)
Resent new rx for Dexilant done a prior authorization on 11/25 Will have to contact insurance for results

## 2019-01-15 NOTE — Telephone Encounter (Signed)
[facility] called informing that Dexilant needs PA, they will fax PA form and were looking to expediate this for pt who has contacted them several times.

## 2019-01-16 NOTE — Telephone Encounter (Signed)
Answered all questions from insurance and waiting on response   24 hour turn around

## 2019-01-17 NOTE — Telephone Encounter (Signed)
Called pharmacy to inform them med approved, patient has already informed pharmacy to fill medication and that it was approved

## 2019-04-26 ENCOUNTER — Other Ambulatory Visit: Payer: Self-pay | Admitting: Urology

## 2019-05-09 ENCOUNTER — Encounter: Payer: Self-pay | Admitting: Gastroenterology

## 2019-05-09 ENCOUNTER — Ambulatory Visit (INDEPENDENT_AMBULATORY_CARE_PROVIDER_SITE_OTHER): Payer: Managed Care, Other (non HMO) | Admitting: Gastroenterology

## 2019-05-09 ENCOUNTER — Ambulatory Visit: Payer: Managed Care, Other (non HMO) | Admitting: Gastroenterology

## 2019-05-09 VITALS — BP 112/80 | HR 84 | Temp 98.3°F | Ht 66.0 in | Wt 207.0 lb

## 2019-05-09 DIAGNOSIS — K581 Irritable bowel syndrome with constipation: Secondary | ICD-10-CM

## 2019-05-09 DIAGNOSIS — R0982 Postnasal drip: Secondary | ICD-10-CM | POA: Diagnosis not present

## 2019-05-09 DIAGNOSIS — K219 Gastro-esophageal reflux disease without esophagitis: Secondary | ICD-10-CM

## 2019-05-09 DIAGNOSIS — Z1211 Encounter for screening for malignant neoplasm of colon: Secondary | ICD-10-CM

## 2019-05-09 MED ORDER — LINACLOTIDE 145 MCG PO CAPS: 145.0000 ug | ORAL_CAPSULE | Freq: Every day | ORAL | 3 refills | Status: DC

## 2019-05-09 MED ORDER — DEXILANT 60 MG PO CPDR: DELAYED_RELEASE_CAPSULE | ORAL | 11 refills | Status: DC

## 2019-05-09 MED ORDER — SUTAB 1479-225-188 MG PO TABS: 1.0000 | ORAL_TABLET | ORAL | 0 refills | Status: DC

## 2019-05-09 NOTE — Patient Instructions (Signed)
We have sent the following medications to your pharmacy for you to pick up at your convenience: Tamara Watson.  Start over the counter cetrizine daily as needed and Mucinex as needed.   Patient advised to avoid spicy, acidic, citrus, chocolate, mints, fruit and fruit juices.  Limit the intake of caffeine, alcohol and Soda.  Don't exercise too soon after eating.  Don't lie down within 3-4 hours of eating.  Elevate the head of your bed.  Start a lactose free diet x 2 weeks.   You have been scheduled for a colonoscopy. Please follow written instructions given to you at your visit today.  Please pick up your prep supplies at the pharmacy within the next 1-3 days. If you use inhalers (even only as needed), please bring them with you on the day of your procedure.

## 2019-05-09 NOTE — Progress Notes (Signed)
Tamara Watson    [id number]    July 28, 1970  Primary Care Physician:[name], [name] Him, MD  Referring Physician: Haywood Pao, MD [address] [place],  [place] [address]   Chief complaint:  Globus sensation  HPI:  49 year old female with history of chronic GERD and IBS here for follow-up visit  She is taking Dexilant 60 mg daily.  When she does not take she has severe heartburn and reflux symptoms.  She has postnasal drip, worse in the past few weeks.  She wakes up in the night with choking sensation.  She is taking Claritin and Flonase as needed with no improvement  Continues to have intermittent constipation despite Linzess 72 mcg daily, has sensation of incomplete evacuation.  She has to take Senokot as needed once or twice a week.  Abdominal bloating and flatulence worse when she is constipated.  Denies any vomiting, abdominal pain, odynophagia, dysphagia, melena or blood per rectum.  No family history of colon cancer.  EGD July 29, 2017: Normal exam.  Esophageal biopsies consistent with reflux esophagitis and negative for eosinophilic esophagitis.  Outpatient Encounter Medications as of 05/09/2019  Medication Sig  . benzonatate (TESSALON) 100 MG capsule Take by mouth 3 (three) times daily as needed for cough.  . dexlansoprazole (DEXILANT) 60 MG capsule TAKE 1 CAPSULE BY MOUTH DAILY BEFORE BREAKFAST.  . fluticasone (FLONASE) 50 MCG/ACT nasal spray Place 2 sprays into both nostrils daily. (Patient taking differently: Place 2 sprays into both nostrils daily as needed for allergies. )  . senna-docusate (SENOKOT-S) 8.6-50 MG tablet Take 2 tablets by mouth at bedtime. (Patient taking differently: Take 2 tablets by mouth at bedtime as needed. )  . solifenacin (VESICARE) 10 MG tablet TAKE 1 TABLET BY MOUTH EVERY DAY  . vitamin C (ASCORBIC ACID) 500 MG tablet Take 500 mg by mouth daily as needed (immune system boost/ cold symptoms).   . [DISCONTINUED]  cyclobenzaprine (FLEXERIL) 5 MG tablet Take 1 tablet (5 mg total) by mouth 3 (three) times daily as needed for muscle spasms.  . [DISCONTINUED] hydrOXYzine (ATARAX/VISTARIL) 10 MG tablet Take 1 tablet (10 mg total) by mouth 3 (three) times daily as needed for itching, anxiety, nausea or vomiting.  . [DISCONTINUED] oxyCODONE-acetaminophen (PERCOCET) 5-325 MG tablet Take 1 tablet by mouth every 4 (four) hours as needed for moderate pain or severe pain.   No facility-administered encounter medications on file as of 05/09/2019.    Allergies as of 05/09/2019  . (No Known Allergies)    Past Medical History:  Diagnosis Date  . Anxiety    in school only   . Chronic constipation   . Frequency of urination   . GERD (gastroesophageal reflux disease)   . History of sepsis    10-29-2018  sepsis secondary to pyelonephritis  . IDA (iron deficiency anemia)   . Left ureteral stone   . Seasonal allergic rhinitis     Past Surgical History:  Procedure Laterality Date  . BREAST LUMPECTOMY Left 2000 approx.   benign   . CYSTOSCOPY  06/14/2018   Procedure: Cystoscopy;  Surgeon: Waymon Amato, MD;  Location: [facility];  Service: Gynecology;;  . CYSTOSCOPY WITH RETROGRADE PYELOGRAM, URETEROSCOPY AND STENT PLACEMENT Left 11/23/2018   Procedure: CYSTOSCOPY WITH RETROGRADE PYELOGRAM, URETEROSCOPY AND STENT PLACEMENT: REMOVAL NEPHROSTOMY DRAINS X 2;  Surgeon: Cleon Gustin, MD;  Location: [facility];  Service: Urology;  Laterality: Left;  . DILATION AND CURETTAGE OF UTERUS  x2  last one 1997  . HYSTERECTOMY ABDOMINAL WITH SALPINGECTOMY Bilateral 06/14/2018   Procedure: HYSTERECTOMY ABDOMINAL WITH  BILATERAL SALPINGECTOMY;  Surgeon: Waymon Amato, MD;  Location: [facility];  Service: Gynecology;  Laterality: Bilateral;  . IR NEPHROSTOMY EXCHANGE LEFT  11/03/2018  . IR NEPHROSTOMY PLACEMENT LEFT  10/29/2018  . IR RADIOLOGIST EVAL & MGMT  11/16/2018  . TUBAL LIGATION Bilateral 2002   PPTL    Family  History  Problem Relation Age of Onset  . Cancer Maternal Aunt   . Ovarian cancer Maternal Aunt   . Cancer Paternal Grandmother   . Pancreatic cancer Paternal Grandmother   . Hyperlipidemia Mother   . Diabetes Mother   . Hyperlipidemia Father   . Breast cancer Maternal Aunt   . Colon cancer Neg Hx   . Esophageal cancer Neg Hx   . Stomach cancer Neg Hx   . Rectal cancer Neg Hx   . Colon polyps Neg Hx     Social History   Socioeconomic History  . Marital status: Married    Spouse name: Not on file  . Number of children: 3  . Years of education: Not on file  . Highest education level: Not on file  Occupational History  . Not on file  Tobacco Use  . Smoking status: Never Smoker  . Smokeless tobacco: Never Used  Substance and Sexual Activity  . Alcohol use: Yes    Alcohol/week: 1.0 standard drinks    Types: 1 Glasses of wine per week    Comment: ocassional  . Drug use: Never  . Sexual activity: Yes    Birth control/protection: Surgical  Other Topics Concern  . Not on file  Social History Narrative  . Not on file   Social Determinants of Health   Financial Resource Strain:   . Difficulty of Paying Living Expenses:   Food Insecurity:   . Worried About Charity fundraiser in the Last Year:   . Arboriculturist in the Last Year:   Transportation Needs:   . Film/video editor (Medical):   Marland Kitchen Lack of Transportation (Non-Medical):   Physical Activity:   . Days of Exercise per Week:   . Minutes of Exercise per Session:   Stress:   . Feeling of Stress :   Social Connections:   . Frequency of Communication with Friends and Family:   . Frequency of Social Gatherings with Friends and Family:   . Attends Religious Services:   . Active Member of Clubs or Organizations:   . Attends Archivist Meetings:   Marland Kitchen Marital Status:   Intimate Partner Violence:   . Fear of Current or Ex-Partner:   . Emotionally Abused:   Marland Kitchen Physically Abused:   . Sexually Abused:        Review of systems: All other review of systems negative except as mentioned in the HPI.   Physical Exam: Vitals:   05/09/19 1337  BP: 112/80  Pulse: 84  Temp: 98.3 F (36.8 C)   Body mass index is 33.41 kg/m. Gen:      No acute distress HEENT:  EOMI, sclera anicteric Neck:     No masses; no thyromegaly Lungs:    Clear to auscultation bilaterally; normal respiratory effort CV:         Regular rate and rhythm; no murmurs Abd:      + bowel sounds; soft, non-tender; no palpable masses, no distension Ext:    No edema; adequate peripheral perfusion  Skin:      Warm and dry; no rash Neuro: alert and oriented x 3 Psych: normal mood and affect  Data Reviewed:  Reviewed labs, radiology imaging, old records and pertinent past GI work up   Assessment and Plan/Recommendations:  49 year old female with history of chronic GERD, reflux esophagitis and irritable bowel syndrome predominant constipation  GERD: Continue Dexilant 60 mg daily Discussed antireflux measures and lifestyle modifications  Postnasal drip with constant clearing of the throat and globus sensation Advised patient to switch to cetirizine 10 mg daily Use Flonase nasal spray once or twice daily as needed Okay to use Mucinex as needed  IBS predominant constipation: Increase dietary fiber and water intake Increase Linzess dose to 145 mcg daily  Bloating, belching and excessive flatulence: Trial of lactose-free diet for 2 weeks  Colorectal cancer screening average risk, will proceed with colonoscopy given recent change in guidelines recommending colorectal cancer screening starting at age 34  The risks and benefits as well as alternatives of endoscopic procedure(s) have been discussed and reviewed. All questions answered. The patient agrees to proceed.    The patient was provided an opportunity to ask questions and all were answered. The patient agreed with the plan and demonstrated an understanding of the  instructions.  Damaris Hippo , MD    CC: [name], [name] Him, MD

## 2019-05-21 ENCOUNTER — Other Ambulatory Visit: Payer: Self-pay | Admitting: Urology

## 2019-06-07 ENCOUNTER — Encounter: Payer: Self-pay | Admitting: Gastroenterology

## 2019-06-13 ENCOUNTER — Ambulatory Visit (AMBULATORY_SURGERY_CENTER): Payer: Managed Care, Other (non HMO) | Admitting: Gastroenterology

## 2019-06-13 ENCOUNTER — Encounter: Payer: Self-pay | Admitting: Gastroenterology

## 2019-06-13 ENCOUNTER — Other Ambulatory Visit: Payer: Self-pay

## 2019-06-13 VITALS — BP 133/85 | HR 73 | Temp 97.8°F | Resp 16 | Ht 66.0 in | Wt 207.0 lb

## 2019-06-13 DIAGNOSIS — Z1211 Encounter for screening for malignant neoplasm of colon: Secondary | ICD-10-CM | POA: Diagnosis not present

## 2019-06-13 DIAGNOSIS — D120 Benign neoplasm of cecum: Secondary | ICD-10-CM

## 2019-06-13 DIAGNOSIS — D122 Benign neoplasm of ascending colon: Secondary | ICD-10-CM

## 2019-06-13 DIAGNOSIS — K635 Polyp of colon: Secondary | ICD-10-CM

## 2019-06-13 MED ORDER — SODIUM CHLORIDE 0.9 % IV SOLN: 500.0000 mL | Freq: Once | INTRAVENOUS | Status: AC

## 2019-06-13 MED ORDER — HYDROCORTISONE ACE-PRAMOXINE 1-1 % EX CREA: 1.0000 "application " | TOPICAL_CREAM | Freq: Two times a day (BID) | CUTANEOUS | 1 refills | Status: DC

## 2019-06-13 NOTE — Progress Notes (Signed)
Temp-LS  V/S-CW

## 2019-06-13 NOTE — Progress Notes (Signed)
pt tolerated well. VSS. awake and to recovery. Report given to RN.  

## 2019-06-13 NOTE — Op Note (Addendum)
[facility] Patient Name: Tamara Watson Procedure Date: 06/13/2019 11:50 AM MRN: [id number] Endoscopist: Mauri Pole , MD Age: 49 Referring MD:  Date of Birth: 1970/09/30 Gender: Female Account #: [id number] Procedure:                Colonoscopy Indications:              Screening for colorectal malignant neoplasm Medicines:                Monitored Anesthesia Care Procedure:                Pre-Anesthesia Assessment:                           - Prior to the procedure, a History and Physical                            was performed, and patient medications and                            allergies were reviewed. The patient's tolerance of                            previous anesthesia was also reviewed. The risks                            and benefits of the procedure and the sedation                            options and risks were discussed with the patient.                            All questions were answered, and informed consent                            was obtained. Prior Anticoagulants: The patient has                            taken no previous anticoagulant or antiplatelet                            agents. ASA Grade Assessment: II - A patient with                            mild systemic disease. After reviewing the risks                            and benefits, the patient was deemed in                            satisfactory condition to undergo the procedure.                           After obtaining informed consent, the colonoscope  was passed under direct vision. Throughout the                            procedure, the patient's blood pressure, pulse, and                            oxygen saturations were monitored continuously. The                            Colonoscope was introduced through the anus and                            advanced to the the cecum, identified by                            appendiceal  orifice and ileocecal valve. The                            colonoscopy was performed without difficulty. The                            patient tolerated the procedure well. The quality                            of the bowel preparation was excellent. The                            ileocecal valve, appendiceal orifice, and rectum                            were photographed. Scope In: 12:00:12 PM Scope Out: 12:22:19 PM Scope Withdrawal Time: 0 hours 16 minutes 33 seconds  Total Procedure Duration: 0 hours 22 minutes 7 seconds  Findings:                 The perianal and digital rectal examinations were                            normal.                           A less than 1 mm polyp was found in the cecum. The                            polyp was sessile. The polyp was removed with a                            cold biopsy forceps. Resection and retrieval were                            complete.                           A 5 mm polyp was found in the ascending colon. The  polyp was flat. The polyp was removed with a cold                            snare. Resection and retrieval were complete.                           A few small-mouthed diverticula were found in the                            sigmoid colon.                           Non-bleeding internal hemorrhoids were found during                            retroflexion. The hemorrhoids were small.                           The exam was otherwise without abnormality. Complications:            No immediate complications. Estimated Blood Loss:     Estimated blood loss was minimal. Impression:               - One less than 1 mm polyp in the cecum, removed                            with a cold biopsy forceps. Resected and retrieved.                           - One 5 mm polyp in the ascending colon, removed                            with a cold snare. Resected and retrieved.                           -  Diverticulosis in the sigmoid colon.                           - Non-bleeding internal hemorrhoids.                           - The examination was otherwise normal. Recommendation:           - Patient has a contact number available for                            emergencies. The signs and symptoms of potential                            delayed complications were discussed with the                            patient. Return to normal activities tomorrow.  Written discharge instructions were provided to the                            patient.                           - Resume previous diet.                           - Continue present medications.                           - Await pathology results.                           - Repeat colonoscopy in 5-10 years for screening                            purposes.                           - Use Benefiber two teaspoons PO BID.                           - Rectoprom cream (pramoxine): Apply externally BID                            PRN X 5-7 days. Mauri Pole, MD 06/13/2019 12:29:23 PM This report has been signed electronically.

## 2019-06-13 NOTE — Progress Notes (Signed)
Called to room to assist during endoscopic procedure.  Patient ID and intended procedure confirmed with present staff. Received instructions for my participation in the procedure from the performing physician.  

## 2019-06-13 NOTE — Patient Instructions (Signed)
YOU HAD AN ENDOSCOPIC PROCEDURE TODAY AT [facility]:   Refer to the procedure report that was given to you for any specific questions about what was found during the examination.  If the procedure report does not answer your questions, please call your gastroenterologist to clarify.  If you requested that your care partner not be given the details of your procedure findings, then the procedure report has been included in a sealed envelope for you to review at your convenience later.  YOU SHOULD EXPECT: Some feelings of bloating in the abdomen. Passage of more gas than usual.  Walking can help get rid of the air that was put into your GI tract during the procedure and reduce the bloating. If you had a lower endoscopy (such as a colonoscopy or flexible sigmoidoscopy) you may notice spotting of blood in your stool or on the toilet paper. If you underwent a bowel prep for your procedure, you may not have a normal bowel movement for a few days.  Please Note:  You might notice some irritation and congestion in your nose or some drainage.  This is from the oxygen used during your procedure.  There is no need for concern and it should clear up in a day or so.  SYMPTOMS TO REPORT IMMEDIATELY:   Following lower endoscopy (colonoscopy or flexible sigmoidoscopy):  Excessive amounts of blood in the stool  Significant tenderness or worsening of abdominal pains  Swelling of the abdomen that is new, acute  Fever of 100F or higher   For urgent or emergent issues, a gastroenterologist can be reached at any hour by calling [phone]. Do not use MyChart messaging for urgent concerns.    DIET:  We do recommend a small meal at first, but then you may proceed to your regular diet.  Drink plenty of fluids but you should avoid alcoholic beverages for 24 hours.  MEDICATIONS: Continue present medications. Use Benefiber two teaspoons by mouth twice daily. Use Rectoprom cream (pramoxine): Apply  externally twice daily as needed for 5-7 days.  Please see handouts given to you by your recovery nurse.  ACTIVITY:  You should plan to take it easy for the rest of today and you should NOT DRIVE or use heavy machinery until tomorrow (because of the sedation medicines used during the test).    FOLLOW UP: Our staff will call the number listed on your records 48-72 hours following your procedure to check on you and address any questions or concerns that you may have regarding the information given to you following your procedure. If we do not reach you, we will leave a message.  We will attempt to reach you two times.  During this call, we will ask if you have developed any symptoms of COVID 19. If you develop any symptoms (ie: fever, flu-like symptoms, shortness of breath, cough etc.) before then, please call [phone].  If you test positive for Covid 19 in the 2 weeks post procedure, please call and report this information to [place].    If any biopsies were taken you will be contacted by phone or by letter within the next 1-3 weeks.  Please call us at [phone] if you have not heard about the biopsies in 3 weeks.   Thank you for allowing [place] to provide for your healthcare needs today.   SIGNATURES/CONFIDENTIALITY: You and/or your care partner have signed paperwork which will be entered into your electronic medical record.  These signatures attest to the fact  that that the information above on your After Visit Summary has been reviewed and is understood.  Full responsibility of the confidentiality of this discharge information lies with you and/or your care-partner.

## 2019-06-15 ENCOUNTER — Telehealth: Payer: Self-pay

## 2019-06-15 NOTE — Telephone Encounter (Signed)
  Follow up Call-  Call back number 06/13/2019 07/29/2017 04/13/2017  Post procedure Call Back phone  # [phone]  Permission to leave phone message Yes Yes Yes  Some recent data might be hidden     Patient questions:  Do you have a fever, pain , or abdominal swelling? No. Pain Score  0 *  Have you tolerated food without any problems? Yes.    Have you been able to return to your normal activities? Yes.    Do you have any questions about your discharge instructions: Diet   No. Medications  No. Follow up visit  No.  Do you have questions or concerns about your Care? No.  Actions: * If pain score is 4 or above: No action needed, pain <4.  1. Have you developed a fever since your procedure? no  2.   Have you had an respiratory symptoms (SOB or cough) since your procedure? no  3.   Have you tested positive for COVID 19 since your procedure no  4.   Have you had any family members/close contacts diagnosed with the COVID 19 since your procedure?  no   If yes to any of these questions please route to Joylene John, RN and Erenest Rasher, RN   Pt states she is having some right neck muscular discomfort since the procedure, but states she feels fine and requires no further care.  Encouraged to take otc pain reliever and use muscle relaxing cream to ease discomfort.  She agreed.

## 2019-06-21 ENCOUNTER — Encounter: Payer: Self-pay | Admitting: Gastroenterology

## 2019-11-28 ENCOUNTER — Other Ambulatory Visit: Payer: Self-pay | Admitting: Gastroenterology

## 2019-12-20 ENCOUNTER — Encounter: Payer: Self-pay | Admitting: Neurology

## 2019-12-24 ENCOUNTER — Telehealth: Payer: Self-pay | Admitting: Neurology

## 2019-12-24 ENCOUNTER — Encounter: Payer: Self-pay | Admitting: Neurology

## 2019-12-24 ENCOUNTER — Other Ambulatory Visit: Payer: Self-pay

## 2019-12-24 ENCOUNTER — Ambulatory Visit (INDEPENDENT_AMBULATORY_CARE_PROVIDER_SITE_OTHER): Payer: Managed Care, Other (non HMO) | Admitting: Neurology

## 2019-12-24 VITALS — BP 120/81 | HR 91 | Ht 65.0 in | Wt 211.0 lb

## 2019-12-24 DIAGNOSIS — R519 Headache, unspecified: Secondary | ICD-10-CM

## 2019-12-24 DIAGNOSIS — Z8619 Personal history of other infectious and parasitic diseases: Secondary | ICD-10-CM | POA: Diagnosis not present

## 2019-12-24 DIAGNOSIS — G43519 Persistent migraine aura without cerebral infarction, intractable, without status migrainosus: Secondary | ICD-10-CM

## 2019-12-24 DIAGNOSIS — T753XXA Motion sickness, initial encounter: Secondary | ICD-10-CM

## 2019-12-24 MED ORDER — PROPRANOLOL HCL 20 MG PO TABS: 20.0000 mg | ORAL_TABLET | Freq: Two times a day (BID) | ORAL | 6 refills | Status: DC

## 2019-12-24 NOTE — Addendum Note (Signed)
Addended by: Larey Seat on: 12/24/2019 10:41 AM   Modules accepted: Orders

## 2019-12-24 NOTE — Progress Notes (Signed)
Provider:  Larey Seat, MD  Primary Care Physician:  Haywood Pao, MD [place] [address]     Referring Provider: Haywood Pao, [place],  Tamara Watson          Chief Complaint according to patient   Patient presents with:    . New Patient (Initial Visit)     continuing headaches > yr. no prior hx of HA's.  she started developing headaches with blurred vision, if she is driving she is ok, if someone else is driving she will develop headache along with nausea, dizziness and sometimes will have to pull over to vomit. she intermittently has light sensitivity. she is having these headaches every other day.      HISTORY OF PRESENT ILLNESS:  Tamara Watson is a 49 y.o. year old African -American female patient seen here in a neurological CONSULTATION ,upon request by  Dr Osborne Casco.   I have the pleasure of seeing Tamara Watson today, a right-handed [place] or [place] American female with a possible sleep disorder. She  has a past medical history of Allergy, Anxiety, Chronic constipation, Chronic kidney disease, Frequency of urination, GERD (gastroesophageal reflux disease), History of sepsis, IBS (irritable colon syndrome), IDA (iron deficiency anemia), Left ureteral stone, and Seasonal allergic rhinitis.  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  She stated that she got sick from September to October 2020 was admitted first with a kidney stone which then formed an abscess and she became septic.  In January she had a lot of throat issues and upper respiratory infection.  She had a chronic cough hacking cough and the feeling that something was stuck.  Since her urosepsis she has had flank pain, discomfort in her legs she cannot sit for long time she feels that she has to stretch and move.   She has developed serious headaches beginning with  her hospitalization and she has also developed an inability to stay asleep.  She actually goes to sleep between 9 - 11 but wakes up between 1 or 2 AM and then tosses and turns for the rest of the night unable to get more sleep.   Headaches are daytime occurrences, yet she wakes up with headaches, not from headaches.   She has been placed on Ubrelvy, a medication for migrainous headaches which she can take once at night and may repeat if needed.  She has been on Linzess for irritable bowel, and on Vesicare.  She is also on meloxicam, vitamin C, senna and cyclobenzaprine 5 mg Flexeril as needed up to 3 times a day for muscle spasms. She has fluticasone nasal spray.   Dr. Domenick Gong signed this visit note on 6--22-2021.    Family medical /sleep history: no other family member with migraine, with OSA, insomnia, sleep walkers.    Social history:  Patient is working as Therapist, sports since 2011 and lives in a household with spouse and children.  The patient currently works day shifts. Tobacco use- none .  ETOH use ;less than  4/ week frequently- red wine,  Caffeine intake in form of Coffee(1-2 cups in AM ). Regular exercise in form of walking ! 2/ week- .      Sleep habits are as follows: The patient's dinner time is between 8.30 PM. The patient goes to bed at 9.30 PM and continues to  sleep for 3-4 hours, wakes with headaches.   Bedroom is cool, but TV is on mute- she wants the light of the screen.  The preferred sleep position is on either side , with the support of 2 pillows.  Dreams are reportedly frequent.  5.30- 5.45  AM is the usual rise time. The patient wakes up with an alarm.  She reports not feeling refreshed or restored in AM, with symptoms such as dry mouth, morning headaches, and residual fatigue. Naps are taken infrequently.    Review of Systems: Out of a complete 14 system review, the patient complains of only the following symptoms, and all other reviewed systems are negative.: she has  reported spells of "aphasia"-names and words escape her.  She also feels that she can't retain information form reading material.  Fatigue, sleepiness , snoring, fragmented sleep, Insomnia- for the second half of the night.    How likely are you to doze in the following situations: 0 = not likely, 1 = slight chance, 2 = moderate chance, 3 = high chance   Sitting and Reading? Watching Television? Sitting inactive in a public place (theater or meeting)? As a passenger in a car for an hour without a break? Lying down in the afternoon when circumstances permit? Sitting and talking to someone? Sitting quietly after lunch without alcohol? In a car, while stopped for a few minutes in traffic?   Total = 6 / 24 points   FSS endorsed at  40/ 63 points.    Headaches in daytime and in AM.   Social History   Socioeconomic History  . Marital status: Married    Spouse name: Not on file  . Number of children: 3  . Years of education: Not on file  . Highest education level: Not on file  Occupational History  . Not on file  Tobacco Use  . Smoking status: Never Smoker  . Smokeless tobacco: Never Used  Vaping Use  . Vaping Use: Never used  Substance and Sexual Activity  . Alcohol use: Yes    Alcohol/week: 1.0 standard drink    Types: 1 Glasses of wine per week    Comment: 2-4 drinks a month  . Drug use: Never  . Sexual activity: Yes    Birth control/protection: Surgical  Other Topics Concern  . Not on file  Social History Narrative  . Not on file   Social Determinants of Health   Financial Resource Strain:   . Difficulty of Paying Living Expenses: Not on file  Food Insecurity:   . Worried About Charity fundraiser in the Last Year: Not on file  . Ran Out of Food in the Last Year: Not on file  Transportation Needs:   . Lack of Transportation (Medical): Not on file  . Lack of Transportation (Non-Medical): Not on file  Physical Activity:   . Days of Exercise per Week: Not on  file  . Minutes of Exercise per Session: Not on file  Stress:   . Feeling of Stress : Not on file  Social Connections:   . Frequency of Communication with Friends and Family: Not on file  . Frequency of Social Gatherings with Friends and Family: Not on file  . Attends Religious Services: Not on file  . Active Member of Clubs or Organizations: Not on file  . Attends Archivist Meetings: Not on file  . Marital Status: Not on file    Family History  Problem Relation Age of Onset  .  Cancer Maternal Aunt   . Ovarian cancer Maternal Aunt   . Cancer Paternal Grandmother   . Pancreatic cancer Paternal Grandmother   . Hyperlipidemia Mother   . Diabetes Mother   . Hyperlipidemia Father   . Breast cancer Maternal Aunt   . Colon cancer Neg Hx   . Esophageal cancer Neg Hx   . Stomach cancer Neg Hx   . Rectal cancer Neg Hx   . Colon polyps Neg Hx     Past Medical History:  Diagnosis Date  . Allergy    seasonal  . Anxiety    in school only   . Chronic constipation   . Chronic kidney disease    kidney stones  . Frequency of urination   . GERD (gastroesophageal reflux disease)   . History of sepsis    10-29-2018  sepsis secondary to pyelonephritis  . IBS (irritable colon syndrome)   . IDA (iron deficiency anemia)   . Left ureteral stone   . Seasonal allergic rhinitis     Past Surgical History:  Procedure Laterality Date  . BREAST LUMPECTOMY Left 2000 approx.   benign   . CYSTOSCOPY  06/14/2018   Procedure: Cystoscopy;  Surgeon: Waymon Amato, MD;  Location: [facility];  Service: Gynecology;;  . CYSTOSCOPY WITH RETROGRADE PYELOGRAM, URETEROSCOPY AND STENT PLACEMENT Left 11/23/2018   Procedure: CYSTOSCOPY WITH RETROGRADE PYELOGRAM, URETEROSCOPY AND STENT PLACEMENT: REMOVAL NEPHROSTOMY DRAINS X 2;  Surgeon: Cleon Gustin, MD;  [facility];  Service: Urology;  Laterality: Left;  . DILATION AND CURETTAGE OF UTERUS  x2  last one 1997  . HYSTERECTOMY  ABDOMINAL WITH SALPINGECTOMY Bilateral 06/14/2018   Procedure: HYSTERECTOMY ABDOMINAL WITH  BILATERAL SALPINGECTOMY;  Surgeon: Waymon Amato, MD;  Location: [facility];  Service: Gynecology;  Laterality: Bilateral;  . IR NEPHROSTOMY EXCHANGE LEFT  11/03/2018  . IR NEPHROSTOMY PLACEMENT LEFT  10/29/2018  . IR RADIOLOGIST EVAL & MGMT  11/16/2018  . TUBAL LIGATION Bilateral 2002   PPTL     Current Outpatient Medications on File Prior to Visit  Medication Sig Dispense Refill  . acetaminophen (TYLENOL) 500 MG tablet Take 1,000 mg by mouth every 6 (six) hours as needed.    . benzonatate (TESSALON) 100 MG capsule Take by mouth 3 (three) times daily as needed for cough.    . cyclobenzaprine (FLEXERIL) 5 MG tablet Take 5 mg by mouth 3 (three) times daily as needed for muscle spasms.    Marland Kitchen dexlansoprazole (DEXILANT) 60 MG capsule TAKE 1 CAPSULE BY MOUTH DAILY BEFORE BREAKFAST. 30 capsule 11  . fluticasone (FLONASE) 50 MCG/ACT nasal spray Place 2 sprays into both nostrils daily. (Patient taking differently: Place 2 sprays into both nostrils daily as needed for allergies. ) 16 g 1  . LINZESS 145 MCG CAPS capsule TAKE 1 CAPSULE (145 MCG TOTAL) BY MOUTH DAILY BEFORE BREAKFAST. 30 capsule 3  . meloxicam (MOBIC) 7.5 MG tablet Take 7.5 mg by mouth 2 (two) times daily as needed.    . pramoxine-hydrocortisone (PROCTOCREAM-HC) 1-1 % rectal cream Place 1 application rectally 2 (two) times daily. 30 g 1  . senna-docusate (SENOKOT-S) 8.6-50 MG tablet Take 2 tablets by mouth at bedtime. (Patient taking differently: Take 2 tablets by mouth at bedtime as needed. )    . solifenacin (VESICARE) 10 MG tablet TAKE 1 TABLET BY MOUTH EVERY DAY 90 tablet 1  . Ubrogepant (UBRELVY) 50 MG TABS Take 1 tablet by mouth as needed. Take 1 tablet at the onset  of a headache, may repeat in 2 hours    . vitamin C (ASCORBIC ACID) 500 MG tablet Take 500 mg by mouth daily as needed (immune system boost/ cold symptoms).      Current  Facility-Administered Medications on File Prior to Visit  Medication Dose Route Frequency Provider Last Rate Last Admin  . 0.9 %  sodium chloride infusion  500 mL Intravenous Once Nandigam, Venia Minks, MD        No Known Allergies  Physical exam:  Today's Vitals   12/24/19 0924  BP: 120/81  Pulse: 91  Weight: 211 lb (95.7 kg)  Height: 5\' 5"  (1.651 m)   Body mass index is 35.11 kg/m.   Wt Readings from Last 3 Encounters:  12/24/19 211 lb (95.7 kg)  06/13/19 207 lb (93.9 kg)  05/09/19 207 lb (93.9 kg)     Ht Readings from Last 3 Encounters:  12/24/19 5\' 5"  (1.651 m)  06/13/19 5\' 6"  (1.676 m)  05/09/19 5\' 6"  (1.676 m)      General: The patient is awake, alert and appears not in acute distress. The patient is well groomed. Head: Normocephalic, atraumatic. Neck is supple. Mallampati  2. Dental status: N/A  Cardiovascular:  Regular rate and cardiac rhythm by pulse,  without distended neck veins. Respiratory: Lungs are clear to auscultation.  Skin:  Without evidence of ankle edema, or rash. Trunk: The patient's posture is erect.   Neurologic exam : The patient is awake and alert, oriented to place and time.   Memory subjective described as intact.  Attention span & concentration ability appears normal.  Speech is fluent,  without  dysarthria, dysphonia - she has reported spells of "aphasia"-names and words escape her.  She also feels that she can't retain information form reading material. Mood and affect are appropriate.   Cranial nerves: no loss of smell or taste reported  Pupils are equal and briskly reactive to light. Funduscopic exam deferred.   Extraocular movements in vertical and horizontal planes were intact and without nystagmus. No Diplopia. Visual fields by finger perimetry are intact. Hearing was intact to soft voice and finger rubbing.    Facial sensation intact to fine touch.  Facial motor strength is symmetric and tongue and uvula move midline.  Neck ROM :  rotation, tilt and flexion extension were normal for age and shoulder shrug was symmetrical.    Motor exam:  Symmetric bulk, tone and ROM.   Normal tone without cog -wheeling, symmetric grip strength .   Sensory:  Fine touch, pinprick and vibration were tested  and  normal.  Proprioception tested in the upper extremities was normal.   Coordination: Rapid alternating movements in the fingers/hands were of normal speed.  The Finger-to-nose maneuver was intact without evidence of ataxia, dysmetria or tremor.   Gait and station: Patient could rise unassisted from a seated position, walked without assistive device.  Stance is of normal width/ base .  Toe and heel walk were deferred.  Deep tendon reflexes: in the  upper and lower extremities are symmetric and intact.  Babinski response was deferred.        After spending a total time of  45 minutes face to face and additional time for physical and neurologic examination, review of laboratory studies,  personal review of imaging studies, reports and results of other testing and review of referral information / records as far as provided in visit, I have established the following assessments:  1) the patient has felt in  impairment in well being ever since hospitalization with SEPSIS in October 2020. She sleeps less well, she has leg and flank discomfort, she has headaches- One she wakes up with ( she is not woken by it ). Dull and goes away as the day goes on.   Another headache feels like an electrical current , creepy crawly- she wears a wig. She reports her eyes get fatigued, then tension is building up all around the head, as if in a vice.  Nausea, photophobia, she has vomited when this happened while she was in a car- getting nauseated, dizzy, motion sickness.     My Plan is to proceed with: In short, Tamara Watson is presenting with a headache condition that began in hospital and has still not ceased to affect her, at a 5-7 out  of 10 intensity, almost daily, and she never had motion sickness before , either- I discontinued UBRELVY as it did not help her.   1) She has not use any preventive , had no imaging study. I will not ask her to try  topamax for the reported wordfinding delays already experienced, but try beta blocker. Propranolol. Not depakote, due to weight.  2) MRI brain ordered, without contrast.  3) HST or PSG for rule out hypoxia, apnea.   I would like to thank [name], [name] Him, MD and Haywood Pao, Md [address] [place],  [place] [address] for allowing me to meet with and to take care of this pleasant patient.    I plan to follow up either personally or through our NP within 3-4  month.  CC: I will share my notes with PCP.   Electronically signed by: Tamara Seat, MD 12/24/2019 9:57 AM  [facility] Board certified by The [employer] of Sleep Medicine and Diplomate of the [employer] of Sleep Medicine. Board certified In Neurology through the [employer], Fellow of the [employer] of Neurology. Medical Director of [facility].

## 2019-12-24 NOTE — Telephone Encounter (Signed)
[employer] order sent to GI. They will obtain the auth and reach out to the patient to schedule.  

## 2019-12-24 NOTE — Patient Instructions (Signed)
Recurrent Migraine Headache  Migraines are a type of headache, and they are usually stronger and more sudden than normal headaches (tension headaches). Migraines are characterized by an intense pulsing, throbbing pain that is usually only present on one side of the head. Sometimes, migraine headaches can cause nausea, vomiting, sensitivity to light and sound, and vision changes. Recurrent migraines keep coming back (recurring). A migraine can last from 4 hours up to 3 days. What are the causes? The exact cause of this condition is not known. However, a migraine may be caused when nerves in the brain become irritated and release chemicals that cause inflammation of blood vessels. This inflammation causes pain. Certain things may also trigger migraines, such as:  A disruption in your regular eating and sleeping schedule.  Smoking.  Stress.  Menstruation.  Certain foods and drinks, such as: ? Aged cheese. ? Chocolate. ? Alcohol. ? Caffeine. ? Foods or drinks that contain nitrates, glutamate, aspartame, MSG, or tyramine.  Lack of sleep.  Hunger.  Physical exertion.  Fatigue.  High altitude.  Weather changes.  Medicines, such as: ? Nitroglycerin, which is used to treat chest pain. ? Birth control pills. ? Estrogen. ? Some blood pressure medicines. What are the signs or symptoms? Symptoms of this condition vary for each person and may include:  Pain that is usually only present on one side of the head. In some cases, the pain may be on both sides of the head or around the head or neck.  Pulsating or throbbing pain.  Severe pain that prevents daily activities.  Pain that is aggravated by any physical activity.  Nausea, vomiting, or both.  Dizziness.  Pain with exposure to bright lights, loud noises, or activity.  General sensitivity to bright lights, loud noises, or smells. Before you get a migraine, you may get warning signs that a migraine is coming (aura). An aura  may include:  Seeing flashing lights.  Seeing bright spots, halos, or zigzag lines.  Having tunnel vision or blurred vision.  Having numbness or a tingling feeling.  Having trouble talking.  Having muscle weakness.  Smelling a certain odor. How is this diagnosed? This condition is often diagnosed based on:  Your symptoms and medical history.  A physical exam. You may also have tests, including:  A CT scan or MRI of your brain. These imaging tests cannot diagnose migraines, but they can help to rule out other causes of headaches.  Blood tests. How is this treated? This condition is treated with:  Medicines. These are used for: ? Lessening pain and nausea. ? Preventing recurrent migraines.  Lifestyle changes, such as changes to your diet or sleeping patterns.  Behavior therapy, such as relaxation training or biofeedback. Biofeedback is a treatment that involves teaching you to relax and use your brain to lower your heart rate and control your breathing. Follow these instructions at home: Medicines  Take over-the-counter and prescription medicines only as told by your health care provider.  Do not drive or use heavy machinery while taking prescription pain medicine. Lifestyle  Do not use any products that contain nicotine or tobacco, such as cigarettes and e-cigarettes. If you need help quitting, ask your health care provider.  Limit alcohol intake to no more than 1 drink a day for nonpregnant women and 2 drinks a day for men. One drink equals 12 oz of beer, 5 oz of wine, or 1 oz of hard liquor.  Get 7-9 hours of sleep each night, or the amount of  sleep recommended by your health care provider.  Limit your stress. Talk with your health care provider if you need help with stress management.  Maintain a healthy weight. If you need help losing weight, ask your health care provider.  Exercise regularly. Aim for 150 minutes of moderate-intensity exercise (walking,  biking, yoga) or 75 minutes of vigorous exercise (running, circuit training, swimming) each week. General instructions   Keep a journal to find out what triggers your migraine headaches so you can avoid these triggers. For example, write down: ? What you eat and drink. ? How much sleep you get. ? Any change to your diet or medicines.  Lie down in a dark, quiet room when you have a migraine.  Try placing a cool towel over your head when you have a migraine.  Keep lights dim, if bright lights bother you and make your migraines worse.  Keep all follow-up visits as told by your health care provider. This is important. Contact a health care provider if:  Your pain does not improve, even with medicine.  Your migraines continue to return, even with medicine.  You have a fever.  You have weight loss. Get help right away if:  Your migraine becomes severe and medicine does not help.  You have a stiff neck.  You have a loss of vision.  You have muscle weakness or loss of muscle control.  You start losing your balance or have trouble walking.  You feel faint or you pass out.  You develop new, severe symptoms.  You start having abrupt severe headaches that last for a second or less, like a thunderclap. Summary  Migraine headaches are usually stronger and more sudden than normal headaches (tension headaches). Migraines are characterized by an intense pulsing, throbbing pain that is usually only present on one side of the head.  The exact cause of this condition is not known. However, a migraine may be caused when nerves in the brain become irritated and release chemicals that cause inflammation of blood vessels.  Certain things may trigger migraines, such as changes to diet or sleeping patterns, smoking, certain foods, alcohol, stress, and certain medicines.  Sometimes, migraine headaches can cause nausea, vomiting, sensitivity to light and sound, and vision changes.  Migraines  are often diagnosed based on your symptoms, medical history, and a physical exam. This information is not intended to replace advice given to you by your health care provider. Make sure you discuss any questions you have with your health care provider. Document Revised: 01/21/2017 Document Reviewed: 10/31/2015 Elsevier Patient Education  2020 [employer].

## 2019-12-25 ENCOUNTER — Telehealth: Payer: Self-pay | Admitting: Neurology

## 2019-12-25 LAB — COMPREHENSIVE METABOLIC PANEL
ALT: 14 IU/L (ref 0–32)
AST: 14 IU/L (ref 0–40)
Albumin/Globulin Ratio: 1.7 (ref 1.2–2.2)
Albumin: 4.4 g/dL (ref 3.8–4.8)
Alkaline Phosphatase: 59 IU/L (ref 44–121)
BUN/Creatinine Ratio: 13 (ref 9–23)
BUN: 13 mg/dL (ref 6–24)
Bilirubin Total: 0.9 mg/dL (ref 0.0–1.2)
CO2: 24 mmol/L (ref 20–29)
Calcium: 9.3 mg/dL (ref 8.7–10.2)
Chloride: 103 mmol/L (ref 96–106)
Creatinine, Ser: 0.98 mg/dL (ref 0.57–1.00)
GFR calc Af Amer: 80 mL/min/{1.73_m2} (ref >59)
GFR calc non Af Amer: 69 mL/min/{1.73_m2} (ref >59)
Globulin, Total: 2.6 g/dL (ref 1.5–4.5)
Glucose: 82 mg/dL (ref 65–99)
Potassium: 4.3 mmol/L (ref 3.5–5.2)
Sodium: 141 mmol/L (ref 134–144)
Total Protein: 7 g/dL (ref 6.0–8.5)

## 2019-12-25 LAB — CBC WITH DIFFERENTIAL/PLATELET
Basophils Absolute: 0 10*3/uL (ref 0.0–0.2)
Basos: 1 %
EOS (ABSOLUTE): 0 10*3/uL (ref 0.0–0.4)
Eos: 1 %
Hematocrit: 43.3 % (ref 34.0–46.6)
Hemoglobin: 14.8 g/dL (ref 11.1–15.9)
Immature Grans (Abs): 0 10*3/uL (ref 0.0–0.1)
Immature Granulocytes: 0 %
Lymphocytes Absolute: 2 10*3/uL (ref 0.7–3.1)
Lymphs: 49 %
MCH: 30 pg (ref 26.6–33.0)
MCHC: 34.2 g/dL (ref 31.5–35.7)
MCV: 88 fL (ref 79–97)
Monocytes Absolute: 0.4 10*3/uL (ref 0.1–0.9)
Monocytes: 11 %
Neutrophils Absolute: 1.5 10*3/uL (ref 1.4–7.0)
Neutrophils: 38 %
Platelets: 235 10*3/uL (ref 150–450)
RBC: 4.93 x10E6/uL (ref 3.77–5.28)
RDW: 12 % (ref 11.7–15.4)
WBC: 3.9 10*3/uL (ref 3.4–10.8)

## 2019-12-25 NOTE — Telephone Encounter (Signed)
Novella Rob: [id number] (exp. 12/25/19 to 03/24/20)

## 2019-12-25 NOTE — Telephone Encounter (Signed)
Called and reviewed the normal lab findings with the patient. Advised that Dr Brett Fairy didn't see anything concerning.

## 2019-12-25 NOTE — Progress Notes (Signed)
Normal CBC and CMET results-

## 2019-12-25 NOTE — Telephone Encounter (Signed)
-----   Message from Larey Seat, MD sent at 12/25/2019 12:46 PM EST ----- Normal CBC and CMET results-

## 2019-12-26 ENCOUNTER — Ambulatory Visit
Admission: RE | Admit: 2019-12-26 | Discharge: 2019-12-26 | Disposition: A | Discharge status: Home / Self Care | Payer: Managed Care, Other (non HMO) | Source: Ambulatory Visit | Attending: Neurology | Admitting: Neurology

## 2019-12-26 ENCOUNTER — Other Ambulatory Visit: Payer: Self-pay

## 2019-12-26 DIAGNOSIS — R519 Headache, unspecified: Secondary | ICD-10-CM

## 2019-12-26 DIAGNOSIS — Z8619 Personal history of other infectious and parasitic diseases: Secondary | ICD-10-CM

## 2019-12-26 DIAGNOSIS — G43519 Persistent migraine aura without cerebral infarction, intractable, without status migrainosus: Secondary | ICD-10-CM

## 2019-12-26 DIAGNOSIS — T753XXA Motion sickness, initial encounter: Secondary | ICD-10-CM

## 2019-12-31 NOTE — Progress Notes (Signed)
IMPRESSION by Dr. Leta Baptist, 12-29-2019.   EXAM: MR BRAIN WO CONTRAST   MRI brain (without) demonstrating: - Few scattered periventricular and subcortical foci of non-specific gliosis. - No acute findings.   CD:  This finding cannot explain headaches, but rules out severe brain conditions that could have caused headaches.

## 2020-01-01 ENCOUNTER — Telehealth: Payer: Self-pay | Admitting: *Deleted

## 2020-01-01 NOTE — Telephone Encounter (Signed)
Called mobile #, message "due to technical difficulties your call cannot be completed at this time".

## 2020-01-02 ENCOUNTER — Telehealth: Payer: Self-pay

## 2020-01-02 ENCOUNTER — Encounter: Payer: Self-pay | Admitting: Neurology

## 2020-01-02 ENCOUNTER — Telehealth: Payer: Self-pay | Admitting: Neurology

## 2020-01-02 NOTE — Telephone Encounter (Signed)
Unable to LVM for pt to call me back to schedule sleep study. Mailbox full. Will call patient again soon.

## 2020-01-02 NOTE — Telephone Encounter (Signed)
Pt was called back, the message that MRI didnt show anything concerning was relayed.  Pt had no questions.  This is Pharmacist, hospital

## 2020-01-23 ENCOUNTER — Other Ambulatory Visit: Payer: Self-pay | Admitting: Urology

## 2020-02-06 ENCOUNTER — Ambulatory Visit (INDEPENDENT_AMBULATORY_CARE_PROVIDER_SITE_OTHER): Payer: Managed Care, Other (non HMO) | Admitting: Neurology

## 2020-02-06 DIAGNOSIS — G4733 Obstructive sleep apnea (adult) (pediatric): Secondary | ICD-10-CM

## 2020-02-06 DIAGNOSIS — T753XXA Motion sickness, initial encounter: Secondary | ICD-10-CM

## 2020-02-06 DIAGNOSIS — Z8619 Personal history of other infectious and parasitic diseases: Secondary | ICD-10-CM

## 2020-02-06 DIAGNOSIS — G43519 Persistent migraine aura without cerebral infarction, intractable, without status migrainosus: Secondary | ICD-10-CM

## 2020-02-06 DIAGNOSIS — R519 Headache, unspecified: Secondary | ICD-10-CM

## 2020-02-13 ENCOUNTER — Other Ambulatory Visit: Payer: Self-pay | Admitting: Internal Medicine

## 2020-02-13 DIAGNOSIS — Z0000 Encounter for general adult medical examination without abnormal findings: Secondary | ICD-10-CM

## 2020-02-13 NOTE — Progress Notes (Signed)
IMPRESSION: This HST confirmed the presence of mild OSA (obstructive sleep apnea) at an AHI of only 7.4/h and accentuated by REM sleep to 17/h.     RECOMMENDATION: This mild sleep apnea was not associated with hypoxia and can be treated with a dental device, with CPAP and by avoiding supine sleep. Weight loss will be beneficial.

## 2020-02-13 NOTE — Progress Notes (Signed)
   [facility]  HOME SLEEP TEST (Watch PAT)  STUDY DATE: 01/05- data loaded 02-12-2020  DOB: 10/19/1970  MRN: [id number]  ORDERING CLINICIAN: Larey Seat, MD   REFERRING CLINICIAN: [name], [name] Him, MD   CLINICAL INFORMATION/HISTORY: Tamara Watson has a past medical history of Allergy, Anxiety, Chronic constipation, Chronic kidney disease, Frequency of urination, GERD (gastroesophageal reflux disease), History of sepsis, IBS (irritable colon syndrome), IDA (iron deficiency anemia), Left ureteral stone, and Seasonal allergic rhinitis.  Tamara Watson, who prefers to go by her middle name "[name]" is a 50 year old [name] employee   She stated that she got sick from September to October 2020 was admitted first with a kidney stone which then formed an abscess and she became septic. In January she had a lot of throat issues and upper respiratory infection.  She had a chronic cough hacking cough and the feeling that something was stuck.  Since her urosepsis she has had flank pain, discomfort in her legs she cannot sit for long time she feels that she has to stretch and move.  She has developed serious headaches beginning with her hospitalization and she has also developed an inability to stay asleep. She actually goes to sleep between 9 - 11 but wakes up between 1 or 2 AM and then tosses and turns for the rest of the night unable to get more sleep.   Epworth sleepiness score: 06/24.  BMI: 35.3 kg/m  FINDINGS:   Total Record Time (hours, min): 8 h 46 min Total Sleep Time (hours, min):  7 h 24 min  Percent REM (%):    29.74 %   Calculated pAHI (per hour):  7.4       REM pAHI:    17.7     NREM pAHI: 4.4  Supine AHI: 10.5   Oxygen Saturation (%) Mean: 94  Minimum oxygen saturation (%):        86   O2 Saturation Range (%): 86-98  O2Saturation (minutes) <=88%:  0.2 min   Pulse Mean (bpm):    82  Pulse Range  (69-111)   IMPRESSION: This HST confirmed the presence of mild OSA (obstructive sleep apnea) at an AHI of only 7.4/h and accentuated by REM sleep to 17/h.     RECOMMENDATION: This mild sleep apnea was not associated with hypoxia and can be treated with a dental device, with CPAP and by avoiding supine sleep. Weight loss will be beneficial.     INTERPRETING PHYSICIAN:  Larey Seat, MD  [facility] Board certified by The [employer] of Sleep Medicine , Fellow of the [employer] of Neurology. Medical Director of [employer].

## 2020-02-21 ENCOUNTER — Encounter: Payer: Self-pay | Admitting: Neurology

## 2020-02-21 ENCOUNTER — Telehealth: Payer: Self-pay | Admitting: Neurology

## 2020-02-21 NOTE — Telephone Encounter (Signed)
Called patient to discuss sleep study results. No answer at this time. LVM for the patient to call back.  Will also send a mychart message to the patient.  

## 2020-02-21 NOTE — Telephone Encounter (Signed)
-----   Message from Larey Seat, MD sent at 02/13/2020  5:08 PM EST ----- IMPRESSION: This HST confirmed the presence of mild OSA (obstructive sleep apnea) at an AHI of only 7.4/h and accentuated by REM sleep to 17/h.     RECOMMENDATION: This mild sleep apnea was not associated with hypoxia and can be treated with a dental device, with CPAP and by avoiding supine sleep. Weight loss will be beneficial.

## 2020-03-04 ENCOUNTER — Ambulatory Visit
Admission: RE | Admit: 2020-03-04 | Discharge: 2020-03-04 | Disposition: A | Discharge status: Home / Self Care | Payer: No Typology Code available for payment source | Source: Ambulatory Visit | Attending: Internal Medicine | Admitting: Internal Medicine

## 2020-03-04 DIAGNOSIS — Z0000 Encounter for general adult medical examination without abnormal findings: Secondary | ICD-10-CM

## 2020-04-30 ENCOUNTER — Other Ambulatory Visit: Payer: Self-pay

## 2020-04-30 ENCOUNTER — Encounter: Payer: Self-pay | Admitting: Urology

## 2020-04-30 ENCOUNTER — Ambulatory Visit (INDEPENDENT_AMBULATORY_CARE_PROVIDER_SITE_OTHER): Payer: Managed Care, Other (non HMO) | Admitting: Urology

## 2020-04-30 ENCOUNTER — Ambulatory Visit (HOSPITAL_COMMUNITY)
Admission: RE | Admit: 2020-04-30 | Discharge: 2020-04-30 | Disposition: A | Discharge status: Home / Self Care | Payer: Managed Care, Other (non HMO) | Source: Ambulatory Visit | Attending: Urology | Admitting: Urology

## 2020-04-30 VITALS — BP 108/78 | HR 74 | Temp 98.8°F | Ht 65.5 in | Wt 210.0 lb

## 2020-04-30 DIAGNOSIS — R1010 Upper abdominal pain, unspecified: Secondary | ICD-10-CM | POA: Diagnosis not present

## 2020-04-30 DIAGNOSIS — N3281 Overactive bladder: Secondary | ICD-10-CM | POA: Diagnosis not present

## 2020-04-30 DIAGNOSIS — N200 Calculus of kidney: Secondary | ICD-10-CM | POA: Diagnosis not present

## 2020-04-30 LAB — URINALYSIS, ROUTINE W REFLEX MICROSCOPIC
Bilirubin, UA: NEGATIVE
Glucose, UA: NEGATIVE
Ketones, UA: NEGATIVE
Leukocytes,UA: NEGATIVE
Nitrite, UA: NEGATIVE
Protein,UA: NEGATIVE
RBC, UA: NEGATIVE
Specific Gravity, UA: 1.02 (ref 1.005–1.030)
Urobilinogen, Ur: 0.2 mg/dL (ref 0.2–1.0)
pH, UA: 6 (ref 5.0–7.5)

## 2020-04-30 MED ORDER — GEMTESA 75 MG PO TABS: 1.0000 | ORAL_TABLET | Freq: Every day | ORAL | 0 refills | Status: DC

## 2020-04-30 NOTE — Progress Notes (Signed)
04/30/2020 11:30 AM   Tamara Watson 20-Sep-1970 [id number]  Referring provider: Haywood Pao, MD [address] [place],  [place] [address]  followup nephrolithiasis and OAB  HPI: Tamara Watson is a 50yo here for followup for OAB and nephrolithiasis. She is having intermittent nausea and vomiting with associated right flank pain. It has been present for 1 month. She has a hx of nephrolithiasis last treated in 2020. No other associated symptoms. She continues to have issues with urinary frequency, urgency and nocturia 1-2x. She is currently on vesicare and is unhappy with her OAB symptoms. She has failed mirabegron and toviaz.    PMH: Past Medical History:  Diagnosis Date  . Allergy    seasonal  . Anxiety    in school only   . Chronic constipation   . Chronic kidney disease    kidney stones  . Frequency of urination   . GERD (gastroesophageal reflux disease)   . History of sepsis    10-29-2018  sepsis secondary to pyelonephritis  . IBS (irritable colon syndrome)   . IDA (iron deficiency anemia)   . Left ureteral stone   . Seasonal allergic rhinitis     Surgical History: Past Surgical History:  Procedure Laterality Date  . BREAST LUMPECTOMY Left 2000 approx.   benign   . CYSTOSCOPY  06/14/2018   Procedure: Cystoscopy;  Surgeon: Waymon Amato, MD;  Location: [facility];  Service: Gynecology;;  . CYSTOSCOPY WITH RETROGRADE PYELOGRAM, URETEROSCOPY AND STENT PLACEMENT Left 11/23/2018   Procedure: CYSTOSCOPY WITH RETROGRADE PYELOGRAM, URETEROSCOPY AND STENT PLACEMENT: REMOVAL NEPHROSTOMY DRAINS X 2;  Surgeon: Cleon Gustin, MD;  Location: [facility];  Service: Urology;  Laterality: Left;  . DILATION AND CURETTAGE OF UTERUS  x2  last one 1997  . HYSTERECTOMY ABDOMINAL WITH SALPINGECTOMY Bilateral 06/14/2018   Procedure: HYSTERECTOMY ABDOMINAL WITH  BILATERAL SALPINGECTOMY;  Surgeon: Waymon Amato, MD;  Location: [place];  Service: Gynecology;  Laterality:  Bilateral;  . IR NEPHROSTOMY EXCHANGE LEFT  11/03/2018  . IR NEPHROSTOMY PLACEMENT LEFT  10/29/2018  . IR RADIOLOGIST EVAL & MGMT  11/16/2018  . TUBAL LIGATION Bilateral 2002   PPTL    Home Medications:  Allergies as of 04/30/2020   No Known Allergies     Medication List       Accurate as of April 30, 2020 11:30 AM. If you have any questions, ask your nurse or doctor.        STOP taking these medications   acetaminophen 500 MG tablet Commonly known as: TYLENOL Stopped by: Nicolette Bang, MD     TAKE these medications   benzonatate 100 MG capsule Commonly known as: TESSALON Take by mouth 3 (three) times daily as needed for cough.   cyclobenzaprine 5 MG tablet Commonly known as: FLEXERIL Take 5 mg by mouth 3 (three) times daily as needed for muscle spasms. Take one at night-   Dexilant 60 MG capsule Generic drug: dexlansoprazole TAKE 1 CAPSULE BY MOUTH DAILY BEFORE BREAKFAST.   fluticasone 50 MCG/ACT nasal spray Commonly known as: FLONASE Place 2 sprays into both nostrils daily. What changed:   when to take this  reasons to take this   Linzess 145 MCG Caps capsule Generic drug: linaclotide TAKE 1 CAPSULE (145 MCG TOTAL) BY MOUTH DAILY BEFORE BREAKFAST.   pramoxine-hydrocortisone 1-1 % rectal cream Commonly known as: PROCTOCREAM-HC Place 1 application rectally 2 (two) times daily.   propranolol 20 MG tablet Commonly known as: INDERAL Take 1 tablet (  20 mg total) by mouth 2 (two) times daily.   senna-docusate 8.6-50 MG tablet Commonly known as: Senokot-S Take 2 tablets by mouth at bedtime. What changed:   when to take this  reasons to take this   solifenacin 10 MG tablet Commonly known as: VESICARE TAKE 1 TABLET BY MOUTH EVERY DAY   vitamin C 500 MG tablet Commonly known as: ASCORBIC ACID Take 500 mg by mouth daily as needed (immune system boost/ cold symptoms).       Allergies: No Known Allergies  Family History: Family History  Problem  Relation Age of Onset  . Cancer Maternal Aunt   . Ovarian cancer Maternal Aunt   . Cancer Paternal Grandmother   . Pancreatic cancer Paternal Grandmother   . Hyperlipidemia Mother   . Diabetes Mother   . Hyperlipidemia Father   . Breast cancer Maternal Aunt   . Colon cancer Neg Hx   . Esophageal cancer Neg Hx   . Stomach cancer Neg Hx   . Rectal cancer Neg Hx   . Colon polyps Neg Hx     Social History:  reports that she has never smoked. She has never used smokeless tobacco. She reports current alcohol use of about 1.0 standard drink of alcohol per week. She reports that she does not use drugs.  ROS: All other review of systems were reviewed and are negative except what is noted above in HPI  Physical Exam: BP 108/78   Pulse 74   Temp 98.8 F (37.1 C)   Ht 5' 5.5" (1.664 m)   Wt 210 lb (95.3 kg)   LMP 06/05/2018   BMI 34.41 kg/m   Constitutional:  Alert and oriented, No acute distress. HEENT: [place] AT, moist mucus membranes.  Trachea midline, no masses. Cardiovascular: No clubbing, cyanosis, or edema. Respiratory: Normal respiratory effort, no increased work of breathing. GI: Abdomen is soft, nontender, nondistended, no abdominal masses GU: No CVA tenderness.  Lymph: No cervical or inguinal lymphadenopathy. Skin: No rashes, bruises or suspicious lesions. Neurologic: Grossly intact, no focal deficits, moving all 4 extremities. Psychiatric: Normal mood and affect.  Laboratory Data: Lab Results  Component Value Date   WBC 3.9 12/24/2019   HGB 14.8 12/24/2019   HCT 43.3 12/24/2019   MCV 88 12/24/2019   PLT 235 12/24/2019    Lab Results  Component Value Date   CREATININE 0.98 12/24/2019    No results found for: PSA  No results found for: TESTOSTERONE  No results found for: HGBA1C  Urinalysis    Component Value Date/Time   COLORURINE YELLOW 10/29/2018 1423   APPEARANCEUR HAZY (A) 10/29/2018 1423   LABSPEC 1.023 10/29/2018 1423   PHURINE 5.0 10/29/2018 1423    GLUCOSEU NEGATIVE 10/29/2018 1423   HGBUR SMALL (A) 10/29/2018 1423   BILIRUBINUR NEGATIVE 10/29/2018 1423   KETONESUR 5 (A) 10/29/2018 1423   PROTEINUR 100 (A) 10/29/2018 1423   UROBILINOGEN 1.0 05/12/2014 1940   NITRITE NEGATIVE 10/29/2018 1423   LEUKOCYTESUR NEGATIVE 10/29/2018 1423    Lab Results  Component Value Date   BACTERIA RARE (A) 10/29/2018    Pertinent Imaging:  No results found for this or any previous visit.  No results found for this or any previous visit.  No results found for this or any previous visit.  No results found for this or any previous visit.  No results found for this or any previous visit.  No results found for this or any previous visit.  No results found for  this or any previous visit.  Results for orders placed during the hospital encounter of 10/29/18  CT Renal Stone Study  Narrative CLINICAL DATA:  LEFT flank pain abdominal pain.  Nausea  EXAM: CT ABDOMEN AND PELVIS WITHOUT CONTRAST  TECHNIQUE: Multidetector CT imaging of the abdomen and pelvis was performed following the standard protocol without IV contrast.  COMPARISON:  10/27/2018  FINDINGS: Lower chest: Lung bases clear  Hepatobiliary: No focal hepatic lesion.  Gallbladder normal.  Pancreas: Pancreas normal  Spleen: Normal spleen  Adrenals/Urinary Tract: Adrenal glands are normal. Mild renal edema, hydronephrosis, and hydroureter of the LEFT kidney again noted. The distal LEFT ureteral calculus has migrated approximately 1 cm compared to exam 10/27/2018. Calculus position at the level the LEFT operator space measuring 4 mm by 6 mm (image 77/3). Calculus is approximately 2 cm from the vesicoureteral junction.  No nephrolithiasis.  No RIGHT ureterolithiasis.  No bladder calculi  Stomach/Bowel: Stomach, small-bowel, appendix and cecum normal. The colon rectosigmoid colon normal.  Vascular/Lymphatic: Abdominal aorta is normal caliber. There is  no retroperitoneal or periportal lymphadenopathy. No pelvic lymphadenopathy.  Reproductive: Post hysterectomy. Cystic enlargement of the RIGHT ovary to 4.2 x 3.8 cm again noted. Cystic component measures approximately 3.1 cm.  Other: No free fluid  Musculoskeletal: No aggressive osseous lesion  IMPRESSION: 1. Partially obstructing calculus in the distal LEFT ureter has migrated approximately 1 cm towards the vesicoureteral junction. 2. Cystic lesion of the RIGHT ovary. No follow-up necessary in this age group. This recommendation follows ACR consensus guidelines: White Paper of the ACR Incidental Findings Committee II on Adnexal Findings. J Am Coll Radiol [phone].   Electronically Signed By: Suzy Bouchard M.D. On: 10/29/2018 15:37   Assessment & Plan:    1. Kidney stone -CT stone study - Urinalysis, Routine w reflex microscopic  2. OAB (overactive bladder) Gemtesa 75mg  daily   No follow-ups on file.  Nicolette Bang, MD  [facility] [place]

## 2020-04-30 NOTE — Patient Instructions (Signed)

## 2020-04-30 NOTE — Progress Notes (Signed)
Urological Symptom Review  Patient is experiencing the following symptoms: Frequent urination Get up at night to urinate   Review of Systems  Gastrointestinal (upper)  : Negative for upper GI symptoms  Gastrointestinal (lower) : Constipation  Constitutional : Negative for symptoms  Skin: Negative for skin symptoms  Eyes: Negative for eye symptoms  Ear/Nose/Throat : Negative for Ear/Nose/Throat symptoms  Hematologic/Lymphatic: Negative for Hematologic/Lymphatic symptoms  Cardiovascular : Negative for cardiovascular symptoms  Respiratory : Negative for respiratory symptoms  Endocrine: Negative for endocrine symptoms  Musculoskeletal: Joint pain  Neurological: Headaches  Psychologic: Negative for psychiatric symptoms

## 2020-05-30 ENCOUNTER — Other Ambulatory Visit: Payer: Self-pay | Admitting: Gastroenterology

## 2020-05-30 DIAGNOSIS — K219 Gastro-esophageal reflux disease without esophagitis: Secondary | ICD-10-CM

## 2020-06-25 ENCOUNTER — Encounter: Payer: Self-pay | Admitting: Hematology

## 2020-07-30 ENCOUNTER — Ambulatory Visit: Payer: Managed Care, Other (non HMO) | Admitting: Urology

## 2020-08-30 ENCOUNTER — Encounter: Payer: Self-pay | Admitting: Hematology

## 2020-09-01 ENCOUNTER — Encounter: Payer: Self-pay | Admitting: Hematology

## 2020-09-04 ENCOUNTER — Encounter: Payer: Self-pay | Admitting: Hematology

## 2020-09-04 ENCOUNTER — Other Ambulatory Visit: Payer: Self-pay

## 2020-09-04 ENCOUNTER — Ambulatory Visit: Payer: Managed Care, Other (non HMO) | Attending: Internal Medicine

## 2020-09-04 ENCOUNTER — Other Ambulatory Visit (HOSPITAL_BASED_OUTPATIENT_CLINIC_OR_DEPARTMENT_OTHER): Payer: Self-pay

## 2020-09-04 DIAGNOSIS — Z23 Encounter for immunization: Secondary | ICD-10-CM

## 2020-09-04 MED ORDER — PFIZER-BIONT COVID-19 VAC-TRIS 30 MCG/0.3ML IM SUSP
INTRAMUSCULAR | 0 refills | Status: DC
  Filled 2020-09-04: qty 0.3, 1d supply, fill #0

## 2020-09-04 NOTE — Progress Notes (Signed)
   Covid-19 Vaccination Clinic  Name:  LOYCE ATAMIAN    MRN: [id number] DOB: [birth year]  09/04/2020  Ms. Tom-Johnson was observed post Covid-19 immunization for 15 minutes without incident. She was provided with Vaccine Information Sheet and instruction to access the V-Safe system.   Ms. Laubscher was instructed to call 911 with any severe reactions post vaccine: Difficulty breathing  Swelling of face and throat  A fast heartbeat  A bad rash all over body  Dizziness and weakness   Immunizations Administered     Name Date Dose VIS Date Route   PFIZER Comrnaty(Gray TOP) Covid-19 Vaccine 09/04/2020  3:33 PM 0.3 mL 01/10/2020 Intramuscular   Manufacturer: [employer]   Lot: [id number]   [place]: [phone]

## 2020-10-13 ENCOUNTER — Ambulatory Visit: Payer: Managed Care, Other (non HMO) | Admitting: Urology

## 2020-10-13 DIAGNOSIS — N200 Calculus of kidney: Secondary | ICD-10-CM

## 2020-10-13 DIAGNOSIS — N3281 Overactive bladder: Secondary | ICD-10-CM

## 2020-11-04 ENCOUNTER — Encounter: Payer: Self-pay | Admitting: Urology

## 2020-11-04 ENCOUNTER — Other Ambulatory Visit: Payer: Self-pay

## 2020-11-04 ENCOUNTER — Ambulatory Visit (INDEPENDENT_AMBULATORY_CARE_PROVIDER_SITE_OTHER): Payer: Managed Care, Other (non HMO) | Admitting: Urology

## 2020-11-04 VITALS — BP 111/70 | HR 84

## 2020-11-04 DIAGNOSIS — N3281 Overactive bladder: Secondary | ICD-10-CM | POA: Diagnosis not present

## 2020-11-04 DIAGNOSIS — N200 Calculus of kidney: Secondary | ICD-10-CM

## 2020-11-04 LAB — URINALYSIS, ROUTINE W REFLEX MICROSCOPIC
Bilirubin, UA: NEGATIVE
Glucose, UA: NEGATIVE
Ketones, UA: NEGATIVE
Leukocytes,UA: NEGATIVE
Nitrite, UA: NEGATIVE
Protein,UA: NEGATIVE
RBC, UA: NEGATIVE
Specific Gravity, UA: 1.01 (ref 1.005–1.030)
Urobilinogen, Ur: 0.2 mg/dL (ref 0.2–1.0)
pH, UA: 5.5 (ref 5.0–7.5)

## 2020-11-04 NOTE — Progress Notes (Signed)
11/04/2020 4:26 PM   Tamara Watson 17-Aug-1970 [id number]  Referring provider: Haywood Pao, MD [place],  [place] [address]  Followup nephrolithiasis and OAB   HPI: Ms Watson is a 50yo here for followup for OAB and nephrolithiasis. No stone events since last visit. She is on vesicare for OAB which works intermittently and she still has urinary urgency and pelvic pain if she prolongs urination after she gets the urge to urinate. She has issues with constipation and takes linzess. She tried gemtesa for a couple of days but then stopped the medication.    PMH: Past Medical History:  Diagnosis Date   Allergy    seasonal   Anxiety    in school only    Chronic constipation    Chronic kidney disease    kidney stones   Frequency of urination    GERD (gastroesophageal reflux disease)    History of sepsis    10-29-2018  sepsis secondary to pyelonephritis   IBS (irritable colon syndrome)    IDA (iron deficiency anemia)    Left ureteral stone    Seasonal allergic rhinitis     Surgical History: Past Surgical History:  Procedure Laterality Date   BREAST LUMPECTOMY Left 2000 approx.   benign    CYSTOSCOPY  06/14/2018   Procedure: Cystoscopy;  Surgeon: Waymon Amato, MD;  Location: [place];  Service: Gynecology;;   CYSTOSCOPY WITH RETROGRADE PYELOGRAM, URETEROSCOPY AND STENT PLACEMENT Left 11/23/2018   Procedure: CYSTOSCOPY WITH RETROGRADE PYELOGRAM, URETEROSCOPY AND STENT PLACEMENT: REMOVAL NEPHROSTOMY DRAINS X 2;  Surgeon: Cleon Gustin, MD;  Location[facility];  Service: Urology;  Laterality: Left;   DILATION AND CURETTAGE OF UTERUS  x2  last one 1997   HYSTERECTOMY ABDOMINAL WITH SALPINGECTOMY Bilateral 06/14/2018   Procedure: HYSTERECTOMY ABDOMINAL WITH  BILATERAL SALPINGECTOMY;  Surgeon: Waymon Amato, MD;  Location: [place];  Service: Gynecology;  Laterality: Bilateral;   IR NEPHROSTOMY EXCHANGE LEFT  11/03/2018   IR NEPHROSTOMY  PLACEMENT LEFT  10/29/2018   IR RADIOLOGIST EVAL & MGMT  11/16/2018   TUBAL LIGATION Bilateral 2002   PPTL    Home Medications:  Allergies as of 11/04/2020   No Known Allergies      Medication List        Accurate as of November 04, 2020  4:26 PM. If you have any questions, ask your nurse or doctor.          STOP taking these medications    Gemtesa 75 MG Tabs Generic drug: Vibegron Stopped by: Nicolette Bang, MD   propranolol 20 MG tablet Commonly known as: INDERAL Stopped by: Nicolette Bang, MD       TAKE these medications    benzonatate 100 MG capsule Commonly known as: TESSALON Take by mouth 3 (three) times daily as needed for cough.   cyclobenzaprine 5 MG tablet Commonly known as: FLEXERIL Take 5 mg by mouth 3 (three) times daily as needed for muscle spasms. Take one at night-   Dexilant 60 MG capsule Generic drug: dexlansoprazole TAKE 1 CAPSULE BY MOUTH DAILY BEFORE BREAKFAST.   fluticasone 50 MCG/ACT nasal spray Commonly known as: FLONASE Place 2 sprays into both nostrils daily. What changed:  when to take this reasons to take this   Linzess 145 MCG Caps capsule Generic drug: linaclotide TAKE 1 CAPSULE (145 MCG TOTAL) BY MOUTH DAILY BEFORE BREAKFAST.   [employer]Bio[employer] COVID-19 Vac-TriS Susp injection Generic drug: COVID-19 mRNA Vac-TriS (Pf[employer]) Inject into the muscle.  pramoxine-hydrocortisone 1-1 % rectal cream Commonly known as: PROCTOCREAM-HC Place 1 application rectally 2 (two) times daily.   senna-docusate 8.6-50 MG tablet Commonly known as: Senokot-S Take 2 tablets by mouth at bedtime. What changed:  when to take this reasons to take this   solifenacin 10 MG tablet Commonly known as: VESICARE TAKE 1 TABLET BY MOUTH EVERY DAY   vitamin C 500 MG tablet Commonly known as: ASCORBIC ACID Take 500 mg by mouth daily as needed (immune system boost/ cold symptoms).        Allergies: No Known Allergies  Family History: Family  History  Problem Relation Age of Onset   Cancer Maternal Aunt    Ovarian cancer Maternal Aunt    Cancer Paternal Grandmother    Pancreatic cancer Paternal Grandmother    Hyperlipidemia Mother    Diabetes Mother    Hyperlipidemia Father    Breast cancer Maternal Aunt    Colon cancer Neg Hx    Esophageal cancer Neg Hx    Stomach cancer Neg Hx    Rectal cancer Neg Hx    Colon polyps Neg Hx     Social History:  reports that she has never smoked. She has never used smokeless tobacco. She reports current alcohol use of about 1.0 standard drink per week. She reports that she does not use drugs.  ROS: All other review of systems were reviewed and are negative except what is noted above in HPI  Physical Exam: BP 111/70   Pulse 84   LMP 06/05/2018   Constitutional:  Alert and oriented, No acute distress. HEENT: [place] AT, moist mucus membranes.  Trachea midline, no masses. Cardiovascular: No clubbing, cyanosis, or edema. Respiratory: Normal respiratory effort, no increased work of breathing. GI: Abdomen is soft, nontender, nondistended, no abdominal masses GU: No CVA tenderness.  Lymph: No cervical or inguinal lymphadenopathy. Skin: No rashes, bruises or suspicious lesions. Neurologic: Grossly intact, no focal deficits, moving all 4 extremities. Psychiatric: Normal mood and affect.  Laboratory Data: Lab Results  Component Value Date   WBC 3.9 12/24/2019   HGB 14.8 12/24/2019   HCT 43.3 12/24/2019   MCV 88 12/24/2019   PLT 235 12/24/2019    Lab Results  Component Value Date   CREATININE 0.98 12/24/2019    No results found for: PSA  No results found for: TESTOSTERONE  No results found for: HGBA1C  Urinalysis    Component Value Date/Time   COLORURINE YELLOW 10/29/2018 1423   APPEARANCEUR Clear 04/30/2020 1124   LABSPEC 1.023 10/29/2018 1423   PHURINE 5.0 10/29/2018 1423   GLUCOSEU Negative 04/30/2020 1124   HGBUR SMALL (A) 10/29/2018 1423   BILIRUBINUR Negative  04/30/2020 1124   KETONESUR 5 (A) 10/29/2018 1423   PROTEINUR Negative 04/30/2020 1124   PROTEINUR 100 (A) 10/29/2018 1423   UROBILINOGEN 1.0 05/12/2014 1940   NITRITE Negative 04/30/2020 1124   NITRITE NEGATIVE 10/29/2018 1423   LEUKOCYTESUR Negative 04/30/2020 1124   LEUKOCYTESUR NEGATIVE 10/29/2018 1423    Lab Results  Component Value Date   LABMICR Comment 04/30/2020   BACTERIA RARE (A) 10/29/2018    Pertinent Imaging:  No results found for this or any previous visit.  No results found for this or any previous visit.  No results found for this or any previous visit.  No results found for this or any previous visit.  No results found for this or any previous visit.  No results found for this or any previous visit.  No results found  for this or any previous visit.  Results for orders placed in visit on 04/30/20  CT RENAL STONE STUDY  Narrative CLINICAL DATA:  Flank pain, suspected kidney stone in 50 year old female.  EXAM: CT ABDOMEN AND PELVIS WITHOUT CONTRAST  TECHNIQUE: Multidetector CT imaging of the abdomen and pelvis was performed following the standard protocol without IV contrast.  COMPARISON:  November 16, 2018  FINDINGS: Lower chest: Lung bases are clear.  No effusion.  No consolidation.  Hepatobiliary: No focal, suspicious hepatic lesion on noncontrast imaging. No pericholecystic stranding. No gross biliary duct dilation. Limited assessment without contrast.  Pancreas: No contour abnormality.  No sign of inflammation.  Spleen: Spleen normal size and contour.  Adrenals/Urinary Tract: Adrenal glands are normal.  Smooth RIGHT renal contour. No hydronephrosis. No nephrolithiasis. No ureteral calculus.  Marked LEFT renal cortical scarring with parenchymal loss on the LEFT that has occurred since the previous imaging study.  Mild perivesical stranding is unchanged compared to the most recent study. Diminished suprapubic stranding when  compared to previous imaging.  Stomach/Bowel: No perigastric stranding. Stomach partially distended with ingested contents. No sign of small bowel obstruction or acute small bowel process. Stool fills much of the colon. Appendix not clearly visualized. No secondary signs to suggest acute appendicitis.  No pelvic lymphadenopathy.  Vascular/Lymphatic: Aorta normal caliber. No adenopathy in the retroperitoneum. Smooth contour the IVC. No upper abdominal or mesenteric lymphadenopathy.  Reproductive: Post hysterectomy.  No adnexal masses.  Other: Small fat containing umbilical hernia.  No ascites.  Musculoskeletal: No acute musculoskeletal process. Spinal degenerative changes. No destructive bone finding.  IMPRESSION: 1. Marked LEFT renal cortical scarring with parenchymal loss that has occurred since the previous imaging study. 2. Mild perivesical stranding is unchanged compared to the most recent study. Diminished suprapubic stranding when compared to previous imaging. Correlate with any clinical evidence of cystitis. 3.  No nephrolithiasis or ureteral calculus. 4. Appendix not clearly visualized. No secondary signs to suggest acute appendicitis. 5. Small fat containing umbilical hernia.   Electronically Signed By: Zetta Bills M.D. On: 04/30/2020 14:30   Assessment & Plan:    1. Kidney stone -RTC 6 weeks with renal US  2. OAB (overactive bladder) -Gemtesa 75mg     No follow-ups on file.  Nicolette Bang, MD  [facility] [place]

## 2020-11-04 NOTE — Patient Instructions (Signed)

## 2020-11-04 NOTE — Progress Notes (Signed)
Urological Symptom Review  Patient is experiencing the following symptoms: Frequent urination   Review of Systems  Gastrointestinal (upper)  : Negative for upper GI symptoms  Gastrointestinal (lower) : Constipation  Constitutional : Negative for symptoms  Skin: Negative for skin symptoms  Eyes: Negative for eye symptoms  Ear/Nose/Throat : Negative for Ear/Nose/Throat symptoms  Hematologic/Lymphatic: Negative for Hematologic/Lymphatic symptoms  Cardiovascular : Negative for cardiovascular symptoms  Respiratory : Cough(Chronic)  Endocrine: Negative for endocrine symptoms  Musculoskeletal: Negative for musculoskeletal symptoms  Neurological: Negative for neurological symptoms  Psychologic: Negative for psychiatric symptoms

## 2020-11-12 ENCOUNTER — Other Ambulatory Visit: Payer: Self-pay | Admitting: Gastroenterology

## 2020-12-13 IMAGING — CT CT RENAL STONE PROTOCOL
2 of 4 series · 15 of 46 positions shown, 17 images · non-contrast
Comparison: None.

CLINICAL DATA: Left flank and lower abdominal pain with nausea and
vomiting.

EXAM:
CT ABDOMEN AND PELVIS WITHOUT CONTRAST
TECHNIQUE: Multidetector CT imaging of the abdomen and pelvis was performed
following the standard protocol without IV contrast.

[Series 5: coronal soft tissue · coronal · 0.70mm/px · 3 of 101 slices shown]
[im 34/101  soft-tissue]
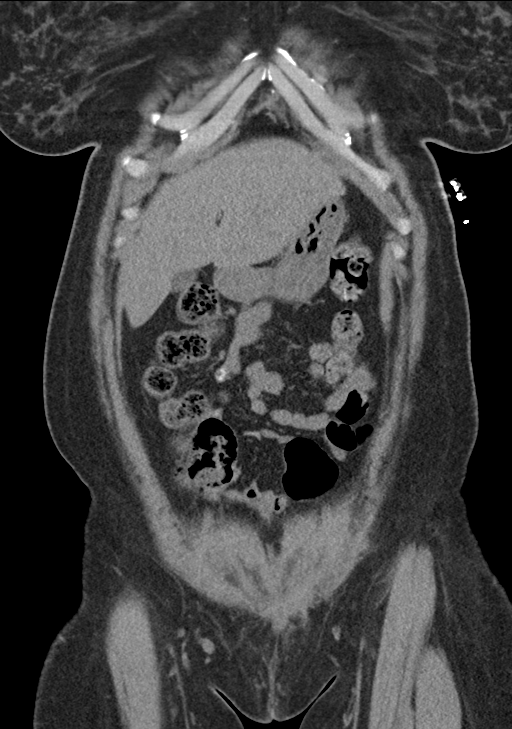
[im 45/101  soft-tissue]
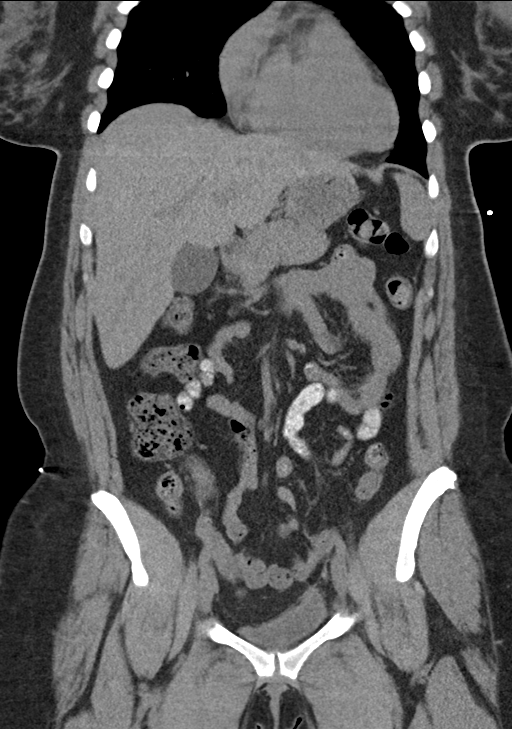
[im 56/101  soft-tissue]
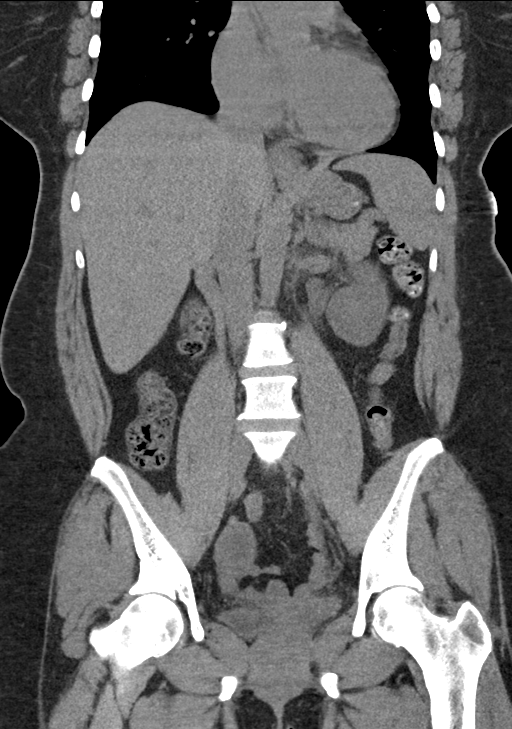

[Series 6: stone study 5.0 i30f 2 · axial · 0.72mm/px · z∈[+805,+1270]mm · 12 of 103 slices shown, 14 images]
[im 5/103  soft-tissue]
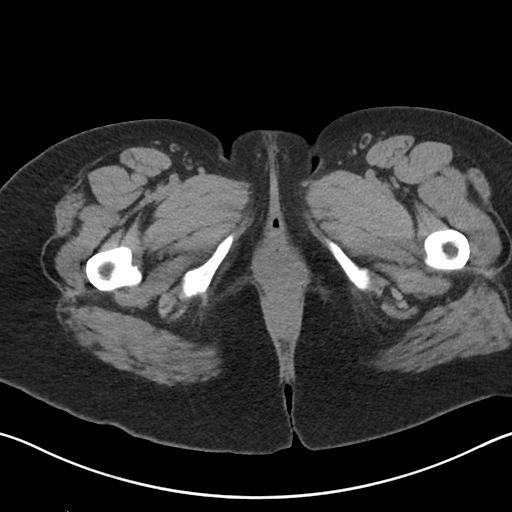
[im 5/103  bone]
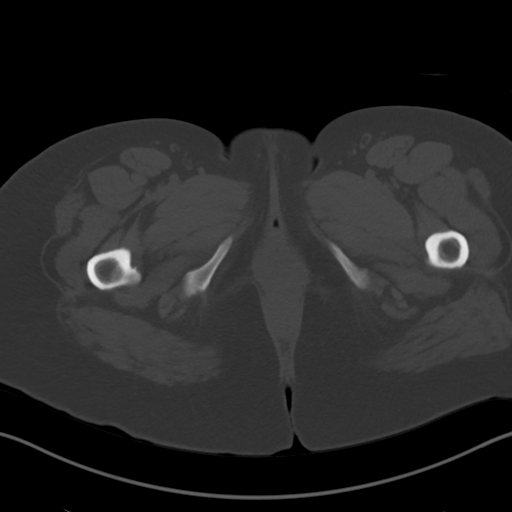
[im 14/103  soft-tissue]
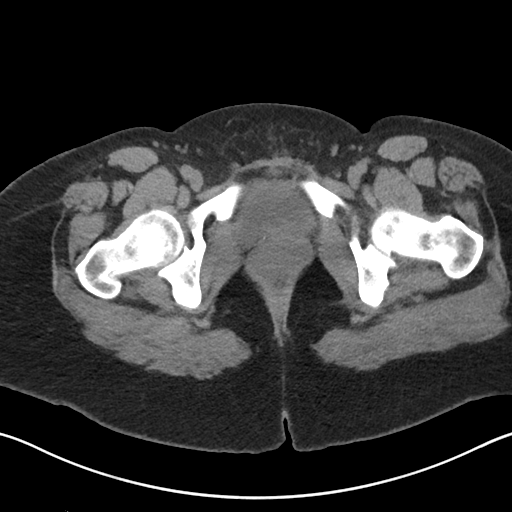
[im 23/103  soft-tissue]
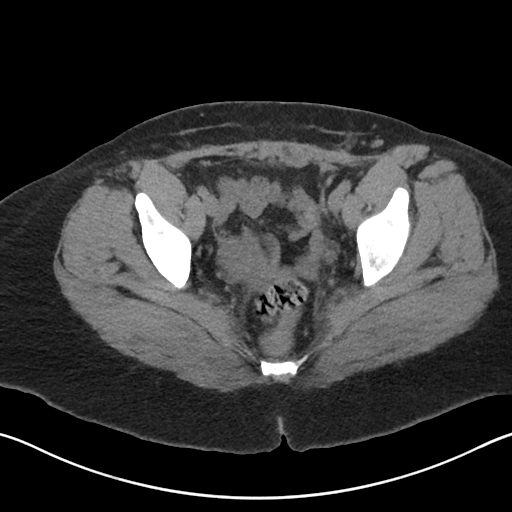
[im 32/103  soft-tissue]
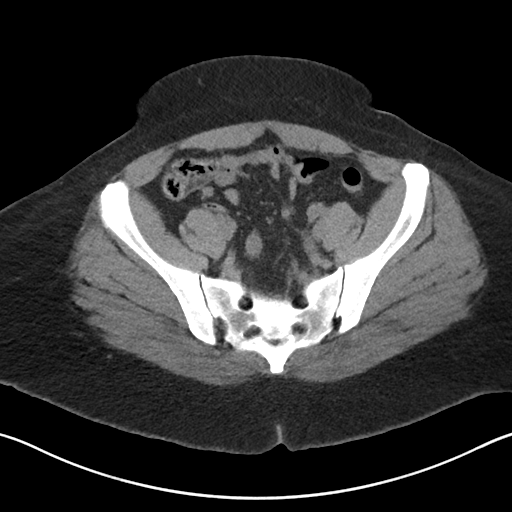
[im 40/103  soft-tissue]
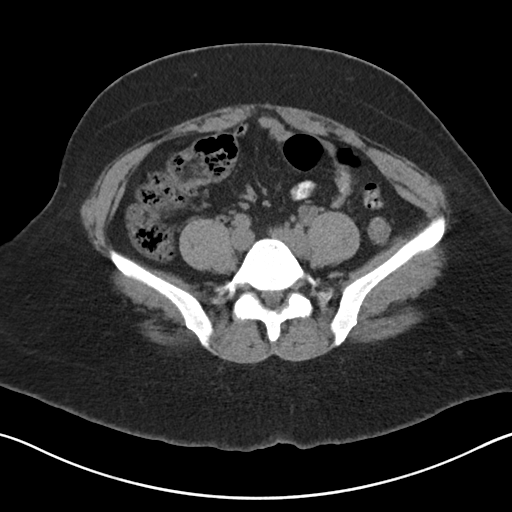
[im 49/103  soft-tissue]
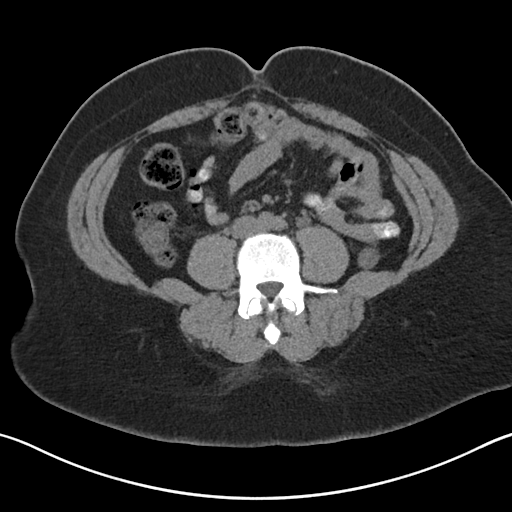
[im 54/103  soft-tissue]
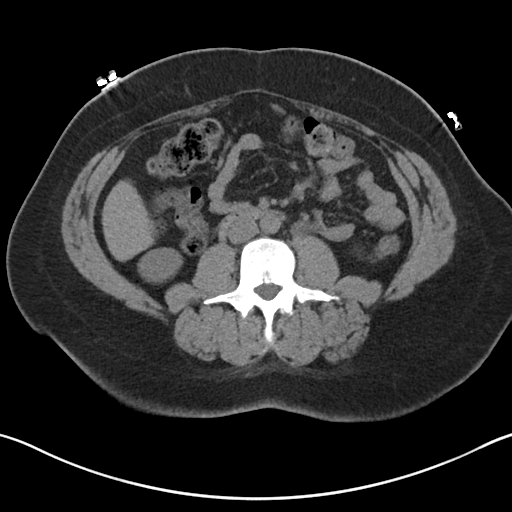
[im 63/103  soft-tissue]
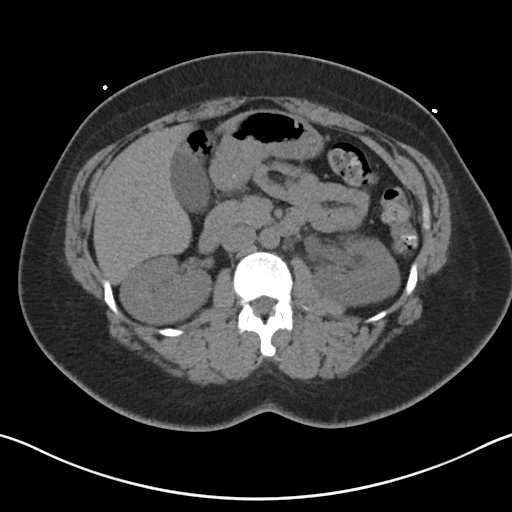
[im 71/103  soft-tissue]
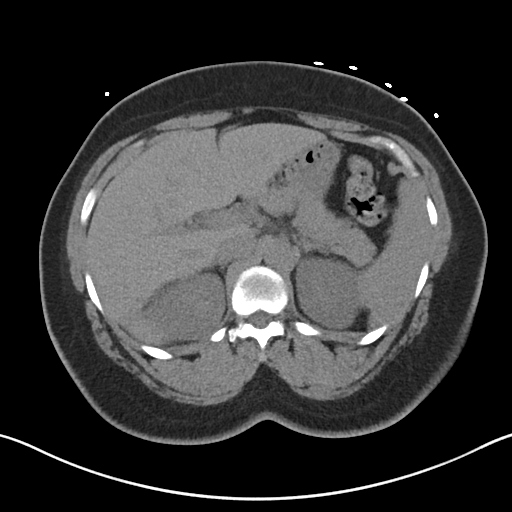
[im 71/103  bone]
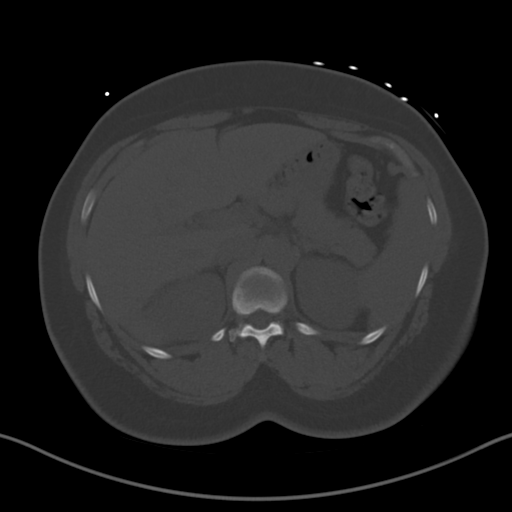
[im 80/103  soft-tissue]
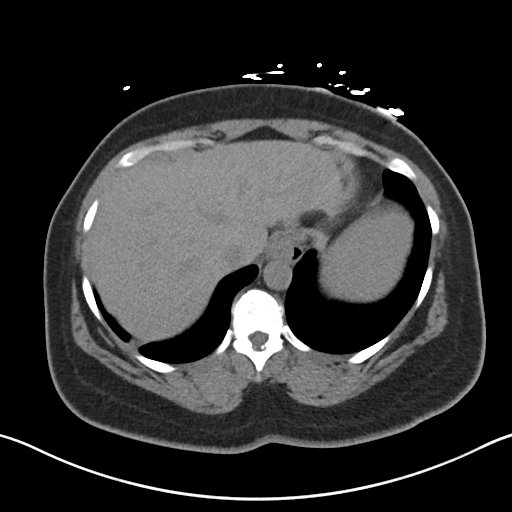
[im 89/103  soft-tissue]
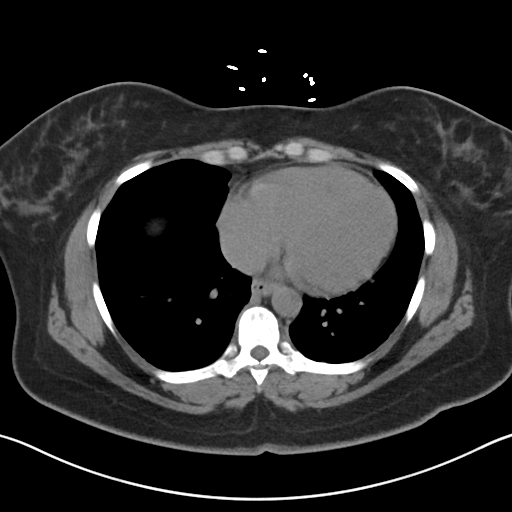
[im 98/103  soft-tissue]
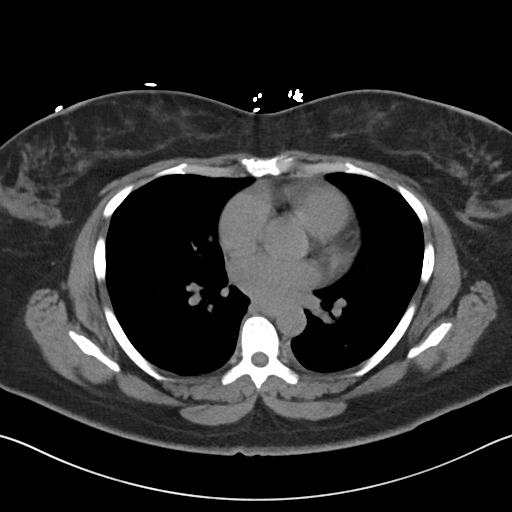

[15 of 46 positions shown; findings below may reference images not displayed]

FINDINGS: Lower chest: Mild dependent bibasilar atelectasis. No pleural or
pericardial effusion.

Hepatobiliary: No focal liver abnormality is seen. No gallstones,
gallbladder wall thickening, or biliary dilatation.

Pancreas: Unremarkable. No pancreatic ductal dilatation or
surrounding inflammatory changes.

Spleen: Normal in size without focal abnormality.

Adrenals/Urinary Tract: The adrenal glands appear normal. There is
mild to moderate left hydronephrosis with stranding about the left
kidney and ureter due to a 0.5 cm distal left ureteral stone. No
other urinary tract stones are identified. The kidneys otherwise
appear normal. Urinary bladder is decompressed but otherwise
unremarkable.

Stomach/Bowel: Stomach is within normal limits. Appendix appears
normal. No evidence of bowel wall thickening, distention, or
inflammatory changes.

Vascular/Lymphatic: No significant vascular findings are present. No
enlarged abdominal or pelvic lymph nodes.

Reproductive: Status post hysterectomy. No adnexal masses.

Other: Small fat containing umbilical hernia noted. Stranding in
subcutaneous fat anterior low pelvis is likely postoperative.

Musculoskeletal: No acute or focal abnormality.
IMPRESSION: Mild-to-moderate left hydronephrosis due to a 0.5 cm distal left
ureteral stone. No other urinary tract stones are seen.

Small fat containing umbilical hernia.

## 2020-12-16 ENCOUNTER — Telehealth: Payer: Managed Care, Other (non HMO) | Admitting: Urology

## 2020-12-16 ENCOUNTER — Telehealth: Payer: Self-pay

## 2020-12-16 DIAGNOSIS — N3281 Overactive bladder: Secondary | ICD-10-CM

## 2020-12-16 MED ORDER — GEMTESA 75 MG PO TABS: 1.0000 | ORAL_TABLET | Freq: Every day | ORAL | 4 refills | Status: DC

## 2020-12-16 NOTE — Telephone Encounter (Signed)
Patient needing refill on: Gemtesa - only 2 left.  Please call into:  [id number] - [place], [place]. Phone:  [phone]  Fax:  [phone]    Thanks, Helene Kelp

## 2020-12-16 NOTE — Telephone Encounter (Signed)
Order placed for [facility], sent to pharmacy per pt request.

## 2020-12-22 ENCOUNTER — Other Ambulatory Visit: Payer: Self-pay

## 2020-12-22 ENCOUNTER — Ambulatory Visit (HOSPITAL_COMMUNITY)
Admission: RE | Admit: 2020-12-22 | Discharge: 2020-12-22 | Disposition: A | Discharge status: Home / Self Care | Payer: Managed Care, Other (non HMO) | Source: Ambulatory Visit | Attending: Urology | Admitting: Urology

## 2020-12-22 DIAGNOSIS — N200 Calculus of kidney: Secondary | ICD-10-CM

## 2020-12-29 ENCOUNTER — Ambulatory Visit: Payer: Managed Care, Other (non HMO) | Admitting: Urology

## 2020-12-30 ENCOUNTER — Ambulatory Visit (INDEPENDENT_AMBULATORY_CARE_PROVIDER_SITE_OTHER): Payer: Managed Care, Other (non HMO) | Admitting: Urology

## 2020-12-30 ENCOUNTER — Other Ambulatory Visit: Payer: Self-pay

## 2020-12-30 DIAGNOSIS — N3281 Overactive bladder: Secondary | ICD-10-CM

## 2020-12-30 DIAGNOSIS — N200 Calculus of kidney: Secondary | ICD-10-CM

## 2020-12-30 NOTE — Progress Notes (Signed)
Patient rescheduled

## 2021-01-10 ENCOUNTER — Other Ambulatory Visit: Payer: Self-pay | Admitting: Urology

## 2021-03-15 ENCOUNTER — Other Ambulatory Visit: Payer: Self-pay | Admitting: Urology

## 2021-03-15 DIAGNOSIS — N3281 Overactive bladder: Secondary | ICD-10-CM

## 2021-03-24 ENCOUNTER — Other Ambulatory Visit: Payer: Self-pay | Admitting: Internal Medicine

## 2021-03-24 DIAGNOSIS — Z1231 Encounter for screening mammogram for malignant neoplasm of breast: Secondary | ICD-10-CM

## 2021-04-06 ENCOUNTER — Other Ambulatory Visit: Payer: Self-pay | Admitting: Gastroenterology

## 2021-04-06 DIAGNOSIS — K219 Gastro-esophageal reflux disease without esophagitis: Secondary | ICD-10-CM

## 2021-04-07 ENCOUNTER — Other Ambulatory Visit: Payer: Self-pay | Admitting: Gastroenterology

## 2021-04-07 DIAGNOSIS — K219 Gastro-esophageal reflux disease without esophagitis: Secondary | ICD-10-CM

## 2021-05-04 ENCOUNTER — Other Ambulatory Visit: Payer: Self-pay | Admitting: Gastroenterology

## 2021-05-04 ENCOUNTER — Telehealth: Payer: Self-pay | Admitting: Gastroenterology

## 2021-05-04 DIAGNOSIS — K219 Gastro-esophageal reflux disease without esophagitis: Secondary | ICD-10-CM

## 2021-05-04 MED ORDER — DEXLANSOPRAZOLE 60 MG PO CPDR: DELAYED_RELEASE_CAPSULE | ORAL | 0 refills | Status: DC

## 2021-05-04 NOTE — Telephone Encounter (Signed)
Patient has an appointment  sent in refill  ?

## 2021-05-04 NOTE — Telephone Encounter (Signed)
Inbound call from patient requesting medication refill for Dexilant sent to [facility] on [address]  ?

## 2021-05-06 ENCOUNTER — Ambulatory Visit
Admission: RE | Admit: 2021-05-06 | Discharge: 2021-05-06 | Disposition: A | Discharge status: Home / Self Care | Payer: Managed Care, Other (non HMO) | Source: Ambulatory Visit | Attending: Internal Medicine | Admitting: Internal Medicine

## 2021-05-06 DIAGNOSIS — Z1231 Encounter for screening mammogram for malignant neoplasm of breast: Secondary | ICD-10-CM

## 2021-05-08 ENCOUNTER — Other Ambulatory Visit: Payer: Self-pay | Admitting: Gastroenterology

## 2021-05-08 DIAGNOSIS — K219 Gastro-esophageal reflux disease without esophagitis: Secondary | ICD-10-CM

## 2021-05-11 ENCOUNTER — Other Ambulatory Visit: Payer: Self-pay | Admitting: Gastroenterology

## 2021-05-11 DIAGNOSIS — K219 Gastro-esophageal reflux disease without esophagitis: Secondary | ICD-10-CM

## 2021-05-22 ENCOUNTER — Ambulatory Visit (INDEPENDENT_AMBULATORY_CARE_PROVIDER_SITE_OTHER): Payer: Managed Care, Other (non HMO) | Admitting: Physician Assistant

## 2021-05-22 ENCOUNTER — Other Ambulatory Visit: Payer: Self-pay | Admitting: Physician Assistant

## 2021-05-22 ENCOUNTER — Encounter: Payer: Self-pay | Admitting: Physician Assistant

## 2021-05-22 VITALS — BP 104/70 | HR 84 | Ht 65.5 in | Wt 221.5 lb

## 2021-05-22 DIAGNOSIS — K219 Gastro-esophageal reflux disease without esophagitis: Secondary | ICD-10-CM

## 2021-05-22 MED ORDER — DEXLANSOPRAZOLE 60 MG PO CPDR: 60.0000 mg | DELAYED_RELEASE_CAPSULE | Freq: Every day | ORAL | 3 refills | Status: DC

## 2021-05-22 NOTE — Patient Instructions (Signed)
We have sent the following medications to your pharmacy for you to pick up at your convenience: ?Dexilant 60 mg daily. ? ?If you are age 49 or older, your body mass index should be between 23-30. Your Body mass index is 36.3 kg/m?Marland Kitchen If this is out of the aforementioned range listed, please consider follow up with your Primary Care Provider. ? ?If you are age 47 or younger, your body mass index should be between 19-25. Your Body mass index is 36.3 kg/m?Marland Kitchen If this is out of the aformentioned range listed, please consider follow up with your Primary Care Provider.  ? ?________________________________________________________ ? ?The [facility] providers would like to encourage you to use [facility] to communicate with providers for non-urgent requests or questions.  Due to long hold times on the telephone, sending your provider a message by [facility] may be a faster and more efficient way to get a response.  Please allow 48 business hours for a response.  Please remember that this is for non-urgent requests.  ?_______________________________________________________ ? ?

## 2021-05-22 NOTE — Progress Notes (Signed)
? ?Chief Complaint: Follow-up GERD ? ?HPI: ?   Tamara Watson is a 51 year old African-American female with past medical history as listed below including GERD, known to Dr. Silverio Decamp, who presents to clinic today for refill of her Dexilant. ?   06/15/2019 colonoscopy with one less than 1 mm polyp in the cecum, one 5 mm polyp in the ascending colon, diverticulosis in the sigmoid colon nonbleeding internal hemorrhoids.  Pathology with sessile serrated polyp.  Repeat recommended in 5 years. ?   Today, the patient tells me that her insurance stopped paying for the [place] and she was tried on Protonix, Omeprazole and Nexium none of which helped her.  If she does not take this medicine she has daily reflux symptoms and a feeling of a globus sensation.  Tells me as long as she is on her [place] she does well. ?   Denies fever, chills, weight loss, abdominal pain or change in bowel habits. ? ?Past Medical History:  ?Diagnosis Date  ? Allergy   ? seasonal  ? Anxiety   ? in school only   ? Chronic constipation   ? Chronic kidney disease   ? kidney stones  ? Frequency of urination   ? GERD (gastroesophageal reflux disease)   ? History of sepsis   ? 10-29-2018  sepsis secondary to pyelonephritis  ? IBS (irritable colon syndrome)   ? IDA (iron deficiency anemia)   ? Left ureteral stone   ? Seasonal allergic rhinitis   ? ? ?Past Surgical History:  ?Procedure Laterality Date  ? BREAST LUMPECTOMY Left 2000 approx.  ? benign   ? CYSTOSCOPY  06/14/2018  ? Procedure: Cystoscopy;  Surgeon: Waymon Amato, MD;  Location: [facility];  Service: Gynecology;;  ? [place], URETEROSCOPY AND STENT PLACEMENT Left 11/23/2018  ? Procedure: CYSTOSCOPY WITH RETROGRADE PYELOGRAM, URETEROSCOPY AND STENT PLACEMENT: REMOVAL NEPHROSTOMY DRAINS X 2;  Surgeon: Cleon Gustin, MD;  [facility];  Service: Urology;  Laterality: Left;  ? DILATION AND CURETTAGE OF UTERUS  x2  last one 1997  ? HYSTERECTOMY  ABDOMINAL WITH SALPINGECTOMY Bilateral 06/14/2018  ? Procedure: HYSTERECTOMY ABDOMINAL WITH  BILATERAL SALPINGECTOMY;  Surgeon: Waymon Amato, MD;  Location: [facility];  Service: Gynecology;  Laterality: Bilateral;  ? IR NEPHROSTOMY EXCHANGE LEFT  11/03/2018  ? IR NEPHROSTOMY PLACEMENT LEFT  10/29/2018  ? IR RADIOLOGIST EVAL & MGMT  11/16/2018  ? TUBAL LIGATION Bilateral 2002  ? PPTL  ? ? ?Current Outpatient Medications  ?Medication Sig Dispense Refill  ? benzonatate (TESSALON) 100 MG capsule Take by mouth 3 (three) times daily as needed for cough.    ? cyclobenzaprine (FLEXERIL) 5 MG tablet Take 5 mg by mouth 3 (three) times daily as needed for muscle spasms. Take one at night-     ? fluticasone (FLONASE) 50 MCG/ACT nasal spray Place 2 sprays into both nostrils daily. (Patient taking differently: Place 2 sprays into both nostrils daily as needed for allergies.) 16 g 1  ? GEMTESA 75 MG TABS TAKE 1 TABLET BY MOUTH EVERY DAY 90 tablet 1  ? LINZESS 145 MCG CAPS capsule TAKE 1 CAPSULE (145 MCG TOTAL) BY MOUTH DAILY BEFORE BREAKFAST. 30 capsule 3  ? pramoxine-hydrocortisone (PROCTOCREAM-HC) 1-1 % rectal cream Place 1 application rectally 2 (two) times daily. 30 g 1  ? senna-docusate (SENOKOT-S) 8.6-50 MG tablet Take 2 tablets by mouth at bedtime. (Patient taking differently: Take 2 tablets by mouth at bedtime as needed.)    ? solifenacin (  VESICARE) 10 MG tablet TAKE 1 TABLET BY MOUTH EVERY DAY 90 tablet 1  ? vitamin C (ASCORBIC ACID) 500 MG tablet Take 500 mg by mouth daily as needed (immune system boost/ cold symptoms).     ? pantoprazole (PROTONIX) 40 MG tablet Take 1 tablet (40 mg total) by mouth daily. (Patient not taking: Reported on 05/22/2021) 30 tablet 0  ? ?Current Facility-Administered Medications  ?Medication Dose Route Frequency Provider Last Rate Last Admin  ? 0.9 %  sodium chloride infusion  500 mL Intravenous Once Nandigam, Venia Minks, MD      ? ? ?Allergies as of 05/22/2021  ? (No Known Allergies)  ? ? ?Family  History  ?Problem Relation Age of Onset  ? Cancer Maternal Aunt   ? Ovarian cancer Maternal Aunt   ? Cancer Paternal Grandmother   ? Pancreatic cancer Paternal Grandmother   ? Hyperlipidemia Mother   ? Diabetes Mother   ? Hyperlipidemia Father   ? Breast cancer Maternal Aunt   ? Colon cancer Neg Hx   ? Esophageal cancer Neg Hx   ? Stomach cancer Neg Hx   ? Rectal cancer Neg Hx   ? Colon polyps Neg Hx   ? ? ?Social History  ? ?Socioeconomic History  ? Marital status: Married  ?  Spouse name: Not on file  ? Number of children: 3  ? Years of education: Not on file  ? Highest education level: Not on file  ?Occupational History  ? Not on file  ?Tobacco Use  ? Smoking status: Never  ? Smokeless tobacco: Never  ?Vaping Use  ? Vaping Use: Never used  ?Substance and Sexual Activity  ? Alcohol use: Not Currently  ?  Alcohol/week: 1.0 standard drink  ?  Types: 1 Glasses of wine per week  ?  Comment: stopped 2022  ? Drug use: Never  ? Sexual activity: Yes  ?  Birth control/protection: Surgical  ?Other Topics Concern  ? Not on file  ?Social History Narrative  ? Not on file  ? ?Social Determinants of Health  ? ?Financial Resource Strain: Not on file  ?Food Insecurity: Not on file  ?Transportation Needs: Not on file  ?Physical Activity: Not on file  ?Stress: Not on file  ?Social Connections: Not on file  ?Intimate Partner Violence: Not on file  ? ? ?Review of Systems:    ?Constitutional: No weight loss, fever or chills ?Cardiovascular: No chest pain ?Respiratory: No SOB  ?Gastrointestinal: See HPI and otherwise negative ? ? Physical Exam:  ?Vital signs: ?BP 104/70   Pulse 84   Ht 5' 5.5" (1.664 m)   Wt 221 lb 8 oz (100.5 kg)   LMP 06/05/2018   BMI 36.30 kg/m?   ? ?Constitutional:   Pleasant AA female appears to be in NAD, Well developed, Well nourished, alert and cooperative ?Respiratory: Respirations even and unlabored. Lungs clear to auscultation bilaterally.   No wheezes, crackles, or rhonchi.  ?Cardiovascular: Normal S1,  S2. No MRG. Regular rate and rhythm. No peripheral edema, cyanosis or pallor.  ?Gastrointestinal:  Soft, nondistended, nontender. No rebound or guarding. Normal bowel sounds. No appreciable masses or hepatomegaly. ?Rectal:  Not performed.  ?Psychiatric: Demonstrates good judgement and reason without abnormal affect or behaviors. ? ?RELEVANT LABS AND IMAGING: ?CBC ?   ?Component Value Date/Time  ? WBC 3.9 12/24/2019 1046  ? WBC 8.1 11/08/2018 0331  ? RBC 4.93 12/24/2019 1046  ? RBC 3.48 (L) 11/08/2018 0331  ? HGB 14.8 12/24/2019 1046  ?  HCT 43.3 12/24/2019 1046  ? PLT 235 12/24/2019 1046  ? MCV 88 12/24/2019 1046  ? MCH 30.0 12/24/2019 1046  ? MCH 29.6 11/08/2018 0331  ? MCHC 34.2 12/24/2019 1046  ? MCHC 33.1 11/08/2018 0331  ? RDW 12.0 12/24/2019 1046  ? LYMPHSABS 2.0 12/24/2019 1046  ? MONOABS 0.9 11/05/2018 0710  ? EOSABS 0.0 12/24/2019 1046  ? BASOSABS 0.0 12/24/2019 1046  ? ? ?CMP  ?   ?Component Value Date/Time  ? NA 141 12/24/2019 1046  ? K 4.3 12/24/2019 1046  ? CL 103 12/24/2019 1046  ? CO2 24 12/24/2019 1046  ? GLUCOSE 82 12/24/2019 1046  ? GLUCOSE 97 11/23/2018 1106  ? BUN 13 12/24/2019 1046  ? CREATININE 0.98 12/24/2019 1046  ? CREATININE 0.82 05/24/2018 1413  ? CREATININE 0.90 03/01/2016 0922  ? CALCIUM 9.3 12/24/2019 1046  ? PROT 7.0 12/24/2019 1046  ? ALBUMIN 4.4 12/24/2019 1046  ? AST 14 12/24/2019 1046  ? AST 18 05/24/2018 1413  ? ALT 14 12/24/2019 1046  ? ALT 13 05/24/2018 1413  ? ALKPHOS 59 12/24/2019 1046  ? BILITOT 0.9 12/24/2019 1046  ? BILITOT 0.3 05/24/2018 1413  ? GFRNONAA 69 12/24/2019 1046  ? GFRNONAA >60 05/24/2018 1413  ? GFRNONAA 79 03/01/2016 0922  ? GFRAA 80 12/24/2019 1046  ? GFRAA >60 05/24/2018 1413  ? GFRAA >89 03/01/2016 0922  ? ? ?Assessment: ?1.  GERD: Well-controlled on Dexilant 60 mg when she is able to get this, otherwise symptoms of reflux and globus sensation ? ?Plan: ?1.  Refilled Dexilant 60 mg daily #90 with 3 refills.  If patient is doing well in a year she can have  another year-long refill. ?2.  Last colonoscopy 06/15/2019 with repeat recommended in 5 years.  She will be due in 2026. ?3.  Patient to follow in clinic with [place] as needed. ? ?(Side note.  Patient wanted noted that sh

## 2021-06-21 ENCOUNTER — Other Ambulatory Visit: Payer: Self-pay | Admitting: Gastroenterology

## 2021-06-25 NOTE — Progress Notes (Signed)
Reviewed and agree with documentation and assessment and plan. K. Veena Shaneta Cervenka , MD   

## 2021-07-18 ENCOUNTER — Other Ambulatory Visit: Payer: Self-pay | Admitting: Gastroenterology

## 2021-07-18 DIAGNOSIS — K219 Gastro-esophageal reflux disease without esophagitis: Secondary | ICD-10-CM

## 2021-08-14 ENCOUNTER — Other Ambulatory Visit: Payer: Self-pay | Admitting: Gastroenterology

## 2021-08-14 DIAGNOSIS — K219 Gastro-esophageal reflux disease without esophagitis: Secondary | ICD-10-CM

## 2021-10-13 ENCOUNTER — Other Ambulatory Visit: Payer: Self-pay | Admitting: Family Medicine

## 2021-10-13 DIAGNOSIS — M5430 Sciatica, unspecified side: Secondary | ICD-10-CM

## 2021-10-28 ENCOUNTER — Other Ambulatory Visit (HOSPITAL_BASED_OUTPATIENT_CLINIC_OR_DEPARTMENT_OTHER): Payer: Self-pay | Admitting: Family Medicine

## 2021-10-28 ENCOUNTER — Other Ambulatory Visit: Payer: Managed Care, Other (non HMO)

## 2021-10-28 DIAGNOSIS — M533 Sacrococcygeal disorders, not elsewhere classified: Secondary | ICD-10-CM

## 2021-11-03 ENCOUNTER — Ambulatory Visit (HOSPITAL_COMMUNITY): Payer: Managed Care, Other (non HMO)

## 2021-11-03 ENCOUNTER — Encounter (HOSPITAL_COMMUNITY): Payer: Self-pay

## 2021-11-06 ENCOUNTER — Other Ambulatory Visit (HOSPITAL_BASED_OUTPATIENT_CLINIC_OR_DEPARTMENT_OTHER): Payer: Self-pay | Admitting: Family Medicine

## 2021-11-06 ENCOUNTER — Ambulatory Visit (HOSPITAL_COMMUNITY): Payer: Managed Care, Other (non HMO)

## 2021-11-06 ENCOUNTER — Encounter (HOSPITAL_COMMUNITY): Payer: Self-pay

## 2021-11-06 DIAGNOSIS — M533 Sacrococcygeal disorders, not elsewhere classified: Secondary | ICD-10-CM

## 2021-11-10 ENCOUNTER — Ambulatory Visit (HOSPITAL_COMMUNITY)
Admission: RE | Admit: 2021-11-10 | Discharge: 2021-11-10 | Disposition: A | Discharge status: Home / Self Care | Payer: Managed Care, Other (non HMO) | Source: Ambulatory Visit | Attending: Family Medicine | Admitting: Family Medicine

## 2021-11-10 DIAGNOSIS — M533 Sacrococcygeal disorders, not elsewhere classified: Secondary | ICD-10-CM | POA: Insufficient documentation

## 2021-11-10 MED ORDER — IOHEXOL 350 MG/ML SOLN
75.0000 mL | Freq: Once | INTRAVENOUS | Status: AC | PRN
  Administered 2021-11-10: 75 mL via INTRAVENOUS

## 2021-11-13 ENCOUNTER — Other Ambulatory Visit: Payer: Managed Care, Other (non HMO)

## 2021-12-21 ENCOUNTER — Other Ambulatory Visit: Payer: Self-pay | Admitting: Urology

## 2022-02-02 ENCOUNTER — Other Ambulatory Visit: Payer: Self-pay | Admitting: Gastroenterology

## 2022-02-02 DIAGNOSIS — K219 Gastro-esophageal reflux disease without esophagitis: Secondary | ICD-10-CM

## 2022-04-06 ENCOUNTER — Other Ambulatory Visit: Payer: Self-pay | Admitting: Gastroenterology

## 2022-05-03 HISTORY — PX: STOMACH SURGERY: SHX791

## 2022-05-21 ENCOUNTER — Encounter: Payer: Self-pay | Admitting: Nurse Practitioner

## 2022-05-21 ENCOUNTER — Ambulatory Visit (INDEPENDENT_AMBULATORY_CARE_PROVIDER_SITE_OTHER): Payer: Managed Care, Other (non HMO) | Admitting: Nurse Practitioner

## 2022-05-21 ENCOUNTER — Other Ambulatory Visit: Payer: Self-pay | Admitting: Nurse Practitioner

## 2022-05-21 VITALS — BP 116/62 | HR 80 | Ht 65.0 in | Wt 213.0 lb

## 2022-05-21 DIAGNOSIS — R1110 Vomiting, unspecified: Secondary | ICD-10-CM

## 2022-05-21 DIAGNOSIS — K219 Gastro-esophageal reflux disease without esophagitis: Secondary | ICD-10-CM

## 2022-05-21 DIAGNOSIS — K5900 Constipation, unspecified: Secondary | ICD-10-CM | POA: Diagnosis not present

## 2022-05-21 MED ORDER — PANTOPRAZOLE SODIUM 40 MG PO TBEC: 40.0000 mg | DELAYED_RELEASE_TABLET | Freq: Two times a day (BID) | ORAL | 1 refills | Status: DC

## 2022-05-21 NOTE — Patient Instructions (Addendum)
Linzess 145 mcg- 1 by mouth daily. Take 30 minutes before breakfast  Fiber of your choice daily  Senna laxative-  take 2 tablets by mouth every 3rd night as needed  Follow up with Dr.Nandigam in 3 months.  Thank you for trusting me with your gastrointestinal care!   Alcide Evener, CRNP

## 2022-05-21 NOTE — Progress Notes (Unsigned)
05/21/2022 Tamara Watson [id number] 09-Oct-1970   Chief Complaint:  History of Present Illness: Tamara Watson is a 52 year old female with a past medical history of GERD and colon polyps. She is known by Dr. Lavon Paganini. Protonix and Nexium were not effective.   She has abdomianl bloat, feels tight, regurgitation bit by bit until she vomits. Last occurred 2 weeks ago. Few times monthly. Broccoli, cabbage, black eyed peas.   She has heartburn on and off, non heartburn in the past weekly.   She has Linzess every other day.   Eats breakfast later  She has loose stools on Linzess, skips a day.   She sometimes takes Metamucil if she       Latest Ref Rng & Units 12/24/2019   10:46 AM 11/23/2018   11:06 AM 11/08/2018    3:31 AM  CBC  WBC 3.4 - 10.8 x10E3/uL 3.9   8.1   Hemoglobin 11.1 - 15.9 g/dL 40.9  81.1  91.4   Hematocrit 34.0 - 46.6 % 43.3  37.0  31.1   Platelets 150 - 450 x10E3/uL 235   689        Latest Ref Rng & Units 12/24/2019   10:46 AM 11/23/2018   11:06 AM 11/08/2018    3:31 AM  CMP  Glucose 65 - 99 mg/dL 82  97  96   BUN 6 - 24 mg/dL 13  11  <5   Creatinine 0.57 - 1.00 mg/dL 7.82  9.56  2.13   Sodium 134 - 144 mmol/L 141  139  140   Potassium 3.5 - 5.2 mmol/L 4.3  3.7  3.5   Chloride 96 - 106 mmol/L 103  103  106   CO2 20 - 29 mmol/L 24   25   Calcium 8.7 - 10.2 mg/dL 9.3   8.4   Total Protein 6.0 - 8.5 g/dL 7.0     Total Bilirubin 0.0 - 1.2 mg/dL 0.9     Alkaline Phos 44 - 121 IU/L 59     AST 0 - 40 IU/L 14     ALT 0 - 32 IU/L 14       Colonoscopy 06/13/2019: - One less than 1 mm polyp in the cecum, removed with a cold biopsy forceps. Resected and retrieved.  - One 5 mm polyp in the ascending colon, removed with a cold snare. Resected and retrieved.  - Diverticulosis in the sigmoid colon.  - Non-bleeding internal hemorrhoids.  - The examination was otherwise normal  EGD 07/29/2017: - Z-line regular.  - No endoscopic esophageal  abnormality to explain patient's dysphagia. Biopsied.  - Normal stomach.  - Normal examined duodenum.  Yeast from [place]. [place] treatment.    Current Medications, Allergies, Past Medical History, Past Surgical History, Family History and Social History were reviewed in [employer] record.   Review of Systems:   Constitutional: Negative for fever, sweats, chills or weight loss.  Respiratory: Negative for shortness of breath.   Cardiovascular: Negative for chest pain, palpitations and leg swelling.  Gastrointestinal: See HPI.  Musculoskeletal: Negative for back pain or muscle aches.  Neurological: Negative for dizziness, headaches or paresthesias.    Physical Exam: LMP 06/05/2018  General: in no acute distress. Head: Normocephalic and atraumatic. Eyes: No scleral icterus. Conjunctiva pink . Ears: Normal auditory acuity. Mouth: Dentition intact. No ulcers or lesions.  Lungs: Clear throughout to auscultation. Heart: Regular rate and rhythm, no murmur. Abdomen: Soft, nontender and nondistended.  No masses or hepatomegaly. Normal bowel sounds x 4 quadrants.  Rectal: *** Musculoskeletal: Symmetrical with no gross deformities. Extremities: No edema. Neurological: Alert oriented x 4. No focal deficits.  Psychological: Alert and cooperative. Normal mood and affect  Assessment and Recommendations: ***

## 2022-05-23 ENCOUNTER — Encounter: Payer: Self-pay | Admitting: Nurse Practitioner

## 2022-09-15 ENCOUNTER — Encounter: Payer: Self-pay | Admitting: Gastroenterology

## 2022-09-15 ENCOUNTER — Ambulatory Visit (INDEPENDENT_AMBULATORY_CARE_PROVIDER_SITE_OTHER): Payer: Managed Care, Other (non HMO) | Admitting: Gastroenterology

## 2022-09-15 VITALS — BP 108/68 | HR 70 | Ht 65.5 in | Wt 202.0 lb

## 2022-09-15 DIAGNOSIS — K2100 Gastro-esophageal reflux disease with esophagitis, without bleeding: Secondary | ICD-10-CM

## 2022-09-15 DIAGNOSIS — R140 Abdominal distension (gaseous): Secondary | ICD-10-CM | POA: Diagnosis not present

## 2022-09-15 DIAGNOSIS — K581 Irritable bowel syndrome with constipation: Secondary | ICD-10-CM | POA: Diagnosis not present

## 2022-09-15 DIAGNOSIS — R112 Nausea with vomiting, unspecified: Secondary | ICD-10-CM

## 2022-09-15 MED ORDER — DICYCLOMINE HCL 10 MG PO CAPS: 10.0000 mg | ORAL_CAPSULE | Freq: Three times a day (TID) | ORAL | 2 refills | Status: DC | PRN

## 2022-09-15 MED ORDER — PANTOPRAZOLE SODIUM 40 MG PO TBEC: 40.0000 mg | DELAYED_RELEASE_TABLET | Freq: Every day | ORAL | 3 refills | Status: DC

## 2022-09-15 MED ORDER — ONDANSETRON 4 MG PO TBDP: 4.0000 mg | ORAL_TABLET | Freq: Every day | ORAL | 1 refills | Status: AC | PRN

## 2022-09-15 MED ORDER — LINACLOTIDE 145 MCG PO CAPS: 145.0000 ug | ORAL_CAPSULE | Freq: Every day | ORAL | 3 refills | Status: DC

## 2022-09-15 NOTE — Progress Notes (Signed)
Tamara Watson    [id number]    18-Nov-1970  Primary Care Physician:[name], Adelfa Koh, MD  Referring Physician: Gaspar Garbe, MD [address] [place],  [place] [address]   Chief complaint: GERD, regurgitation, nausea vomiting, bloating  HPI: 52 year old very pleasant female with history of chronic GERD and IBS here for follow-up visit  On and off reflux symptoms, mostly depends on what she eats (broccolli, cabage, beans, milk products) triggers her symptoms more.  She feels full and bloated, develops regurgitation and, nausea followed by projectile vomiting, has these episodes about 2-3 times a month  She has been taking weight loss medication since Oct 2023, she was having the symptoms even prior to start of the weight loss medication  She takes Myalanta and Gas X as needed with some improvement. She is taking Protonix 40mg  daily.  When she does not take she has severe heartburn and reflux symptoms.    Continues to have intermittent constipation despite Linzess 145 mcg daily, has sensation of incomplete evacuation.  She has to take herbal laxative with senna  daily and is having daily bowel movements.    Denies any odynophagia, dysphagia, melena or blood per rectum.   No family history of colon cancer.   EGD July 29, 2017: Normal exam.  Esophageal biopsies consistent with reflux esophagitis and negative for eosinophilic esophagitis.    Colonoscopy Jun 13, 2019: - One less than 1 mm polyp in the cecum, removed with a cold biopsy forceps. Resected and retrieved. (SSP) - One 5 mm polyp in the ascending colon, removed with a cold snare. Resected and retrieved. (SSP)  - Diverticulosis in the sigmoid colon. - Non-bleeding internal hemorrhoids. - The examination was otherwise normal.   Outpatient Encounter Medications as of 09/15/2022  Medication Sig   benzonatate (TESSALON) 100 MG capsule Take by mouth 3 (three) times daily as needed for cough.    cyclobenzaprine (FLEXERIL) 5 MG tablet Take 5 mg by mouth 3 (three) times daily as needed for muscle spasms. Take one at night-    LINZESS 145 MCG CAPS capsule TAKE 1 CAPSULE BY MOUTH EVERY DAY BEFORE BREAKFAST   pantoprazole (PROTONIX) 40 MG tablet TAKE 1 TABLET BY MOUTH EVERY DAY IN THE MORNING   pantoprazole (PROTONIX) 40 MG tablet Take 1 tablet (40 mg total) by mouth 2 (two) times daily.   QSYMIA 11.25-69 MG CP24 Take 1 capsule by mouth every morning.   senna-docusate (SENOKOT-S) 8.6-50 MG tablet Take 2 tablets by mouth at bedtime. (Patient taking differently: Take 2 tablets by mouth at bedtime as needed.)   solifenacin (VESICARE) 10 MG tablet TAKE 1 TABLET BY MOUTH EVERY DAY   vitamin C (ASCORBIC ACID) 500 MG tablet Take 500 mg by mouth daily as needed (immune system boost/ cold symptoms).    [DISCONTINUED] fluticasone (FLONASE) 50 MCG/ACT nasal spray Place 2 sprays into both nostrils daily. (Patient taking differently: Place 2 sprays into both nostrils daily as needed for allergies.)   Facility-Administered Encounter Medications as of 09/15/2022  Medication   0.9 %  sodium chloride infusion    Allergies as of 09/15/2022   (No Known Allergies)    Past Medical History:  Diagnosis Date   Allergy    seasonal   Anxiety    in school only    Chronic constipation    Chronic kidney disease    kidney stones   Frequency of urination    GERD (gastroesophageal reflux disease)  History of sepsis    10-29-2018  sepsis secondary to pyelonephritis   IBS (irritable colon syndrome)    IDA (iron deficiency anemia)    Left ureteral stone    Seasonal allergic rhinitis     Past Surgical History:  Procedure Laterality Date   BREAST LUMPECTOMY Left 2000 approx.   benign    CYSTOSCOPY  06/14/2018   Procedure: Cystoscopy;  Surgeon: Hoover Browns, MD;  Location: [facility] OR;  Service: Gynecology;;   CYSTOSCOPY WITH RETROGRADE PYELOGRAM, URETEROSCOPY AND STENT PLACEMENT Left 11/23/2018   Procedure:  CYSTOSCOPY WITH RETROGRADE PYELOGRAM, URETEROSCOPY AND STENT PLACEMENT: REMOVAL NEPHROSTOMY DRAINS X 2;  Surgeon: Malen Gauze, MD;  Location: [facility];  Service: Urology;  Laterality: Left;   DILATION AND CURETTAGE OF UTERUS  x2  last one 1997   HYSTERECTOMY ABDOMINAL WITH SALPINGECTOMY Bilateral 06/14/2018   Procedure: HYSTERECTOMY ABDOMINAL WITH  BILATERAL SALPINGECTOMY;  Surgeon: Hoover Browns, MD;  Location: [facility] OR;  Service: Gynecology;  Laterality: Bilateral;   IR NEPHROSTOMY EXCHANGE LEFT  11/03/2018   IR NEPHROSTOMY PLACEMENT LEFT  10/29/2018   IR RADIOLOGIST EVAL & MGMT  11/16/2018   STOMACH SURGERY  05/03/2022   TUBAL LIGATION Bilateral 2002   PPTL    Family History  Problem Relation Age of Onset   Cancer Maternal Aunt    Ovarian cancer Maternal Aunt    Cancer Paternal Grandmother    Pancreatic cancer Paternal Grandmother    Hyperlipidemia Mother    Diabetes Mother    Hyperlipidemia Father    Breast cancer Maternal Aunt    Colon cancer Neg Hx    Esophageal cancer Neg Hx    Stomach cancer Neg Hx    Rectal cancer Neg Hx    Colon polyps Neg Hx     Social History   Socioeconomic History   Marital status: Married    Spouse name: Not on file   Number of children: 3   Years of education: Not on file   Highest education level: Not on file  Occupational History   Not on file  Tobacco Use   Smoking status: Never   Smokeless tobacco: Never  Vaping Use   Vaping status: Never Used  Substance and Sexual Activity   Alcohol use: Not Currently    Alcohol/week: 1.0 standard drink of alcohol    Types: 1 Glasses of wine per week    Comment: stopped 2022   Drug use: Never   Sexual activity: Yes    Birth control/protection: Surgical  Other Topics Concern   Not on file  Social History Narrative   Not on file   Social Determinants of Health   Financial Resource Strain: Not on file  Food Insecurity: Not on file  Transportation Needs: Not on file   Physical Activity: Not on file  Stress: Not on file  Social Connections: Unknown (09/01/2021)   Received from [facility], [facility]   Social Network    Social Network: Not on file  Intimate Partner Violence: Unknown (09/01/2021)   Received from [facility], [facility]   HITS    Physically Hurt: Not on file    Insult or Talk Down To: Not on file    Threaten Physical Harm: Not on file    Scream or Curse: Not on file      Review of systems: All other review of systems negative except as mentioned in the HPI.   Physical Exam: There were no vitals filed for this visit. There  is no height or weight on file to calculate BMI. Gen:      No acute distress HEENT:  sclera anicteric Abd:      soft, non-tender; no palpable masses, no distension Ext:    No edema Neuro: alert and oriented x 3 Psych: normal mood and affect  Data Reviewed:  Reviewed labs, radiology imaging, old records and pertinent past GI work up   Assessment and Plan/Recommendations:  52 year old female with history of chronic GERD, reflux esophagitis and irritable bowel syndrome predominant constipation   GERD: Continue pantoprazole 40 mg daily Discussed antireflux measures and lifestyle modifications   IBS predominant constipation: Continue Linzess dose to 145 mcg daily   Bloating, belching and excessive flatulence: Continue with lactose-free diet and avoid foods that create excess gas Use Gas-X/simethicone as needed Use IBgard 1 capsule up to 3 times daily as needed  Use dicyclomine 10 mg up to 3 times daily every 8 hours as needed for severe abdominal bloating or cramping  Nausea and vomiting: Unclear etiology triggered by certain foods, continue to avoid Use Zofran ODT 4 mg daily as needed for severe nausea  Dyspepsia: Use FD guard 02/21/1949 times daily as needed  This visit required 40 minutes of patient care (this includes precharting, chart review, review of results, face-to-face time used  for counseling as well as treatment plan and follow-up. The patient was provided an opportunity to ask questions and all were answered. The patient agreed with the plan and demonstrated an understanding of the instructions.  Iona Beard , MD    CC: [name], Adelfa Koh, MD

## 2022-09-15 NOTE — Patient Instructions (Addendum)
We have sent the following medications to your pharmacy for you to pick up at your convenience: Dicyclomine,Zofran, Linzess 145 mcg, Protonix 40 mg   FDgard and IBgard samples given, these can be purchased over the counter at your local pharmacy   _______________________________________________________  If your blood pressure at your visit was 140/90 or greater, please contact your primary care physician to follow up on this.  _______________________________________________________  If you are age 17 or older, your body mass index should be between 23-30. Your Body mass index is 33.1 kg/m. If this is out of the aforementioned range listed, please consider follow up with your Primary Care Provider.  If you are age 33 or younger, your body mass index should be between 19-25. Your Body mass index is 33.1 kg/m. If this is out of the aformentioned range listed, please consider follow up with your Primary Care Provider.   ________________________________________________________  The [facility] providers would like to encourage you to use [facility] to communicate with providers for non-urgent requests or questions.  Due to long hold times on the telephone, sending your provider a message by [facility] may be a faster and more efficient way to get a response.  Please allow 48 business hours for a response.  Please remember that this is for non-urgent requests.  _______________________________________________________   I appreciate the  opportunity to care for you  Thank You   Marsa Aris , MD

## 2022-10-09 ENCOUNTER — Other Ambulatory Visit: Payer: Self-pay | Admitting: Urology

## 2022-12-18 ENCOUNTER — Other Ambulatory Visit: Payer: Self-pay | Admitting: Gastroenterology

## 2023-01-21 ENCOUNTER — Ambulatory Visit: Payer: Managed Care, Other (non HMO) | Admitting: Gastroenterology

## 2023-04-29 ENCOUNTER — Ambulatory Visit (INDEPENDENT_AMBULATORY_CARE_PROVIDER_SITE_OTHER): Payer: Managed Care, Other (non HMO) | Admitting: Gastroenterology

## 2023-04-29 ENCOUNTER — Encounter: Payer: Self-pay | Admitting: Gastroenterology

## 2023-04-29 VITALS — BP 110/66 | HR 72 | Ht 65.5 in | Wt 208.0 lb

## 2023-04-29 DIAGNOSIS — K5904 Chronic idiopathic constipation: Secondary | ICD-10-CM

## 2023-04-29 DIAGNOSIS — K5900 Constipation, unspecified: Secondary | ICD-10-CM

## 2023-04-29 DIAGNOSIS — Z860101 Personal history of adenomatous and serrated colon polyps: Secondary | ICD-10-CM

## 2023-04-29 DIAGNOSIS — K219 Gastro-esophageal reflux disease without esophagitis: Secondary | ICD-10-CM

## 2023-04-29 MED ORDER — FAMOTIDINE 20 MG PO TABS: 20.0000 mg | ORAL_TABLET | Freq: Every day | ORAL | 2 refills | Status: DC

## 2023-04-29 MED ORDER — PANTOPRAZOLE SODIUM 40 MG PO TBEC: 40.0000 mg | DELAYED_RELEASE_TABLET | Freq: Every day | ORAL | 3 refills | Status: AC

## 2023-04-29 MED ORDER — LINACLOTIDE 145 MCG PO CAPS: 145.0000 ug | ORAL_CAPSULE | Freq: Every day | ORAL | 3 refills | Status: AC

## 2023-04-29 NOTE — Patient Instructions (Addendum)
 VISIT SUMMARY:  During your visit, we discussed your ongoing issues with gastroesophageal reflux disease (GERD) and throat irritation, as well as your well-managed constipation and history of precancerous colon polyps.  YOUR PLAN:  -GASTROESOPHAGEAL REFLUX DISEASE (GERD): GERD is a condition where stomach acid frequently flows back into the tube connecting your mouth and stomach, causing irritation. Your symptoms are stable with pantoprazole, but you experience occasional heartburn and throat irritation. Continue taking pantoprazole 40 mg once daily before breakfast. We are adding famotidine at bedtime, especially on days when symptoms are worse. Elevate your head during sleep and avoid late-night meals. If symptoms persist, we may consider an endoscopy. You might also try antihistamines like Allegra or Xyzal for possible sinus drainage or allergies.  -CONSTIPATION: Constipation is having fewer than three bowel movements a week or having difficulty passing stools. Your constipation is well-managed with your current regimen of lenses and Senna, allowing for regular bowel movements without issues. No changes are needed at this time.  -PRECANCEROUS COLON POLYPS: Precancerous colon polyps are growths on the lining of your colon that could develop into cancer over time. Your last colonoscopy was in 2021, and you are due for a follow-up colonoscopy in May 2026. Please ensure this is scheduled.  INSTRUCTIONS:  Please continue with your current medications and follow the new recommendations for managing your GERD symptoms. Schedule your follow-up colonoscopy for May 2026. If your symptoms persist or worsen, please contact our office for further evaluation.  I appreciate the  opportunity to care for you  Thank You   Marsa Aris , MD

## 2023-04-29 NOTE — Progress Notes (Signed)
 Tamara Watson    [id number]    07-06-1970  Primary Care Physician:[name], Adelfa Koh, MD  Referring Physician: Gaspar Garbe, MD [address] [place],  [place] [address]   Chief complaint:  GERD  Discussed the use of AI scribe software for clinical note transcription with the patient, who gave verbal consent to proceed.  History of Present Illness   Tamara Watson "[name]" is a 53 year old female with gastroesophageal reflux disease who presents with persistent throat irritation and cough.  Gastroesophageal reflux disease symptoms are somewhat stable with pantoprazole 40 mg once daily. She experiences breakthrough heartburn approximately once a week. No trouble swallowing, but there is a persistent sensation of something stuck in her throat, prompting frequent swallowing, which she attributes to mucus. This sensation is constant and sometimes accompanied by coughing, especially when consuming dry foods like nuts. The issue has been ongoing but not worsening. Occasionally, she wakes up at night with a sensation that could be related to sinus drainage or acid reflux.  Constipation is well-managed with the use of lenses and Senna, allowing for daily bowel movements without diarrhea or blood in the stool. No stomach pains are reported.  She has a history of precancerous colon polyps and is due for a colonoscopy in May 2026, following her last procedure in 2021.        CT Pelvis with contrast November 10, 2021 1. Normal CT appearance of the sacrum and coccyx. No acute findings or explanation for pain. 2. Bilateral ovarian cysts, 3.8 cm on the left and 1.1 cm on the right. No internal complexity. No follow-up imaging is recommended. EGD July 29, 2017: Normal exam.  Esophageal biopsies consistent with reflux esophagitis and negative for eosinophilic esophagitis.     Colonoscopy Jun 13, 2019: - One less than 1 mm polyp in the cecum, removed with a cold  biopsy forceps. Resected and retrieved. (SSP) - One 5 mm polyp in the ascending colon, removed with a cold snare. Resected and retrieved. (SSP)  - Diverticulosis in the sigmoid colon. - Non-bleeding internal hemorrhoids. - The examination was otherwise normal.  Outpatient Encounter Medications as of 04/29/2023  Medication Sig   benzonatate (TESSALON) 100 MG capsule Take by mouth 3 (three) times daily as needed for cough.   cyclobenzaprine (FLEXERIL) 5 MG tablet Take 5 mg by mouth 3 (three) times daily as needed for muscle spasms. Take one at night-    dicyclomine (BENTYL) 10 MG capsule TAKE 1 CAPSULE (10 MG TOTAL) BY MOUTH EVERY 8 (EIGHT) HOURS AS NEEDED FOR SPASMS.   famotidine (PEPCID) 20 MG tablet Take 1 tablet (20 mg total) by mouth at bedtime.   ondansetron (ZOFRAN-ODT) 4 MG disintegrating tablet Take 1 tablet (4 mg total) by mouth daily as needed for nausea or vomiting.   QSYMIA 11.25-69 MG CP24 Take 1 capsule by mouth every morning.   senna-docusate (SENOKOT-S) 8.6-50 MG tablet Take 2 tablets by mouth at bedtime. (Patient taking differently: Take 2 tablets by mouth at bedtime as needed.)   solifenacin (VESICARE) 10 MG tablet TAKE 1 TABLET BY MOUTH EVERY DAY   vitamin C (ASCORBIC ACID) 500 MG tablet Take 500 mg by mouth daily as needed (immune system boost/ cold symptoms).    [DISCONTINUED] linaclotide (LINZESS) 145 MCG CAPS capsule Take 1 capsule (145 mcg total) by mouth daily before breakfast. (Patient taking differently: Take 145 mcg by mouth as needed.)   [DISCONTINUED] pantoprazole (PROTONIX)  40 MG tablet Take 1 tablet (40 mg total) by mouth daily.   linaclotide (LINZESS) 145 MCG CAPS capsule Take 1 capsule (145 mcg total) by mouth daily before breakfast.   pantoprazole (PROTONIX) 40 MG tablet Take 1 tablet (40 mg total) by mouth daily.   Facility-Administered Encounter Medications as of 04/29/2023  Medication   0.9 %  sodium chloride infusion    Allergies as of 04/29/2023   (No  Known Allergies)    Past Medical History:  Diagnosis Date   Allergy    seasonal   Anxiety    in school only    Chronic constipation    Chronic kidney disease    kidney stones   Frequency of urination    GERD (gastroesophageal reflux disease)    History of sepsis    10-29-2018  sepsis secondary to pyelonephritis   IBS (irritable colon syndrome)    IDA (iron deficiency anemia)    Left ureteral stone    Seasonal allergic rhinitis     Past Surgical History:  Procedure Laterality Date   BREAST LUMPECTOMY Left 2000 approx.   benign    CYSTOSCOPY  06/14/2018   Procedure: Cystoscopy;  Surgeon: Hoover Browns, MD;  Location: [facility] OR;  Service: Gynecology;;   CYSTOSCOPY WITH RETROGRADE PYELOGRAM, URETEROSCOPY AND STENT PLACEMENT Left 11/23/2018   Procedure: CYSTOSCOPY WITH RETROGRADE PYELOGRAM, URETEROSCOPY AND STENT PLACEMENT: REMOVAL NEPHROSTOMY DRAINS X 2;  Surgeon: Malen Gauze, MD;  Location: [facility];  Service: Urology;  Laterality: Left;   DILATION AND CURETTAGE OF UTERUS  x2  last one 1997   HYSTERECTOMY ABDOMINAL WITH SALPINGECTOMY Bilateral 06/14/2018   Procedure: HYSTERECTOMY ABDOMINAL WITH  BILATERAL SALPINGECTOMY;  Surgeon: Hoover Browns, MD;  Location: [facility] OR;  Service: Gynecology;  Laterality: Bilateral;   IR NEPHROSTOMY EXCHANGE LEFT  11/03/2018   IR NEPHROSTOMY PLACEMENT LEFT  10/29/2018   IR RADIOLOGIST EVAL & MGMT  11/16/2018   STOMACH SURGERY  05/03/2022   TUBAL LIGATION Bilateral 2002   PPTL    Family History  Problem Relation Age of Onset   Cancer Maternal Aunt    Ovarian cancer Maternal Aunt    Cancer Paternal Grandmother    Pancreatic cancer Paternal Grandmother    Hyperlipidemia Mother    Diabetes Mother    Hyperlipidemia Father    Breast cancer Maternal Aunt    Colon cancer Neg Hx    Esophageal cancer Neg Hx    Stomach cancer Neg Hx    Rectal cancer Neg Hx    Colon polyps Neg Hx     Social History   Socioeconomic History    Marital status: Married    Spouse name: Not on file   Number of children: 3   Years of education: Not on file   Highest education level: Not on file  Occupational History   Not on file  Tobacco Use   Smoking status: Never   Smokeless tobacco: Never  Vaping Use   Vaping status: Never Used  Substance and Sexual Activity   Alcohol use: Not Currently    Alcohol/week: 1.0 standard drink of alcohol    Types: 1 Glasses of wine per week    Comment: stopped 2022   Drug use: Never   Sexual activity: Yes    Birth control/protection: Surgical  Other Topics Concern   Not on file  Social History Narrative   Not on file   Social Drivers of Health   Financial Resource Strain: Not on file  Food  Insecurity: Not on file  Transportation Needs: Not on file  Physical Activity: Not on file  Stress: Not on file  Social Connections: Unknown (09/01/2021)   Received [facility], [facility]   Social Network    Social Network: Not on file  Intimate Partner Violence: Unknown (09/01/2021)   Received from [facility], [facility]   HITS    Physically Hurt: Not on file    Insult or Talk Down To: Not on file    Threaten Physical Harm: Not on file    Scream or Curse: Not on file      Review of systems: All other review of systems negative except as mentioned in the HPI.   Physical Exam: Vitals:   04/29/23 0844  BP: 110/66  Pulse: 72   Body mass index is 34.09 kg/m. Gen:      No acute distress HEENT:  sclera anicteric CV: s1s2 rrr, no murmur Lungs: B/l clear. Abd:      soft, non-tender; no palpable masses, no distension Ext:    No edema Neuro: alert and oriented x 3 Psych: normal mood and affect  Data Reviewed:  Reviewed labs, radiology imaging, old records and pertinent past GI work up     Assessment and Plan    Gastroesophageal Reflux Disease (GERD) GERD symptoms are well-managed with pantoprazole 40 mg once daily, though she experiences breakthrough heartburn  approximately once a week. She describes a sensation of something stuck in the throat, frequent swallowing, and coughing triggered by dry foods like nuts. Differential includes acid reflux and possible sinus drainage or allergies. Potential for esophagitis or narrowing was discussed, but symptoms are not worsening. - Continue pantoprazole 40 mg once daily before breakfast. - Add famotidine at bedtime, especially on days with worse symptoms. - Advise elevating head during sleep and avoiding late-night meals. - Consider endoscopy if symptoms persist despite treatment. - Consider antihistamines like Allegra or Xyzal for possible sinus drainage or allergies.  Constipation Constipation is well-managed with current regimen of lenses and Senna. Reports regular bowel movements without diarrhea or hematochezia.  Precancerous Colon Polyps Precancerous colon polyps with last colonoscopy in 2021. Due for follow-up colonoscopy in 2026 as per previous recommendation. - Schedule colonoscopy for May 2026.          This visit required >30 minutes of patient care (this includes precharting, chart review, review of results, face-to-face time used for counseling as well as treatment plan and follow-up. The patient was provided an opportunity to ask questions and all were answered. The patient agreed with the plan and demonstrated an understanding of the instructions.  Iona Beard , MD    CC: [name], Adelfa Koh, MD

## 2023-05-02 ENCOUNTER — Encounter: Payer: Self-pay | Admitting: Gastroenterology

## 2023-06-17 ENCOUNTER — Other Ambulatory Visit: Payer: Self-pay | Admitting: Urology

## 2023-07-30 ENCOUNTER — Other Ambulatory Visit: Payer: Self-pay | Admitting: Gastroenterology

## 2024-02-22 ENCOUNTER — Emergency Department (HOSPITAL_COMMUNITY)

## 2024-02-22 ENCOUNTER — Emergency Department (HOSPITAL_COMMUNITY)
Admission: EM | Admit: 2024-02-22 | Discharge: 2024-02-22 | Disposition: A | Discharge status: Home / Self Care | Attending: Emergency Medicine | Admitting: Emergency Medicine

## 2024-02-22 ENCOUNTER — Other Ambulatory Visit: Payer: Self-pay | Admitting: Urology

## 2024-02-22 DIAGNOSIS — R1031 Right lower quadrant pain: Secondary | ICD-10-CM | POA: Insufficient documentation

## 2024-02-22 DIAGNOSIS — R1011 Right upper quadrant pain: Secondary | ICD-10-CM | POA: Insufficient documentation

## 2024-02-22 DIAGNOSIS — N390 Urinary tract infection, site not specified: Secondary | ICD-10-CM

## 2024-02-22 LAB — URINALYSIS, W/ REFLEX TO CULTURE (INFECTION SUSPECTED)
Bilirubin Urine: NEGATIVE
Glucose, UA: NEGATIVE mg/dL
Ketones, ur: NEGATIVE mg/dL
Nitrite: NEGATIVE
Protein, ur: NEGATIVE mg/dL
Specific Gravity, Urine: 1.029 (ref 1.005–1.030)
pH: 6 (ref 5.0–8.0)

## 2024-02-22 LAB — CBC WITH DIFFERENTIAL/PLATELET
Abs Immature Granulocytes: 0.02 K/uL (ref 0.00–0.07)
Basophils Absolute: 0 K/uL (ref 0.0–0.1)
Basophils Relative: 0 %
Eosinophils Absolute: 0 K/uL (ref 0.0–0.5)
Eosinophils Relative: 0 %
HCT: 41.4 % (ref 36.0–46.0)
Hemoglobin: 13.9 g/dL (ref 12.0–15.0)
Immature Granulocytes: 0 %
Lymphocytes Relative: 15 %
Lymphs Abs: 1.2 K/uL (ref 0.7–4.0)
MCH: 29.4 pg (ref 26.0–34.0)
MCHC: 33.6 g/dL (ref 30.0–36.0)
MCV: 87.7 fL (ref 80.0–100.0)
Monocytes Absolute: 0.7 K/uL (ref 0.1–1.0)
Monocytes Relative: 9 %
Neutro Abs: 6.1 K/uL (ref 1.7–7.7)
Neutrophils Relative %: 76 %
Platelets: 178 K/uL (ref 150–400)
RBC: 4.72 MIL/uL (ref 3.87–5.11)
RDW: 11.9 % (ref 11.5–15.5)
WBC: 8.1 K/uL (ref 4.0–10.5)
nRBC: 0 % (ref 0.0–0.2)

## 2024-02-22 LAB — COMPREHENSIVE METABOLIC PANEL WITH GFR
ALT: 52 U/L — ABNORMAL HIGH (ref 0–44)
AST: 33 U/L (ref 15–41)
Albumin: 4 g/dL (ref 3.5–5.0)
Alkaline Phosphatase: 80 U/L (ref 38–126)
Anion gap: 11 (ref 5–15)
BUN: 9 mg/dL (ref 6–20)
CO2: 25 mmol/L (ref 22–32)
Calcium: 9.4 mg/dL (ref 8.9–10.3)
Chloride: 105 mmol/L (ref 98–111)
Creatinine, Ser: 1.14 mg/dL — ABNORMAL HIGH (ref 0.44–1.00)
GFR, Estimated: 58 mL/min — ABNORMAL LOW
Glucose, Bld: 89 mg/dL (ref 70–99)
Potassium: 3.7 mmol/L (ref 3.5–5.1)
Sodium: 141 mmol/L (ref 135–145)
Total Bilirubin: 0.9 mg/dL (ref 0.0–1.2)
Total Protein: 7.3 g/dL (ref 6.5–8.1)

## 2024-02-22 LAB — LIPASE, BLOOD: Lipase: 61 U/L — ABNORMAL HIGH (ref 11–51)

## 2024-02-22 MED ORDER — SODIUM CHLORIDE 0.9 % IV SOLN
1.0000 g | INTRAVENOUS | Status: DC
  Administered 2024-02-22: 1 g via INTRAVENOUS
  Filled 2024-02-22: qty 10

## 2024-02-22 MED ORDER — OXYCODONE-ACETAMINOPHEN 5-325 MG PO TABS: 1.0000 | ORAL_TABLET | Freq: Four times a day (QID) | ORAL | 0 refills | Status: AC | PRN

## 2024-02-22 MED ORDER — MORPHINE SULFATE (PF) 4 MG/ML IV SOLN
6.0000 mg | Freq: Once | INTRAVENOUS | Status: AC
  Administered 2024-02-22: 6 mg via INTRAVENOUS
  Filled 2024-02-22: qty 2

## 2024-02-22 MED ORDER — LACTATED RINGERS IV SOLN: INTRAVENOUS | Status: DC

## 2024-02-22 MED ORDER — IOHEXOL 300 MG/ML  SOLN
100.0000 mL | Freq: Once | INTRAMUSCULAR | Status: AC | PRN
  Administered 2024-02-22: 100 mL via INTRAVENOUS

## 2024-02-22 MED ORDER — METOCLOPRAMIDE HCL 5 MG/ML IJ SOLN
5.0000 mg | Freq: Once | INTRAMUSCULAR | Status: AC
  Administered 2024-02-22: 5 mg via INTRAVENOUS
  Filled 2024-02-22: qty 2

## 2024-02-22 NOTE — ED Triage Notes (Signed)
 Patient reports low abdominal pain/right flank pain since Friday, subjective fevers, denies dysuria. Patient is alert and oriented x 4. Airway patent, respirations even and unlabored. Skin normal, warm and dry.

## 2024-02-22 NOTE — ED Provider Notes (Signed)
 " [place] [facility] AT [facility] Provider Note   CSN: [id number] Arrival date & time: 02/22/24  0845     Patient presents with: Abdominal Pain   Tamara Watson is a 54 y.o. female.   54 year old female presents with pain to her right flank and right upper quadrant times several days that initially was colicky but has not been constant.  Endorses low-grade temperature with some hematuria at first which is since resolved.  Does have history of kidney stones on the left side.  Saw her physician yesterday in the office and had a urinalysis done which showed 5-10 white cells and 5-10 red cells per my review.  Patient was started on ciprofloxacin.  Patient states the pain is not worse with eating and has been persistent.  She is status post hysterectomy.  Denies any GYN complaints at this time.       Prior to Admission medications  Medication Sig Start Date End Date Taking? Authorizing Provider  benzonatate  (TESSALON ) 100 MG capsule Take by mouth 3 (three) times daily as needed for cough.    [provider]  cyclobenzaprine  (FLEXERIL ) 5 MG tablet Take 5 mg by mouth 3 (three) times daily as needed for muscle spasms. Take one at night-     [provider]  dicyclomine  (BENTYL ) 10 MG capsule TAKE 1 CAPSULE (10 MG TOTAL) BY MOUTH EVERY 8 (EIGHT) HOURS AS NEEDED FOR SPASMS. 12/20/22   Nandigam, Kavitha V, MD  famotidine  (PEPCID ) 20 MG tablet TAKE 1 TABLET BY MOUTH EVERYDAY AT BEDTIME 08/01/23   Nandigam, Kavitha V, MD  linaclotide  (LINZESS ) 145 MCG CAPS capsule Take 1 capsule (145 mcg total) by mouth daily before breakfast. 04/29/23   Nandigam, Kavitha V, MD  ondansetron  (ZOFRAN -ODT) 4 MG disintegrating tablet Take 1 tablet (4 mg total) by mouth daily as needed for nausea or vomiting. 09/15/22   Nandigam, Kavitha V, MD  pantoprazole  (PROTONIX ) 40 MG tablet Take 1 tablet (40 mg total) by mouth daily. 04/29/23   Nandigam, Kavitha V, MD  QSYMIA 11.25-69 MG  CP24 Take 1 capsule by mouth every morning. 08/11/22   [provider]  senna-docusate (SENOKOT-S) 8.6-50 MG tablet Take 2 tablets by mouth at bedtime. Patient taking differently: Take 2 tablets by mouth at bedtime as needed. 11/09/18   Will Almarie MATSU, MD  solifenacin (VESICARE) 10 MG tablet TAKE 1 TABLET BY MOUTH EVERY DAY 06/20/23   McKenzie, Belvie CROME, MD  vitamin C (ASCORBIC ACID) 500 MG tablet Take 500 mg by mouth daily as needed (immune system boost/ cold symptoms).     [provider]    Allergies: Patient has no known allergies.    Review of Systems  All other systems reviewed and are negative.   Updated Vital Signs BP (!) 141/87 (BP Location: Left Arm)   Pulse (!) 105   Temp 98.2 F (36.8 C) (Oral)   Resp 16   LMP 06/05/2018   SpO2 100%   Physical Exam Vitals and nursing note reviewed.  Constitutional:      General: She is not in acute distress.    Appearance: Normal appearance. She is well-developed. She is not toxic-appearing.  HENT:     Head: Normocephalic and atraumatic.  Eyes:     General: Lids are normal.     Conjunctiva/sclera: Conjunctivae normal.     Pupils: Pupils are equal, round, and reactive to light.  Neck:     Thyroid: No thyroid mass.  Trachea: No tracheal deviation.  Cardiovascular:     Rate and Rhythm: Normal rate and regular rhythm.     Heart sounds: Normal heart sounds. No murmur heard.    No gallop.  Pulmonary:     Effort: Pulmonary effort is normal. No respiratory distress.     Breath sounds: Normal breath sounds. No stridor. No decreased breath sounds, wheezing, rhonchi or rales.  Abdominal:     General: There is no distension.     Palpations: Abdomen is soft.     Tenderness: There is abdominal tenderness in the right upper quadrant and right lower quadrant. There is no rebound.   Musculoskeletal:        General: No tenderness. Normal range of motion.     Cervical back: Normal range of motion and neck supple.   Skin:    General: Skin is warm and dry.     Findings: No abrasion or rash.  Neurological:     Mental Status: She is alert and oriented to person, place, and time. Mental status is at baseline.     GCS: GCS eye subscore is 4. GCS verbal subscore is 5. GCS motor subscore is 6.     Cranial Nerves: No cranial nerve deficit.     Sensory: No sensory deficit.     Motor: Motor function is intact.  Psychiatric:        Attention and Perception: Attention normal.        Speech: Speech normal.        Behavior: Behavior normal.     (all labs ordered are listed, but only abnormal results are displayed) Labs Reviewed  CBC WITH DIFFERENTIAL/PLATELET  COMPREHENSIVE METABOLIC PANEL WITH GFR  LIPASE, BLOOD  URINALYSIS, W/ REFLEX TO CULTURE (INFECTION SUSPECTED)    EKG: None  Radiology: No results found.   Procedures   Medications Ordered in the ED  lactated ringers  infusion (has no administration in time range)  morphine  (PF) 4 MG/ML injection 6 mg (has no administration in time range)  metoCLOPramide  (REGLAN ) injection 5 mg (has no administration in time range)                                    Medical Decision Making Amount and/or Complexity of Data Reviewed Labs: ordered. Radiology: ordered.  Risk Prescription drug management.   Patient treated with morphine  and Reglan  and feels better.  Also given IV fluids.  She had a urinalysis was did not show any severe infection.  Her CT abdomen pelvis was concerning for pyelonephritis.  She is already on ciprofloxacin we will continue this.  She does not appear to be septic at this time.  Will add opiate for pain management at home.  Return precautions given     Final diagnoses:  None    ED Discharge Orders     None          Dasie Faden, MD 02/22/24 1340  "

## 2024-05-16 ENCOUNTER — Ambulatory Visit: Admitting: Urology
